# Patient Record
Sex: Female | Born: 1945 | Race: White | Hispanic: No | Smoking: Never smoker
Health system: Southern US, Community
[De-identification: ages and names within clinical notes are randomized; demographics above are authoritative.]

## PROBLEM LIST (undated history)

## (undated) DIAGNOSIS — E039 Hypothyroidism, unspecified: Secondary | ICD-10-CM

## (undated) DIAGNOSIS — F329 Major depressive disorder, single episode, unspecified: Secondary | ICD-10-CM

## (undated) DIAGNOSIS — D649 Anemia, unspecified: Secondary | ICD-10-CM

## (undated) DIAGNOSIS — M797 Fibromyalgia: Secondary | ICD-10-CM

## (undated) DIAGNOSIS — F419 Anxiety disorder, unspecified: Secondary | ICD-10-CM

## (undated) DIAGNOSIS — N189 Chronic kidney disease, unspecified: Secondary | ICD-10-CM

## (undated) DIAGNOSIS — F32A Depression, unspecified: Secondary | ICD-10-CM

## (undated) DIAGNOSIS — M1990 Unspecified osteoarthritis, unspecified site: Secondary | ICD-10-CM

## (undated) DIAGNOSIS — I10 Essential (primary) hypertension: Secondary | ICD-10-CM

## (undated) HISTORY — PX: APPENDECTOMY: SHX54

## (undated) HISTORY — PX: ABDOMINAL HYSTERECTOMY: SHX81

## (undated) HISTORY — PX: BACK SURGERY: SHX140

---

## 1989-03-25 HISTORY — PX: BREAST REDUCTION SURGERY: SHX8

## 2003-08-27 ENCOUNTER — Encounter
Admission: RE | Admit: 2003-08-27 | Discharge: 2003-11-25 | Payer: Self-pay | Admitting: Physical Medicine and Rehabilitation

## 2003-12-03 ENCOUNTER — Encounter
Admission: RE | Admit: 2003-12-03 | Discharge: 2004-03-02 | Payer: Self-pay | Admitting: Physical Medicine and Rehabilitation

## 2004-03-01 ENCOUNTER — Encounter
Admission: RE | Admit: 2004-03-01 | Discharge: 2004-04-13 | Payer: Self-pay | Admitting: Physical Medicine and Rehabilitation

## 2004-03-26 ENCOUNTER — Ambulatory Visit: Payer: Self-pay | Admitting: Physical Medicine & Rehabilitation

## 2004-04-13 ENCOUNTER — Encounter
Admission: RE | Admit: 2004-04-13 | Discharge: 2004-07-12 | Payer: Self-pay | Admitting: Physical Medicine and Rehabilitation

## 2004-04-14 ENCOUNTER — Inpatient Hospital Stay (HOSPITAL_COMMUNITY): Admission: EM | Admit: 2004-04-14 | Discharge: 2004-04-15 | Payer: Self-pay | Admitting: Emergency Medicine

## 2004-04-14 ENCOUNTER — Ambulatory Visit: Payer: Self-pay | Admitting: Physical Medicine and Rehabilitation

## 2004-04-15 ENCOUNTER — Encounter (INDEPENDENT_AMBULATORY_CARE_PROVIDER_SITE_OTHER): Payer: Self-pay | Admitting: Cardiology

## 2005-05-23 ENCOUNTER — Inpatient Hospital Stay (HOSPITAL_COMMUNITY): Admission: RE | Admit: 2005-05-23 | Discharge: 2005-06-01 | Payer: Self-pay | Admitting: Psychiatry

## 2005-05-23 ENCOUNTER — Encounter: Payer: Self-pay | Admitting: Emergency Medicine

## 2005-05-24 ENCOUNTER — Ambulatory Visit: Payer: Self-pay | Admitting: Psychiatry

## 2005-07-12 ENCOUNTER — Ambulatory Visit: Payer: Self-pay | Admitting: Family Medicine

## 2005-10-21 ENCOUNTER — Emergency Department (HOSPITAL_COMMUNITY): Admission: EM | Admit: 2005-10-21 | Discharge: 2005-10-21 | Payer: Self-pay | Admitting: Emergency Medicine

## 2005-10-24 ENCOUNTER — Inpatient Hospital Stay (HOSPITAL_COMMUNITY): Admission: RE | Admit: 2005-10-24 | Discharge: 2005-10-31 | Payer: Self-pay | Admitting: Psychiatry

## 2005-10-25 ENCOUNTER — Ambulatory Visit: Payer: Self-pay | Admitting: Psychiatry

## 2005-12-12 ENCOUNTER — Ambulatory Visit (HOSPITAL_COMMUNITY): Payer: Self-pay | Admitting: Psychiatry

## 2006-03-20 ENCOUNTER — Ambulatory Visit (HOSPITAL_COMMUNITY): Payer: Self-pay | Admitting: Psychiatry

## 2006-06-12 ENCOUNTER — Ambulatory Visit (HOSPITAL_COMMUNITY): Payer: Self-pay | Admitting: Psychiatry

## 2006-09-04 ENCOUNTER — Ambulatory Visit (HOSPITAL_COMMUNITY): Payer: Self-pay | Admitting: Psychiatry

## 2006-12-13 ENCOUNTER — Ambulatory Visit (HOSPITAL_COMMUNITY): Payer: Self-pay | Admitting: Psychiatry

## 2007-01-15 ENCOUNTER — Ambulatory Visit (HOSPITAL_COMMUNITY): Payer: Self-pay | Admitting: Psychiatry

## 2007-02-10 ENCOUNTER — Emergency Department (HOSPITAL_COMMUNITY): Admission: EM | Admit: 2007-02-10 | Discharge: 2007-02-10 | Payer: Self-pay | Admitting: Emergency Medicine

## 2007-03-05 ENCOUNTER — Ambulatory Visit (HOSPITAL_COMMUNITY): Payer: Self-pay | Admitting: Psychiatry

## 2007-04-18 ENCOUNTER — Encounter: Admission: RE | Admit: 2007-04-18 | Discharge: 2007-04-18 | Payer: Self-pay | Admitting: Neurosurgery

## 2007-06-06 ENCOUNTER — Inpatient Hospital Stay (HOSPITAL_COMMUNITY): Admission: RE | Admit: 2007-06-06 | Discharge: 2007-06-08 | Payer: Self-pay | Admitting: Neurosurgery

## 2007-07-23 ENCOUNTER — Encounter: Admission: RE | Admit: 2007-07-23 | Discharge: 2007-07-25 | Payer: Self-pay | Admitting: Neurosurgery

## 2007-07-26 ENCOUNTER — Encounter: Admission: RE | Admit: 2007-07-26 | Discharge: 2007-07-26 | Payer: Self-pay | Admitting: Neurosurgery

## 2008-05-05 ENCOUNTER — Encounter: Admission: RE | Admit: 2008-05-05 | Discharge: 2008-05-05 | Payer: Self-pay | Admitting: Neurosurgery

## 2008-07-29 ENCOUNTER — Inpatient Hospital Stay (HOSPITAL_COMMUNITY): Admission: RE | Admit: 2008-07-29 | Discharge: 2008-08-02 | Payer: Self-pay | Admitting: Neurosurgery

## 2008-12-02 ENCOUNTER — Encounter: Admission: RE | Admit: 2008-12-02 | Discharge: 2008-12-02 | Payer: Self-pay | Admitting: Neurosurgery

## 2009-01-02 ENCOUNTER — Inpatient Hospital Stay (HOSPITAL_COMMUNITY): Admission: RE | Admit: 2009-01-02 | Discharge: 2009-01-03 | Payer: Self-pay | Admitting: Neurosurgery

## 2010-08-15 ENCOUNTER — Encounter: Payer: Self-pay | Admitting: Neurosurgery

## 2010-11-01 LAB — CBC
HCT: 33.7 % — ABNORMAL LOW (ref 36.0–46.0)
MCHC: 33.3 g/dL (ref 30.0–36.0)
RBC: 3.76 MIL/uL — ABNORMAL LOW (ref 3.87–5.11)
RDW: 14.4 % (ref 11.5–15.5)
WBC: 6.6 10*3/uL (ref 4.0–10.5)

## 2010-11-01 LAB — BASIC METABOLIC PANEL
BUN: 20 mg/dL (ref 6–23)
CO2: 27 mEq/L (ref 19–32)
Calcium: 9.1 mg/dL (ref 8.4–10.5)
Chloride: 105 mEq/L (ref 96–112)
Glucose, Bld: 78 mg/dL (ref 70–99)

## 2010-11-08 LAB — TYPE AND SCREEN: Antibody Screen: NEGATIVE

## 2010-11-08 LAB — ABO/RH: ABO/RH(D): A POS

## 2010-11-11 IMAGING — CT CT CERVICAL SPINE W/O CM
4 of 5 series · 15 of 33 positions shown, 18 images · non-contrast
Comparison: Radiographs 12/02/2008

CLINICAL DATA: Neck pain

CT CERVICAL SPINE WITHOUT CONTRAST
TECHNIQUE: Multidetector CT imaging of the cervical spine was
performed. Multiplanar CT image reconstructions were also
generated.

[Series 4: bone windows · axial · 0.23mm/px · z∈[+118,+174]mm · 2 of 135 slices shown]
[im 45/135  bone]
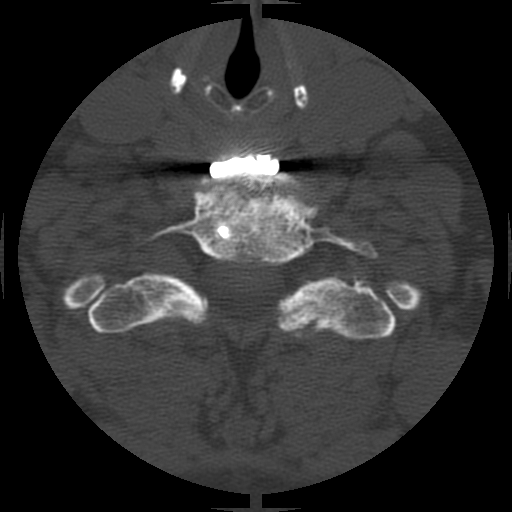
[im 90/135  bone]
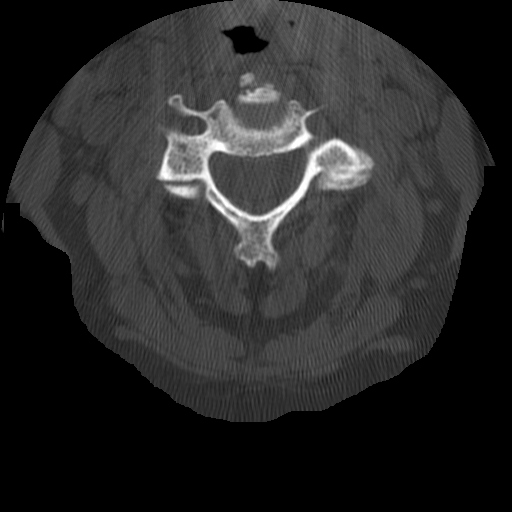

[Series 200: coronal · coronal · 0.34mm/px · 3 of 40 slices shown]
[im 8/40  bone]
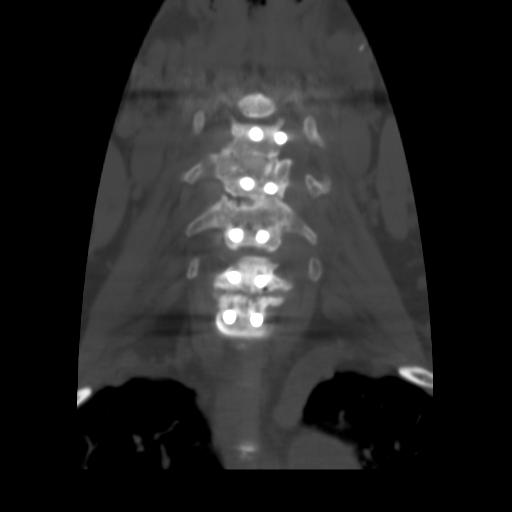
[im 16/40  bone]
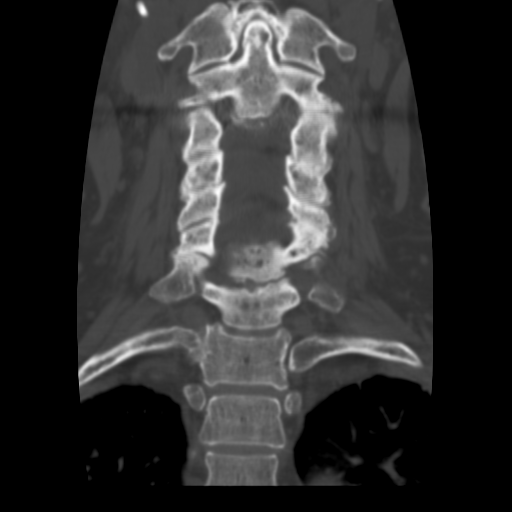
[im 24/40  bone]
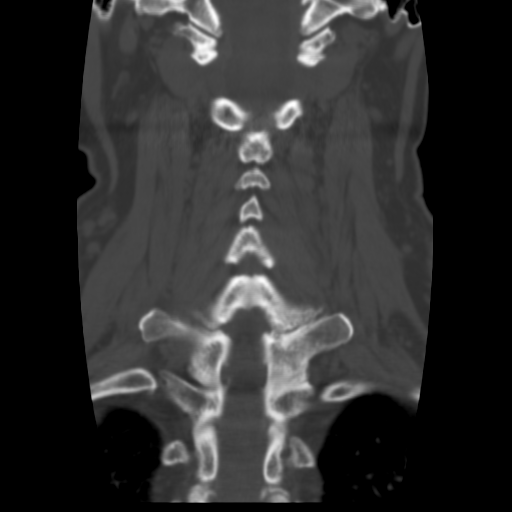

[Series 201: sagittal · sagittal · 0.34mm/px · 5 of 40 slices shown, 6 images]
[im 14/40  bone]
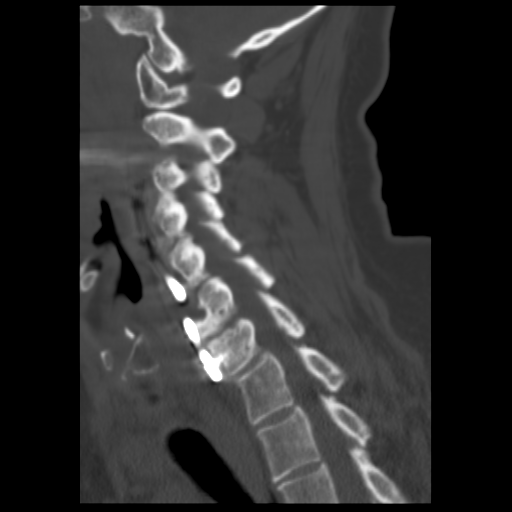
[im 17/40  bone]
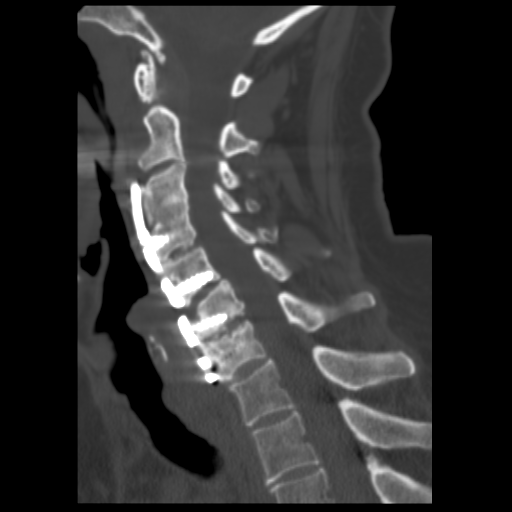
[im 20/40  soft-tissue]
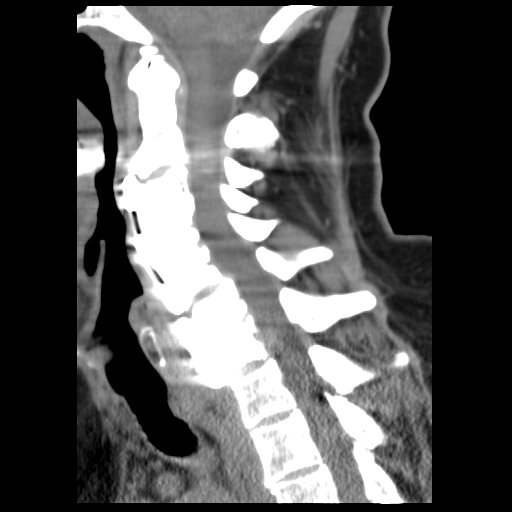
[im 20/40  bone]
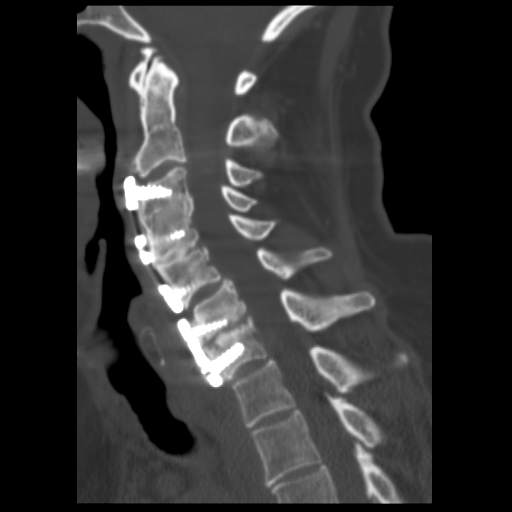
[im 23/40  bone]
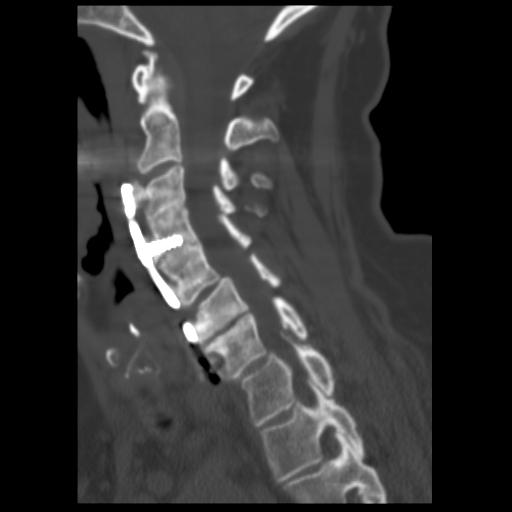
[im 27/40  bone]
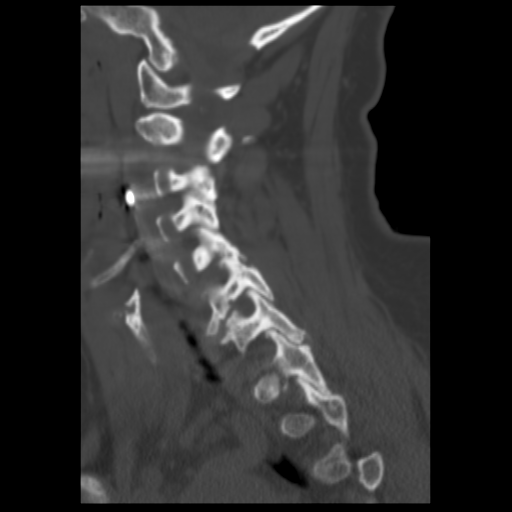

[Series 202: angled axial · axial · 0.20mm/px · z∈[+70,+179]mm · 5 of 248 slices shown, 7 images]
[im 42/248  soft-tissue]
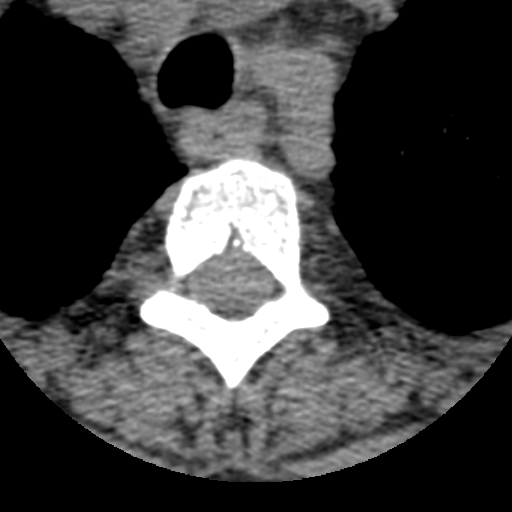
[im 42/248  bone]
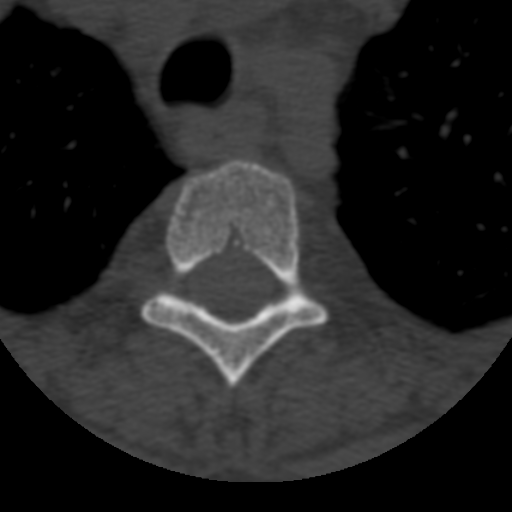
[im 83/248  bone]
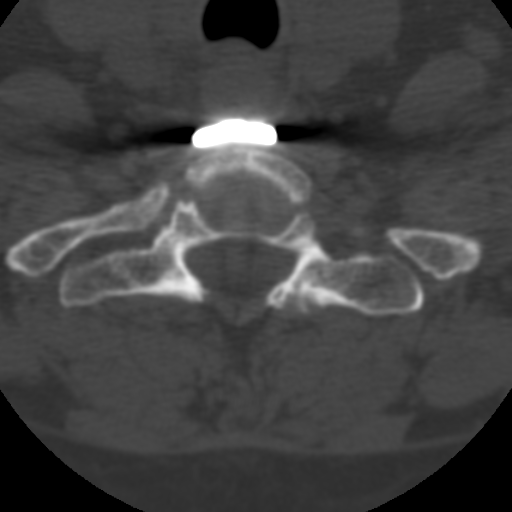
[im 124/248  bone]
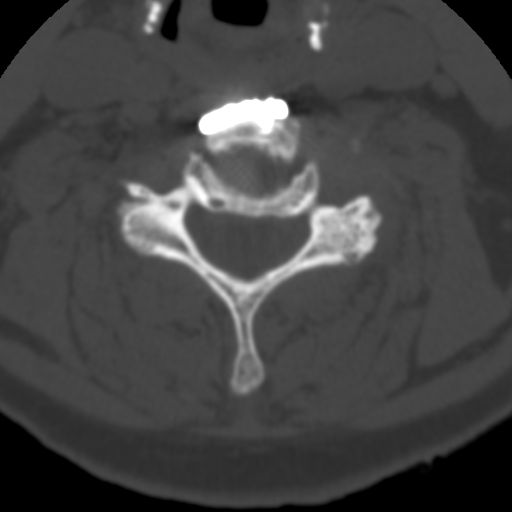
[im 165/248  bone]
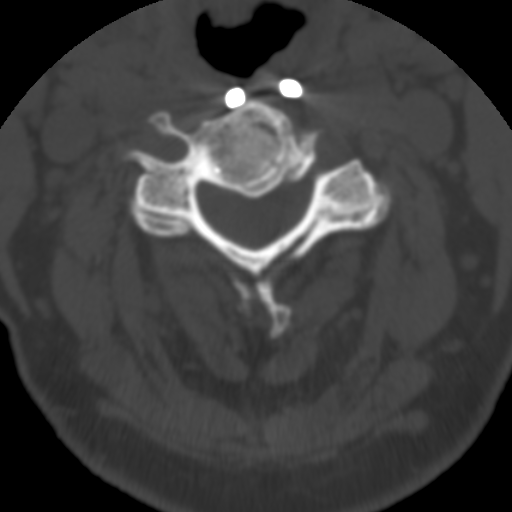
[im 206/248  soft-tissue]
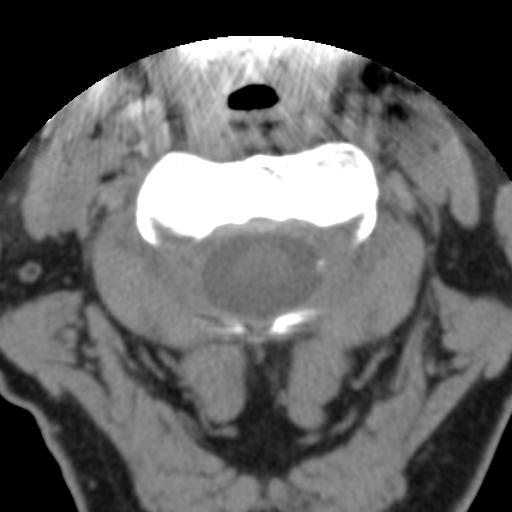
[im 206/248  bone]
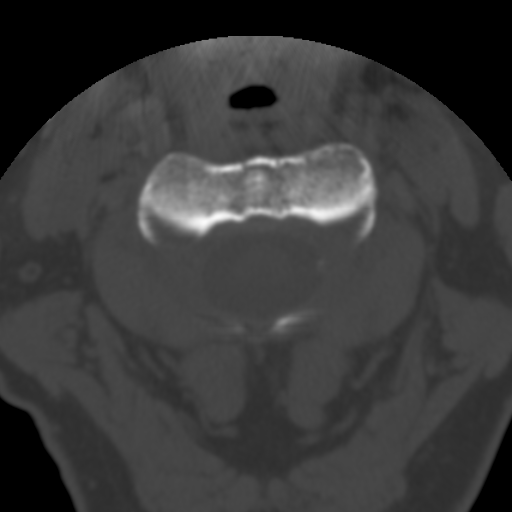

[15 of 33 positions shown; findings below may reference images not displayed]

FINDINGS: The patient has undergone cervical fusion, C3-C4, C4-C5
with anterior screw plate fixation.  The hardware is intact without
evidence for loosening.  Interbody bone plug at C3-C4 is completely
incorporated.  Incomplete bony incorporation of the bone plug at C4-
C5.

Cervical fusion is also identified at C6-C7.  The screws are well
positioned without loosening.  No bone plug is identified.

Diffuse facet degenerative changes.  No appreciable osseous
foraminal encroachment.

Axial imaging: The individual disc spaces were examined as follows:

C2-3: Normal

C3-4: Hardware well positioned.  No central or foraminal stenosis

C4-5: Fusion hardware satisfactory position.  Facet hypertrophic
changes.  No appreciable foraminal stenosis

C5-6: Uncinate spurring with facet degenerative changes.  Mild left
foraminal narrowing secondary to facet hypertrophy.

C6-7: Uncinate spurring without stenosis.

C7-T1: Mild facet degenerative change.
IMPRESSION: Fusion hardware in satisfactory position.  Incomplete bony
incorporation interbody bone plug at C4-C5.

No bone plug is identified at C6-C7.

## 2010-12-07 NOTE — H&P (Signed)
NAME:  Christine Burns, Christine Burns                  ACCOUNT NO.:  [id number]   MEDICAL RECORD NO.:  [id number]          PATIENT TYPE:  INP   LOCATION:  3018                         FACILITY:  [facility]   PHYSICIAN:  Hilda Lias, M.D.   DATE OF BIRTH:  13-Sep-1945   DATE OF ADMISSION:  06/06/2007  DATE OF DISCHARGE:                              HISTORY & PHYSICAL   HISTORY:  Ms. Christine Burns is a lady who was seen by me almost 2 years ago  complaining of neck pain and also lower back pain.  The pain goes to the  right leg.  It radiates up to her neck, and the pain goes to both arms,  left worse than the right one.  She has increase of pain with flexion of  the cervical spine.  Because of that, we went ahead and did an MRI which  was not helpful, and we proceed with a cervical myelogram and because of  the findings, she is being admitted for surgery.   PAST MEDICAL HISTORY:  She had femoral bypass.   ALLERGIES:  She is allergic to DURAGESIC, VICODIN, AMBIEN, and  NEURONTIN.   FAMILY HISTORY:  Unremarkable.   REVIEW OF SYSTEMS:  Positive high blood pressure, neck pain, back pain,  anxiety, depression, thyroid disease, and fibromyalgia.   PHYSICAL EXAMINATION:  HEAD, EARS, NOSE AND THROAT:  Normal.  NECK:  She has a decreased flexibility of the cervical spine secondary  to pain.  LUNGS:  Clear.  HEART:  Sounds normal.  EXTREMITIES:  Normal pulses.  NEURO:  Mental status normal, cranial nerves normal.  Strength:  She has  weakness  of the deltoid and the triceps.  Reflexes 1+.  No Babinski.  Pinprick  sensation both arms.  The cervical MRI myelogram showed that  she has a spondylosis at the level of C3-4, C4-5 and C6-7.  C5-6 is  completely normal.   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   RECOMMENDATIONS:  The patient wants to proceed with surgery.  The  procedure will be a decompression at the level of C3-4, C4-5, and C6-7,  skipping C5-6.  She knows about the risks  such as infection, CSF leak,  damage to the vocal cord, stroke, paralysis, and need for surgery.           ______________________________  Hilda Lias, M.D.     EB/MEDQ  D:  06/06/2007  T:  06/07/2007  Job:  [id number]

## 2010-12-07 NOTE — Op Note (Signed)
NAME:  Christine Burns, Christine Burns                  ACCOUNT NO.:  [id number]   MEDICAL RECORD NO.:  [id number]          PATIENT TYPE:  INP   LOCATION:  3010                         FACILITY:  [facility]   PHYSICIAN:  Hilda Lias, M.D.   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   PROCEDURE:  Bilateral L2, L3, L4, L5 laminectomies, foraminotomy,  decompression of the thecal sac, posterolateral arthrodesis with  autograft.   SURGEON:  Hilda Lias, MD   ASSISTANT:  Danae Orleans. Venetia Maxon, MD   CLINICAL HISTORY:  Mrs. Bambach is a 65 year old female complaining of  back pain with radiation to both the legs.  X-rays show severe stenosis  from L2-L3 down to L5-S1.  Surgery was advised.  She has failed with  conservative treatment.   PROCEDURE:  The patient was taken to the OR and after intubation, she  was positioned in a prone manner.  The skin was cleaned with DuraPrep.  We were able to count the spinous process and midline incision was done  from L2 down to L5-S1.  Muscle was retracted all the way laterally to  the junction between the facet and the transverse process.  Then with  the [place], we removed the spinous process of L5, L4, L3, and L2.  With  the drill, we did bilateral laminectomies preserving the facet.  Immediately, we found that the patient has only stenosis, but taking up  the yellow ligament compromising the canal.  Removal of the yellow  ligament was accomplished and at the end using the L2, L3, L4 related to  Kerrison punch, we did a foraminotomy to decompress the L2 down to L5-S1  nerve root.  Good hemostasis was achieved.  Then with the drill, we  drilled the lateral aspect of the facet from L2-3, L3-4, L4-5, and L5-  S1.   Autograft was used for arthrodesis.  Valsalva maneuver was  negative.  The area was irrigated.  Fentanyl was left in the epidural  space and the wound was closed with Vicryl and Steri-Strips.           ______________________________  Hilda Lias, M.D.     EB/MEDQ  D:  07/29/2008  T:  07/30/2008  Job:  [id number]

## 2010-12-07 NOTE — H&P (Signed)
NAME:  Christine Burns                  ACCOUNT NO.:  [id number]   MEDICAL RECORD NO.:  [id number]          PATIENT TYPE:  INP   LOCATION:  2899                         FACILITY:  [facility]   PHYSICIAN:  Hilda Lias, M.D.   DATE OF BIRTH:  1945/10/04   DATE OF ADMISSION:  01/02/2009  DATE OF DISCHARGE:                              HISTORY & PHYSICAL   Christine Burns is a lady who underwent cervical fusion at the level of 3-4, 4-  5, and 6-7.  Later on she had lumbar decompression.  For the past few  months, she had been complaining of neck pain radiation to the shoulder  and the pain gets worse every time she moved.  She had failed  conservative treatment.  X-rays showed that the area where she had  fusion at the level C3-C4 and C4-C5, and at the C6-C7 she developed  pseudoarthrosis.  Because of continuous pain, she is being admitted for  surgery.   PAST MEDICAL HISTORY:  Lumbar laminectomy, anterior cervical fusion,  femoral bypass.   ALLERGIES:  She is allergic to AMBIEN, VICODIN, DURAGESIC PATCH,  MORPHINE, VALIUM, NEURONTIN.   FAMILY HISTORY:  Unremarkable.   REVIEW OF SYSTEMS:  Positive for high blood pressure, anxiety,  depression, thyroid disease, fibromyalgia.   PHYSICAL EXAMINATION:  HEAD, EYES, EARS, NOSE, AND THROAT:  Normal.  NECK:  She has anterior scar __________ going to the shoulder.  LUNGS:  Clear.  HEART:  Sounds normal.  __________ pulse.  She has a scar in the lumbar area.  NEURO:  She is oriented x3.  There is no weakness.  Reflex is  symmetrical.   X-rays showed that she has pseudoarthrosis at the level C6-7.   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  Status post fusion 3-4, 4-5.  Lumbar laminectomy for neurogenic claudication.  For this, the patient  is being admitted for surgery.  Procedure will be removal of the  __________ fusion.  We tried to use Apligraf in her hip and she  declined.  The surgery was fully explained to her with the same risks as  before  including the possibility of no improvement.           ______________________________  Hilda Lias, M.D.     EB/MEDQ  D:  01/02/2009  T:  01/02/2009  Job:  [id number]

## 2010-12-07 NOTE — Op Note (Signed)
NAME:  Christine Burns, Christine Burns                  ACCOUNT NO.:  [id number]   MEDICAL RECORD NO.:  [id number]          PATIENT TYPE:  INP   LOCATION:  3017                         FACILITY:  [facility]   PHYSICIAN:  Hilda Lias, M.D.   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   PROCEDURES:  Removal of the previous C6-7 plate, takedown of the  pseudoarthrosis at the level of C6-7, fusion with 9-mm allograft with  autograft inside plus BMX plate, Z6-1, microscope.   SURGEON:  Hilda Lias, MD   CLINICAL HISTORY:  Ms. Grosvenor is a lady who underwent 3-4, 4-5, and 6-7  fusion about 3 years ago.  The patient did well, but lately she had been  complaining of neck pain mostly with activity.  X-rays shows solid  fusion at the level of 3-4 and 4-5.  She developed pseudoarthrosis at  the level of C6-7.  Surgery was advised in view of no improvement.   PROCEDURE:  The patient was taken to the OR and after intubation, the  left side of the neck was cleaned with DuraPrep.  Transverse incision  following the previous wound was made through the skin, subcutaneous  tissue, and platysma.  Dissection was carried out all the way down until  we found the plate at the level of C6-7.  Lysis of adhesions was done.  The plate and the 4 screws were removed.  Then, with the help of the  microscope and doing an x-ray, we localized the area of pseudoarthrosis.  Indeed, there was quite a bit of fibrosis and no bone activity  whatsoever.  With the drill as well as the microcurette,  we were able  to remove the whole fibrosis all the way down to the spinal cord.  Foraminotomy was accomplished.  Then, the endplates were drilled until  we were able to see fresh bleeding coming  through.  Then, an allograft  of 9 mm, lordotic with autograft inside as well as BMX was inserted, and  this was followed by using a new plate and 4 screws.  Lateral cervical  spine showed good position of the plate and screws.  Once we  accomplished hemostasis, the wound was closed with Vicryl and Steri-  Strips.  The patient is going to go to PACU and she is going to be  wearing Aspen brace for at least 8 weeks.           ______________________________  Hilda Lias, M.D.     EB/MEDQ  D:  01/02/2009  T:  01/02/2009  Job:  [id number]

## 2010-12-07 NOTE — H&P (Signed)
NAME:  Yackel, Rosealee                  ACCOUNT NO.:  [id number]   MEDICAL RECORD NO.:  [id number]          PATIENT TYPE:  INP   LOCATION:  3010                         FACILITY:  [facility]   PHYSICIAN:  Hilda Lias, M.D.   DATE OF BIRTH:  01/31/1946   DATE OF ADMISSION:  07/29/2008  DATE OF DISCHARGE:                              HISTORY & PHYSICAL   Ms. Ronning is a lady who had been coming to my office complaining of back  pain with pain going to both her legs, especially with walking.  At the  point that when she walks about a block she had to sit, immediately  develop quite a bit of pain in both legs.  This lady in the past  underwent anterior decompression of the cervical area.  By x-ray, we  know that she has stenosis, which was severe from T4 down to L5-S1.   PAST MEDICAL HISTORY:  Anterior cervical decompression.  Femoral bypass.   ALLERGIES:  The patient is allergic to VICODIN, DURAGESIC, AMBIEN, and  NEURONTIN.   FAMILY HISTORY:  Unremarkable.   REVIEW OF SYSTEMS:  Positive for high blood pressure, back pain,  anxiety, thyroid disease, fibromyalgia.   PHYSICAL EXAMINATION:  HEAD, EARS, NOSE and THROAT:  Normal.  NECK:  She has anterior scar in the neck.  LUNGS:  Clear.  HEART:  Heart sounds normal.  EXTREMITIES: There were normal strength and normal pulse.  NEUROLOGICAL:  She has a weakness which is in both legs with femoral  stretch maneuver, positive bilateral, lumbar spine x-rays showed severe  stenosis at T3-4, T4-5, and T5-1.   IMPRESSION:  Lumbar stenosis with neurogenic claudication.   RECOMMENDATIONS:  The patient will be admitted for surgery.  The  procedure would be bilateral T4-5 laminectomy, foraminotomy, and  possible posterolateral arthrodesis with autograft.  The patient knows  about the risks of infection, CSF leak, worsening pain, paralysis, need  for surgery.           ______________________________  Hilda Lias, M.D.     EB/MEDQ  D:   07/29/2008  T:  07/29/2008  Job:  [id number]

## 2010-12-07 NOTE — Discharge Summary (Signed)
NAME:  Christine Burns, Christine Burns                  ACCOUNT NO.:  [id number]   MEDICAL RECORD NO.:  [id number]          PATIENT TYPE:  INP   LOCATION:  3017                         FACILITY:  [facility]   PHYSICIAN:  Coletta Memos, M.D.     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   PROCEDURE:  Redo fusion at C6-7 with anterior instrumentation and  structural autograft.   COMPLICATIONS:  None.   DISCHARGE STATUS:  Alive and well.   Wound clean and dry, no signs of infection.  Strength 5/5 in both upper  and lower extremities.  She is voiding, ambulating, and tolerating a  regular diet without difficulty.  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   DISCHARGE DESTINATION:  Home.   [name] is given instructions as to no heavy lifting, bending, or  strenuous activity involving her neck.  She will call the office to make  an appointment with Dr. Jeral Fruit for followup.           ______________________________  Coletta Memos, M.D.     KC/MEDQ  D:  01/03/2009  T:  01/03/2009  Job:  [id number]

## 2010-12-07 NOTE — Op Note (Signed)
NAME:  Christine Burns, Christine Burns                  ACCOUNT NO.:  [id number]   MEDICAL RECORD NO.:  [id number]          PATIENT TYPE:  INP   LOCATION:  3018                         FACILITY:  [facility]   PHYSICIAN:  Hilda Lias, M.D.   DATE OF BIRTH:  [birth year]   DATE OF PROCEDURE:  06/06/2007  DATE OF DISCHARGE:                               OPERATIVE REPORT   PREOPERATIVE DIAGNOSES:  C3-4, C4-5 and C6-7 spondylosis, foraminal  narrowing, chronic radiculopathy.  Fibromyalgia.   POSTOPERATIVE DIAGNOSES:  C3-4, C4-5 and C6-7 spondylosis, foraminal  narrowing, chronic radiculopathy.  Fibromyalgia.   PROCEDURE:  Anterior 3-4, 4-5, and 6-7 diskectomy and decompression of  spinal cord, bilateral foraminotomy, interbody fusion with allograft  plate and C3 to C5 and C6 to C7.  Microscope.   SURGEON:  Hilda Lias, M.D.   ASSISTANT:  Stefani Dama, M.D.   CLINICAL HISTORY:  Ms. Paulsen is a lady with complaint of neck pain,  radiation to both upper extremities.  The patient has fibromyalgia.  X-  rays showed severe spondylosis at the level of 3-4, 4-5, and 6-7 with a  normal level of 5-6.  Surgery was advised and risks were explained in  the history and physical.   PROCEDURE:  The patient was taken to the OR and she was positioned in a  supine manner.  The left side of neck was cleaned with DuraPrep.  Transverse incision was made through the skin and subcutaneous tissue  down to the cervical spine.  X-ray was done and one needle was at the  level of 4-5 and the other was at the level of 6-7.  From then on we  brought the microscope into the area and the anterior ligament at the  level 3-4, 4-5, and 6-7 was opened.  The patient had large osteophyte  which was removed.  We started at the level of C3-C4 where we did a  total diskectomy.  The patient had quite a bit of narrowing of the canal  and the foramen.  Using the 1 and 2 mm Kerrison punch as well as the  drill, we decompressed the spinal cord as  well as the nerve root.  The  same procedure was done at the level of 4-5 with the same finding.  The  worst was level of C6-7 where she had quite a bit of narrowing of the  canal with almost complete adhesion between the posterior ligament and  the dura mater.  Lysis was accomplished.  We did dissection and in the  end we had good decompression of both C7 nerve root as well as the  spinal cord.  Then three allografts of __________  were inserted.  Then  from C3 to C5 we used six screws.  At the level of C6-C7 used 4 screws  and a plate.  X-rays showed good alignment with a normal position of the  bone graft and the plate.  From then on the area was irrigated.  Hemostasis was accomplished.  Nevertheless because the large dissection  a drain was left.  The wound was closed with  Vicryl and Steri-Strips.           ______________________________  Hilda Lias, M.D.     EB/MEDQ  D:  06/06/2007  T:  06/07/2007  Job:  [id number]

## 2010-12-10 NOTE — H&P (Signed)
NAME:  Christine Burns, Christine Burns                  ACCOUNT NO.:  [id number]   MEDICAL RECORD NO.:  [id number]          PATIENT TYPE:  INP   LOCATION:  1826                         FACILITY:  [facility]   PHYSICIAN:  Melissa L. Ladona Ridgel, MD  DATE OF BIRTH:  [birth year]   DATE OF ADMISSION:  04/14/2004  DATE OF DISCHARGE:                                HISTORY & PHYSICAL   CHIEF COMPLAINT:  Chest pain and migraine.   PRIMARY CARE PHYSICIAN:  Dr.  Selinda Flavin  at [facility] in [place].   HISTORY OF PRESENT ILLNESS:  The patient is a 65 year old white female with  a past medical history for fibromyalgia, chronic pain, migraines,  hypothyroidism, who states that she has not been feeling well all week.  What she means by this is that she has been nauseated, unable to keep her  food down.  She describes a headache that is consistent with a migraine.  She states that approximately two weeks ago she was started on a Duragesic  patch and since then has been unable to eat well and has been complaining of  these migraines.  She, therefore, feels that the current symptoms that she  is having namely chest pain, nausea, vomiting, and headache are related to  not being able to take her Celexa and that her hypertension noted yesterday  and today are related to not being able to take her atenolol.  She states  that she discontinued her Duragesic patch yesterday.   Today, she presented to her primary pain management clinic appointment, and  told them about the chest tightness that she was feeling.  She states that  it comes and it goes.  It is not associated with diaphoresis, nausea or  vomiting.  Since she has already been nauseous and vomiting, it is  difficulty to say that there is a temporal relationship.  But she did notice  yesterday that her blood pressure was 200/150 and that this was a new  sensation for her.  She does relate that occasionally with her fibromyalgia  she will have some chest discomfort and this might  be similar.  In the  doctor's office, the patient's blood pressure was found to be elevated and  her EKG appeared to have abnormal ST-T wave changes.  She was also noted to  be bradycardic.  She was, therefore, transferred to the emergency room for  further evaluation.   PAST MEDICAL HISTORY:  1.  The patient suffers from acute fibromyalgia.  2.  Hyperthyroidism.  3.  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  4.  Hypertension.  5.  A benign tremor.  6.  Migraines.  7.  Seasonal allergies.  8.  She recently had an abscessed tooth for which she was given antibiotics.   PAST SURGICAL HISTORY:  Hysterectomy in 1982.   SOCIAL HISTORY:  She denies tobacco or ethanol.   FAMILY HISTORY:  Mom is deceased secondary to coronary artery disease.  Dad  is deceased of unknown reasons.  She has two children a son and a daughter.  She previously worked as a  nurse on the general medical ward.   REVIEW OF SYSTEMS:  No fevers, no chills, positive nausea and vomiting  related to her migraine, no dysuria, no melena, no hematochezia.  All  other review of systems are negative.   MEDICATIONS:  1.  She currently is not taking the Duragesic patch.  2.  She is on Synthroid 0.112 mcg a day.  3.  Restoril, she takes 60 mg two 30 mg tablets at bedtime for the last      three years.  4.  Hydrocodone two tablets p.o. t.i.d. had been her previous pain regimen,      but she was recently switched to Duragesic.  5.  She takes a multivitamin a day.  6.  Over the counter antihistamine.  7.  She takes Atenolol 50 mg p.o. b.i.d.  8.  Celexa 40 mg every day.   PRIMARY PAIN MANAGEMENT SPECIALIST:  Dr. Merrie Roof, [phone].   ALLERGIES:  1.  AMBIEN which caused confusion.  2.  PHENERGAN which also caused confusion.   VITAL SIGNS:  Temperature is 97, blood pressure 142/75, pulse is 56,  respiratory rate is 18.  She is 98% on room air.   PHYSICAL EXAMINATION:  GENERAL:  She is in no acute distress but does have  an  intention tremor.  HEAD/NECK:  Normocephalic, atraumatic.  Pupils are equal, round, reactive to  light.  Extraocular muscles are intact.  Mucous membranes are moist.  Neck  is supple.  There is no JVD, no lymph nodes, and no carotid bruits.  CHEST:  There are crackles at the right base which clear with deep  inspiration.  There are otherwise no rhonchi or wheezes.  CARDIOVASCULAR:  Regular rate and rhythm.  Positive S1 S2.  No S3 S4.  No  murmurs, rubs or gallops.  ABDOMEN:  Soft, nontender, nondistended with positive bowel sounds.  EXTREMITIES:  No clubbing, cyanosis, or edema.  She has 2+ pulses.  NEUROLOGIC:  Power is 5/5.  DTRs are 2+ plus there is an intentional tremor.  Cranial nerves II-XII are intact.  There is no nystagmus.   LABORATORY:  Reveals a white count of 5.1, hemoglobin of 13.5, hematocrit of  38.9, platelets are 335.  Her sodium is 131. Her is potassium 3.1. Her  chloride is 95.  Her CO2 is 25.  Her BUN is 10.  Creatinine is 0.9.  Glucose  is 137.  Her LFTs are within normal limits.  Point-of-care enzymes are  negative x 2.   EKG reveals a heart rate of 54 with flattened T wave changes that appear  inverted.  Her QTC is 474.   ASSESSMENT:  This is a 65 year old white female with a past medical history  for fibromyalgia, chronic pain, and depression, who presented to her primary  pain clinic appointment today with hypertension and the complaint of chest  pain.  She will be admitted to telemetry bed to rule out myocardial  infarction.  I suspect that this pain is not cardiac related but may be  related to her fibromyalgia.  She has been unable to take her Celexa and  blood pressure medication secondary to nausea related to a migraine which  she equates to starting Duragesic and therefore, she has uncontrolled  hypertension at this time.   PLAN:  1.  Cardiovascular.  She is low suspicion for cardiac chest pain, but we     will start aspirin at this time and rule her  out for myocardial  infarction.  When the patient is able to take oral medications, we will      resume her atenolol.  I will not add Lopressor at this time or Lovenox      unless her cardiac enzymes become positive.  We will resume her atenolol      when she is able to take oral medications and use hydralazine p.r.n. for      increased blood pressure.  We will check a 2D echo to look at end organ      disease and consider a cardiac consult if her enzymes show positive.  2.  Pulmonary.  Her chest x-ray is clear.  There are no active issues there.  3.  GI.  We will start IV Protonix and start her off on a clear liquid diet      and advance it as tolerated.  We will use Compazine IV for management of      her nausea.  4.  GU.  We will check UA C&S to rule out occult infection as a source for      her nausea and vomiting.  5.  Endocrine.  We will check a hemoglobin A1c since she has an elevated      blood glucose and a TSH to monitor her Synthroid effectiveness.  6.  DVT prophylaxis will be with ambulation.  7.  Neurologic.  We will check a head CT to rule out possible intracranial      bleed.        MLT/MEDQ  D:  04/14/2004  T:  04/14/2004  Job:  [id number]   cc:   Selinda Flavin  [address]  [place]  [place] [address]  Fax: [phone]

## 2010-12-10 NOTE — Assessment & Plan Note (Signed)
MEDICAL RECORD NUMBER:  [id number]   Christine Burns is a 65 year old women with a history of cervical and lumbar  spondylosis.  She also has a history of chronic low back pain, bilateral  lower extremity paresthesias.  She also has a history of some left upper  extremity paresthesias.   Over the last 3 to 4 weeks Christine Burns has noted a decline in her function as  well as a worsening of her neck and arm pain.  She initially had just some  paresthesias into the upper extremity, over the last several weeks now she  has been having fairly constant neck and arm pain.  She rates her neck as  about a 7 on a scale of 10, and the arm pain is about a 6 on a scale of 10.  She also gets some headaches as well.  Arm pain is fairly constant, it goes  into the shoulder and to about the elbow and is a constant throbbing,  burning type pain.  Her back pain she rates as a 7 or an 8 on a scale of 10  and that is worsened by activity.  She has seen a surgeon in the past, Dr.  Lyn Hollingshead who saw her last on May 10, 2003 and felt she was not a  surgical candidate at that time.  Her last MRI was done on October 07, 2002  which showed disk bulges at C5-6 as well as C6-7 with mild to moderate  neural foraminal narrowing from C3 through C6.   Christine Burns notes that she has had a decline in her functional status.  She  used to enjoy doing various activities with her husband as well as doing  some gardening outside.  She has been unable to kind of golfing or gardening  for approximately a year.  She reports she is able to walk about 30 minutes  a day; she has some dogs that she takes out.  She is able to sweep, do  laundry, does some light cooking, spends a good deal of her day in sedentary  activity such as reading.  She is still able to drive but drives only within  [place].   Overall she is somewhat depressed; denies harm to self or others.   INTOLERANCE'S TO MEDICATIONS:  NEURONTIN AND AMITRIPTYLINE have been  tried  in the past both of them worsen her essential tremor.   PAST MEDICAL HISTORY:  1. Depression.  2. Thyroid disease.  3. Fibromyalgia.  4. Osteoarthritis.  5. Hypertension.  6. Essential tremor.   CURRENT MEDICATIONS:  1. A vitamin.  2. Celexa 40 daily.  3. Atenolol 50 b.i.d.  4. Synthroid daily.  5. Restoril 30 mg 2 p.o. q.h.s.  6. Soma 350 1 p.o. b.i.d. p.r.n.  7. MS Contin 15 mg 1 p.o. t.i.d.   PHYSICAL EXAM TODAY:  VITAL SIGNS:  She has a blood pressure of 156/90,  pulse 62, respiration 18, 95% saturated on room air.  GENERAL:  She is a well-developed, elderly woman who does not appear in  distress, however, does appear anxious and depressed.  NECK:  Range of motion is limited in all planes with increased pain with  rotation to the right, and right lateral flexion.  Pain is noted on the left  side of her neck with these particular movements.  Again, decreased range of  motion in all planes.  HEART:  Regular rhythm.  LUNGS:  Clear.  ABDOMEN:  Benign. Bowel sounds positive.  EXTREMITIES:  Reveal tremor worse on the right than on the left. No edema is  noted.  MUSCULOSKELETAL EXAMINATION:  Gait is within normal limits.  She has limited  lumbar flexion to about 50 to 60 degrees, left lateral flexion mildly  limited at about 5 degrees bilaterally.  Reflexes are 2+ at the biceps,  triceps, brachioradialis, 2+ at the knees and patellar tendons.  Toes are  down going, I do not appreciate any clonus.  Romberg's test is negative.   Motor strength bilaterally 5/5, shoulder abductors 5/5, biceps 5/5, triceps  5/5.  Wrist extensors, finger flexors and intrinsics, external rotation on  the left was 4 to 4+/5 versus  5/5 on the right.   Decreased sensation to pin prick in the left C5 and C6 dermatomes.   IMPRESSION:  1. Cervical spondylosis.  2. Cervicalgia.  3. Cervical radiculopathy with involvement of possible left C5 or C6 nerve     root.  4. Lumbar spondylosis.    PLAN:  1. Refill Soma 1 p.o. b.i.d. afternoon and night.  2. MS Contin 15 mg 1 p.o. t.i.d.  3. Restoril 30 mg 2 q.h.s.   With a decline in her function status and increased pain scores, will go  ahead and obtain an MRI of her cervical spine to evaluate for possible left  C5 or C6 root involvement.  Will see her back after the scan.  At that point  she will decide which surgeon she might like to follow up with.  She is very  anxious to have a Careers adviser evaluate her.  We did talk about possibility of  doing selective nerve root block with her as well.  May also add  antiinflammatory agent.  Will see her back in 1 month.      Brantley Stage, M.D.   DMK/MedQ  D:  01/14/2004 16:50:22  T:  01/15/2004 07:33:24  Job #:  [id number]

## 2010-12-10 NOTE — Assessment & Plan Note (Signed)
Ms. Hedgecock returns today after I last saw her for right L4 transforaminal  epidural sternoid injection.  This did produce improvement of her right  lower extremity pain, at least the severe component of it and lasted for  approximately three weeks.  She is here for reinjection.  No anticoagulant  treatment, however, blood pressure preinjection measured and confirmed at  195/145 in bilateral upper extremities.  Because of this repeated elevated  blood pressure which I think exceeds what is customary even in someone who  is in pain, we have directed her to follow up with her primary care  physician.  We have called Dr. Jeannette How office and informed him of the  elevated blood pressure.  She is on atenolol and Lasix, which she states she  took this morning.  We will not do the injection today.  We will reschedule  her for two weeks.      Erick Colace, M.D.   AEK/MedQ  D:  11/10/2003 12:04:17  T:  11/10/2003 16:15:37  Job #:  [id number]   cc:   Selinda Flavin  [address]  [place]  [place] [address]  Fax: [phone]

## 2010-12-10 NOTE — Assessment & Plan Note (Signed)
Christine Burns is a 65 year old woman with a history of cervical spondylosis,  lumbar spondylosis, and she has a history of chronic low back pain with  bilateral lower extremity paresthesias. She also had a history of some left  upper extremity paresthesias.   Her back pain began approximately in the year 2000. It has been progressive,  and she reports that in the last six months it has been even worse. She was  seen by me initially in February of 2005. At that time, she was set up for a  lumbar epidural steroid injection. She had reported that she had had success  with these in the past with Dr. Lew Dawes. She underwent a right L4  transforaminal epidural injection on March 14. At that time, her pain had  been a 9 to a 10 on a scale of 10 and had gone down to a 6 on a scale of 10.  She reported she felt it helped quite a bit, and she did well for several  weeks after the injection, and approximately two weeks ago in mid April, she  insidiously developed some increasing low back pain. She comes today and  reports that her back pain is a 10 on a scale of 10, and she has had many  sleepless nights. She had attempted to come in to get a repeat epidural  steroid injection earlier this month; however, her blood pressure was  199/149, and she was referred to primary care doctor for evaluation and  treatment of her hypertension. She was seen by her primary care physician,  and during that period, she had continued increased low back pain and was  then referred to her neurosurgeon, Dr. Lyn Hollingshead, who has apparently ordered  a MRI for tomorrow morning November 20, 2003 at 11 a.m.   She reports that her increased low back pain was not associated with any  fevers or chills. She has not had any numbness or tingling or weakness. She  had not had any problems controlling her bowel or bladder. No abdominal  symptoms are associated with it. She up until that point had been rather  active. She had been washing  clothes, walking the dog, had not really done  any yard work. She cannot think of anything that brought on her back pain.  She denies that she has had any trauma or falls or any other injuries. Has  not increased her activities of daily living in any way. Has done any  significant gardening or anything like that that might have provoked it.   CURRENT MEDICATIONS:  1. Multivitamin.  2. Lasix.  3. Phenergan.  4. K-Lor.  5. Celexa 40 one q.d.  6. Atenolol 50 b.i.d.  7. Synthroid.  8. Restoril 30 two q.h.s.  9. Soma 350 one q.i.d.  10.      Hydrocodone 10/500 one q.4-6h.   She tells me she has been taking her medications more than she should have,  and at one point, she told me that she had taken 10 ibuprofens at one time.  I cautioned her regarding taking her medicines inappropriately that she  could harm herself doing that and that she needs to call the clinic if she  is not getting adequate pain management.   PHYSICAL EXAMINATION:  GENERAL:  She has a blood pressure of 124/72, pulse  66, respirations 14, 98% saturated on room air. She is well-developed,  mildly obese woman, appears moderately to significantly uncomfortable. She  is somewhat tearful; however, very cooperative  and anxious.  HEART:  Regular rhythm.  LUNGS:  Clear.  ABDOMEN:  Benign. No obvious pulsatile masses were noted in the abdominal  area.  EXTREMITIES:  Reveal normal tone. She does have a rather prominent tremor in  her upper extremities. Her extremity coloration is good. There is no  cyanosis or erythema. No edema is noted. No tenderness in the extremities  was noted as well. She has good pulses distally. She is able to walk. Her  gait is antalgic and slow. She has significantly limited lumbar range of  motion just a few degrees in every direction. Reports significant tenderness  with palpation over the lumbosacral junction.   Her reflexes are 2+ in the upper extremities at biceps, triceps, brachial   radius, 3+ at the knees at the ankles. Toes are downgoing. No clonus is  noted today. She is able to perform a Romberg's test without difficulty.  Motor strength reveals 5/5 strength in the upper and lower extremities at  shoulder abductors, biceps, triceps, wrist extensors, finger flexors, and  intrinsics. She is 5/5 at the hip flexors, knee extensors, dorsi flexors,  plantar flexors, and EHL.   IMPRESSION:  Chronic low back pain, complicated by acute flare up. No  obvious focal deficits noted on exam today. She does have quite a bit of  lumbosacral tenderness, and she appears quite upset regarding her pain. The  patient also has a history of lumbar degenerative disk disease, lumbar  spondylosis. Has a history of cervical spondylosis, especially at C5-6 and  C6-7, and history of left upper extremity paresthesias.   PLAN:  1. MS Contin 15 mg one p.o. q.12h. #30.  2. Lidoderm one to three patches as directed 12 hours on and 12 hours off.  3. Soma one p.o. t.i.d. to q.i.d. p.r.n. #50.  4. Restoril 30 mg one p.o. q.h.s. p.r.n. #15.  5. The patient to follow up as directed by Dr. Lyn Hollingshead with lumbar MRI on     April 28 at 11 a.m.  6. I instructed the patient to call the clinic if she does not feel like her     pain is adequately treated. Will see her back in two weeks.      Brantley Stage, M.D.   DMK/MedQ  D:  11/19/2003 12:50:53  T:  11/19/2003 14:31:11  Job #:  [id number]   cc:   Lyn Hollingshead, M.D.   Selinda Flavin  [address]  [place]  [place] [address]  Fax: [phone]

## 2010-12-10 NOTE — Discharge Summary (Signed)
NAME:  Christine Burns, Christine Burns                  ACCOUNT NO.:  [id number]   MEDICAL RECORD NO.:  [id number]          PATIENT TYPE:  IPS   LOCATION:  0504                          FACILITY:  [facility]   PHYSICIAN:  Geoffery Lyons, M.D.      DATE OF BIRTH:  1946/01/24   DATE OF ADMISSION:  10/24/2005  DATE OF DISCHARGE:  10/31/2005                                 DISCHARGE SUMMARY   CHIEF COMPLAINT AND PRESENTING ILLNESS:  This was the second admission to  [facility]  for this 65 year old married female, former  Engineer, civil (consulting), brought to the hospital by the husband after threatening suicide.  Thought she would step [address] in front of a truck.  History of  multiple attempts by drug overdose and CO poisoning.  Said that she is  unable to get opiates from the PCP for fibromyalgia, claims the pain is  unbearable.  Says she only can sleep using __________ Restoril.   PAST PSYCHIATRIC HISTORY:  Second time [name], last time  was October 30 to November 8.  Detoxed from benzodiazepines and opiates.   ALCOHOL AND DRUG HISTORY:  Prior history of abusing opiates and  benzodiazepines.   PAST MEDICAL HISTORY:  Seizure disorder, rule out benzodiazepine withdrawal,  history of spinal stenosis, cervical spine problem.   MEDICATIONS:  Restoril favored at  night, methadone given to her by her  daughter.   PHYSICAL EXAMINATION:  Performed, failed to show any acute findings.   LABORATORY WORKUP:  CBC white blood cells 6.0, hemoglobin 11.9.  Blood  chemistries sodium 142, potassium 3.2 replaced to 4.0, glucose 99,  creatinine 1.1.  Liver enzymes SGOT 16, SGPT 17, alkaline phosphatase 73,  total bilirubin 0.6.  Drug screen negative for substances of abuse.  TSH  0.895.   MENTAL STATUS EXAM:  Revealed an alert, cooperative female, endorsed  depression, affect depression, tearful.  Speech normal in rate, tempo and  production, thought processes logical, coherent and relevant, suicidal  ideas  with plans to overdose or jump in front of a car.  Cognition well preserved.   ADMISSION DIAGNOSES:  AXIS I:  Depressive disorder not otherwise specified,  benzodiazepines and opiate abuse.  AXIS II:  No diagnosis.  AXIS III:  Hypokalemia, rule out iron insufficiency.  AXIS IV:  Moderate.  AXIS V:  Upon admission 38, highest global assessment of functioning in last  year 58.   COURSE IN HOSPITAL:  She was admitted and started in individual and group  psychotherapy.  We did a Librium detox, kept her on her Atenolol 50 mg twice  a day, Synthroid .112 daily, Flexeril 40 mg per day, Benadryl 50 every 12  hours as needed, Motrin 600 every 6 hours as needed for pain, Seroquel 25  every 4 hours as needed.  Trazodone was placed at 50.  It was later  discontinued as she claimed that she was allergic to it.  She was given some  Seroquel and some Ativan as needed for active agitation.  She endorsed that  after she left our unit months ago  she has not had any followup.  Apparently  she was going to a pain clinic.  As her urine tested positive for methadone  and Valium the pain clinic refused to see her.  Claimed that she did not use  to take them.  Last time she took a methadone that her daughter gave her  months ago.  Living with her husband, on disability.  Claims pain, yet past  history of not being compliant, abusing opiates.  Has been turned down by  some pain clinic, has been evidence of doctor shopping.  She continued to be  somatically focused, claimed that she wanted help with the pain, wanted to  be sent somewhere.  Minimizes or denies the abuse of substances, a lot of  rationalization, a sense of hopelessness, helplessness.  She was continuing  to be somatically focused, medication focused.  If she was not to get the  right medication in the office, she was going to do herself in.  When  confronted by the physician about this statement, she claims that she will  never kill  herself.  She was observed by staff interacting well with peers  but tending to be what they called gamey when she wanted medication.  She  could be intrusive but easily redirected.  On April 8 she was objectively  better.  Focused on her need to be going to a pain clinic, to be assessed  for implant in her back.  Did sleep better the night before, and on April 9  she was in full contact with reality.  There were no suicidal or homicidal  ideas, no hallucinations, no delusions.  She continued to endorse that she  would never hurt herself, support from her husband, also special counseling.   DISCHARGE DIAGNOSES:  AXIS I:  Major depression, anxiety disorder not  otherwise specified, benzodiazepines and opiate abuse.  AXIS II:  No diagnosis.  AXIS III:  Chronic pain.  AXIS IV:  Moderate.  AXIS V:  Upon discharge 50-55.   DISCHARGE MEDICATIONS:  1.  Atenolol 50 twice a day.  2.  Synthroid 112 mcg daily.  3.  Celexa 40 mg daily.  4.  Seroquel 25 twice a day and 75 at night.  5.  Trazodone 50 at night.  6.  Nasonex inhaler 2 sprays daily.  7.  Librium one every 8 hours as needed for 7 days, Librium 25 every 12      hours as needed for 7 days, then Librium 25 one in the morning for 7      days.   DISPOSITION:  Follow up [facility] in [place], Dr. Lolly Mustache  and followup appointment Dr. Reynolds Bowl for evaluation of pain and  fibromyalgia.      Geoffery Lyons, M.D.  Electronically Signed     IL/MEDQ  D:  11/09/2005  T:  11/09/2005  Job:  [id number]

## 2010-12-10 NOTE — Assessment & Plan Note (Signed)
MEDICAL RECORD NUMBER:  [id number].   Christine Burns is a 65 year old white female who is being followed in our clinic  for cervical spondylosis and cervicalgia, as well as lumbar spondylosis and  lumbago.   She was scheduled for a recheck at our pain and rehabilitative clinic today.   While she was waiting for her clinic appointment in the waiting room, she  reported to staff that she had chest pain.   She reported her pain to be a 10 on a scale of 10 substernally located.  It  started last evening on April 13, 2004, at 6 p.m. accompanied by nausea  and diaphoresis.   She was feeling lightheaded as well.   She was taken to an exam room and EMS was called prior to her clinic  appointment.  Two liters of oxygen were started per nasal cannula.  Blood  pressure was noted to be 184/106, pulse 66 and 99% saturated on room air.  EMS arrived and she was transferred to [facility] for further evaluation.       DMK/MedQ  D:  04/14/2004 13:11:17  T:  04/15/2004 10:57:24  Job #:  [id number]

## 2010-12-10 NOTE — Assessment & Plan Note (Signed)
MEDICAL RECORD NUMBER:  [id number]   Christine Burns is back in today for a recheck.  Christine Burns is somewhat upset and  tearful, apparently Christine Burns had seen neurosurgeon, Dr. Lyn Hollingshead within the last  week.  He did not feel Christine Burns was an operative candidate at this time.  He will  follow up with her in approximately 6 more weeks.  Christine Burns has been taking Soma  and reports Christine Burns does not like the way it feels, it makes her feel groggy and  disoriented.  Christine Burns is requesting pain medication.  Christine Burns apparently has been  taking her temazepam inappropriately, self-medicating for her neck and back  pain.   Christine Burns has been sleeping quite a bit in the last few days but Christine Burns also tells me  Christine Burns has been able to do walking about three times a day.   Christine Burns reports Christine Burns is not at all suicidal, however the pain is quite  frustrating and upsetting for her.   Her blood pressure is 150/94, pulse 73, respirations 20, 97% saturated on  room air.   ON EXAM TODAY:  Christine Burns appears depressed and tearful.  Christine Burns is cooperative  however.  Christine Burns is able to get out of the chair, Christine Burns has a normal gait in the  room.  Neck range of motion is limited in all planes.  Heart is in regular  rhythm.  Lungs are clear.  Abdomen is benign.  Christine Burns has a mild tremor  bilaterally.   Limitations with forward flexion and extension, lateral flexion.  Reflexes  are symmetric in upper and lower extremities.  Toes are down going, no  clonus is noted.  Motor strength is generally 5/5 in upper and lower  extremities without any obvious focal weakness.  Decreased sensation to pin  prick in C5-6 dermatomes.   IMPRESSION:  1. Cervical spondylosis.  2. Cervicalgia.  3. Cervical radiculopathy.  4. Lumbar spondylosis.   PLAN:  1. Will start patient on Duragesic 25 mcg 1 q.72h, #10 given.  2. Will refill her Restoril for a 1-week supply at a time, Christine Burns is given 30     mg 2 p.o. q.h.s. p.r.n. #14 tablets only.  3. Will have her set up for facet injections at L4-5 and L5-S1 as   recommended by Dr. Lyn Hollingshead at [facility].   I spent approximately 20 minutes with her discussing the importance of  taking medications as directed.  I told her I will be unable to treat her if  Christine Burns is non compliant with her medications again.  Will see her back in one  month and Christine Burns will be set up for facet injections.      Brantley Stage, M.D.   DMK/MedQ  D:  02/25/2004 18:02:14  T:  02/26/2004 10:02:38  Job #:  [id number]   cc:   Selinda Flavin  [address]  [place]  [place] [address]  Fax: [phone]

## 2010-12-10 NOTE — Procedures (Signed)
NAME:  Christine Burns, Christine Burns                            ACCOUNT NO.:  [id number]   MEDICAL RECORD NO.:  [id number]                   PATIENT TYPE:  REC   LOCATION:  [facility]                                  FACILITY:  [facility]   PHYSICIAN:  Erick Colace, M.D.           DATE OF BIRTH:  1946-07-24   DATE OF PROCEDURE:  DATE OF DISCHARGE:                                 OPERATIVE REPORT   DATE OF BIRTH:  February 23, 1946   MEDICAL RECORD NUMBER:  #[id number]   PROCEDURES:  Right L4 transforaminal epidural steroid injection.   INDICATIONS:  Previous improvement from right L4 transforaminal done on  October 06, 2003, lasted approximately 6 weeks.  Not back to her baseline  level of pain per her report.  Preinjection level 7/10.   MEDICATIONS:  1. Negative for new medications.  2. Continues on MS Contin 15 p.o. b.i.d.   DESCRIPTION OF PROCEDURE:  Informed consent obtained after describing risks  and benefits of the procedure with the patient.  These risks included  bleeding, bruising, infection, loss of bowel and bladder function and  paralysis.  The patient gave written consent.   The patient was placed prone on fluoroscopy table.  Betadine prepped and  sterile draped.  Skin and subcutaneous tissues anesthetized with 3 cc of 1%  lidocaine using a 25 gauge needle, 1.25 inch.  Then, a 22 gauge 3.5 inch  spinal needle was manipulated under multiple fluoroscopic views in the  subpedicular area of right L4.  Omnipaque 180 demonstrated outline of the  nerve root and some subpedicular spread.  Injection was slow to resistance.  Then a solution containing 2 cc of 1% methylparaben-free lidocaine plus 1 cc  of 40 mm/cc of Kenalog was injected.  The patient tolerated the procedure  well.  Post injection instructions were given.  She was to follow up with  Dr. Pamelia Hoit in two weeks rather than the appointment tomorrow given the  proximity of this current injection.  Hopefully, she can go longer between  injections, but may need that third injection.                                                Erick Colace, M.D.    AEK/MEDQ  D:  12/04/2003 13:56:25  T:  12/04/2003 14:21:14  Job:  [id number]

## 2010-12-10 NOTE — Group Therapy Note (Signed)
CHIEF COMPLAINT:  Back pain and neck pain.   HISTORY OF PRESENT ILLNESS:  Christine Burns is a 65 year old woman who comes to  the [facility] with a long history of low back  pain dating back to when she was 65 years old.  She reports that over the  years it has been gradually getting worse.  She describes the pain in her  low back as sharp.  It is worse with sitting and especially riding in a car.  It is made somewhat better by rest and pain medications.  She has been  followed Dr. Rosalene Billings in the past and at one point had been on 40 mg of  methadone three times a day. She is currently tells me her pain management  is fair on hydrocodone 10/500 four per day.  She is wondering if there is  anything more that can be done to alleviate her back and leg pain.   She reports that the pain and tingling in her right leg goes from the right  posterior buttock area around to the front of her thigh and then down to the  calf.  On the left side, she has tingling into the bottom of the left foot  area.   She has had a MRI of her lumbar spine on October 07, 2003 which showed disc  protrusion at L4/5 and L5-S1 and central canal narrowing especially at L4/5  due to L4/5 bulging.   Her other complaint is neck pain.  This particular pain has been going on  for approximately three years.  It is located in the middle of the lower  part of her neck and radiates to both shoulder areas.  She occasionally gets  some left arm numbness.  She describes the pain in her neck as achy and it  is worse with increased activity and improves with rest and medication.  She  denies any bowel incontinence. She does have some bladder problems which she  saw a urologist for.  Apparently, they were doing some dilating of her  urethral neck.  I do not have any notes regarding this and I am relying on  the patient's verbal history for this particular part of the history.   She has intermittent  numbness and tingling into both legs and into the left  arm.  Denies any real weakness but does report that she limited some of her  activities that she enjoys because of the pain.  For example, she stopped  canoeing, golfing, and gardening because of the neck and back pain.  She has  been treated in the past with message.  She has been through physical  therapy program and she apparently has been educated on proper body  mechanics and postural training.  She describes what appears to be spine  stabilization home program that she does three times per week.  She has had  acupuncture in the past.  She tells me that Dr. Rosalene Billings has done three  injections into her low back.  The first two did not help; however, she  reports the third injection that she received gave her pain relief for  approximately three months and she was quite pleased with that and is  requiring whether it is possible to do epidural injections through this  clinic as well.   She has seen Dr. Jaye Beagle, who is a neurosurgeon, regarding her neck  pain.  She saw him April 25, 2003.  His consultation  suggested she should  continue with conservative management for her neck and did not feel that a  surgical management would be a therapeutic option at this particular time.   The patient is essentially independent with all over her activities of daily  living including dressing, grooming, toileting, mobility.  She is able to do  the grocery shopping and does meal prep.  Basically, the only things that  she stopped doing are some of her leisure activities that she has enjoyed in  the past including golfing, gardening, and canoeing.   REVIEW OF SYMPTOMS:  Pertinent for history of depression, thyroid disease,  history of fibromyalgia, osteoarthritis, as well as hypertension.  The  patient remarked incidentally during her history that she fell two weeks ago  and broke a rib on her left side.   MEDICATIONS:  1.  Multivitamin.  2. Lasix 40 mg q.d.  3. Phenergan 25 mg q.6h. p.r.n.  4. K-Lor 1 q.d.  5. Celexa 40 mg q.d.  6. Atenolol 50 mg b.i.d.  7. Synthroid 1 q.d.  8. Restoril 30 mg 2 q.h.s.  9. Soma 350 mg q.i.d.  10.      Hydrocodone 10/500 q.4-6h.  She has two or three refills left on     her hydrocodone.  11.      She also brings in a bottle of methadone 40 mg t.i.d.  She has     stopped taking this, however, at the request of her family doctor.   PAST MEDICAL HISTORY:  1. Hypothyroidism, currently on Synthroid.  2. Hypertension.  3. Depression.  4. Familiar tremors.   PAST SURGICAL HISTORY:  Remarkable for hysterectomy in 1982 and a breast  reduction in 2001.   FAMILY HISTORY:  Remarkable for high blood pressure.   ALLERGIES:  No known drug allergies.   PHYSICAL EXAMINATION:  VITAL SIGNS:  Blood pressure 120/69, pulse 66,  respirations 16.  Saturation 96% on room air.  GENERAL:  She is a mildly obese who appeared in no distress during her  interview.  She was appropriate and cooperative and her affect was alert.  SKIN:  Essentially unremarkable.  HEENT:  Face was symmetric.  Extraocular movements were grossly intact.  NECK:  Supple without any adenopathy or tenderness anteriorly.  She had  normal tenderness posteriorly in the posterior neck area and into the  intrascapular area.  HEENT:  Regular rhythm.  LUNGS:  Clear.  ABDOMEN:  Bowel sounds positive.  NEUROLOGICAL:  Cranial nerves are grossly intact.  Sensory exam to pinprick  and light touch was intact.  Vibration and position sense were also intact.  Romberg test was normal.  Her gait was normal in the room.  She had mild  limitation with forward flexion and lateral flexion.  She was markedly  limited with extension.  Her cervical range of motion also showed moderate  limitations especially with lateral flexion and rotation both right and left.  Her reflexes were 2 at the biceps, triceps, and brachial radialis   bilaterally.  They are 3+ at the patella tendons and Achilles tendon  bilaterally.  Toes were downgoing.  There was no clonus noted.  Motor  strength in the upper extremities including shoulder abductors, biceps,  triceps, wrist extensors, finger flexors, and intrinsics were all 5/5.  The  lower extremity hip flexors, knee extensors, dorsiflexors, plantar flexors,  and EHL were all in the 5/5 range.  Straight leg raises was negative.  She  had some tenderness with palpation on  the shoulder on the right lumbar  paraspinal musculature.   IMPRESSION:  1. Cervical spondylosis.  2. Chronic neck pain.  3. Lumbar degenerative disc disease.  4. Lumbar spondylosis.  5. Chronic low back pain with bilateral lower extremity tingling.  6. Intermittent left upper extremity numbness and tingling without any motor     deficits.   PLAN:  The patient has several refills on her hydrocodone.  She takes two  10/350 mg hydrocodones in the morning and two in the evening and this is  giving her sufficient pain relief at this point.  She reports that she had  been on methadone and seemed to do better.  However, she was taking quite a  large dose and I am not comfortable prescribing her 40 mg three times a day  at this point.  Would like to get her set up to see Dr. Erick Colace  to evaluate her for lumbar epidural injection.  She did have lumbar epidural  injection when she was seeing Dr. Rosalene Billings and actually got  approximately three months of pain relief in her low back and legs after the  third epidural.  At this point, we will not need to refill any of her  medications.  She has plenty with refills.  We will see her back in one  month and reevaluate her at the next visit.  We will possibly consider  switching her to a long acting opioid.  She does report that she has an  intolerance to nonsteroidal anti-inflammatory medication.     Christine Burns, M.D.   DMK/MedQ  D:  09/24/2003  17:09:38  T:  09/24/2003 18:53:42  Job #:  [id number]   cc:   Selinda Flavin  [address]  [place]  [place] [address]  Fax: [phone]

## 2010-12-10 NOTE — Discharge Summary (Signed)
NAME:  Christine Burns, Christine Burns                  ACCOUNT NO.:  [id number]   MEDICAL RECORD NO.:  [id number]          PATIENT TYPE:  IPS   LOCATION:  0402                          FACILITY:  [facility]   PHYSICIAN:  Geoffery Lyons, M.D.      DATE OF BIRTH:  01-Jul-1946   DATE OF ADMISSION:  05/23/2005  DATE OF DISCHARGE:  06/01/2005                                 DISCHARGE SUMMARY   CHIEF COMPLAINT AND PRESENT ILLNESS:  This was the first admission to [facility] for this 65 year old white female, married,  voluntarily admitted.  Presented to the emergency room with pain from 12  bulging disks, has been to pain clinics and claimed they did not help.  The  patient's husband reported she took a hose to the car exhaust system last  October 20th and tried to kill herself.  Admitted to suicidal ideation.  No  participating in the interview.  She had a seizure upon admission and sent  to the emergency department.  Transferred back to [facility] on  October 30th.   PAST PSYCHIATRIC HISTORY:  First time at [facility].  Not clear in  terms of prior admissions.   ALCOHOL/DRUG HISTORY:  Denies active use of any substances.   MEDICAL HISTORY:  Hypokalemia, migraine headaches, fibromyalgia,  hypothyroidism, cervical disk disease.   MEDICATIONS:  Synthroid 112 mcg daily, Restoril 60 mg at night, Celexa 40 mg  per day, Promethazine 25 mg every six hours as needed, atenolol 50 mg twice  a day.   MENTAL STATUS EXAM:  Female lying in bed with jacket over her head.  Responds verbally yes or no to question.  At other times, gives no response.  Passively resistant.  Mood depression.  Affect depression.  Thought process  not spontaneous, not sharing much, initially did endorse suicidal ideation.  No evidence of delusions.  No hallucinations.  Cognition was well-preserved.   ADMISSION DIAGNOSES:  AXIS I:  Depressive disorder not otherwise specified.  Rule out benzodiazepine withdrawal.  AXIS  II:  No diagnosis.  AXIS III:  Hypokalemia, arterial hypertension, fibromyalgia, seizure  disorder.  AXIS IV:  Moderate.  AXIS V:  GAF upon admission 30; highest GAF in the last year 65.   HOSPITAL COURSE:  She was admitted.  She was started in individual and group  psychotherapy.  We detoxified with Librium.  She apparently also coming off  of pain pills.  She was maintained on the Celexa 40 mg per day, maintained  on the Phenergan 25 mg every six hours as needed, atenolol 50 mg twice a  day, Synthroid 0.112 daily.  She was given trazodone for sleep.  Eventually  trazodone was discontinued.  She was placed on Robaxin 750 mg three times a  day and at night, Seroquel 25 mg as needed for anxiety, Neurontin 300 mg  twice a day.  Eventually, Neurontin was increased to 400 mg three times a  day.  She endorsed that she has been having a very hard time with pain,  endorsed fibromyalgia, disk pain, multiple concerns, worse in the  last two  weeks, increased sense of despair, hopeless, helpless, somatically focused.  Had overdosed twice.  Had a seizure, possible status post benzodiazepine  withdrawal.  Continued to endorse suicidal ideation.  Continued to evidence  the depression and endorsing that she was very upset.  Depressed with the  pain, suicidal ruminations.  If she is not going to get any better, she  would rather be dead.  We contacted her primary physician.  Apparently, she  has been to several providers and she has been noncompliant.  Has had  numerous neurologic exams and has been to several other places.  She had  some tremors and she claimed that most of the medication made the tremors  worse.  The physician was suggesting detox and to be placed on Seroquel.  There was a family session with the husband.  They were wanting a referral  to the pain clinic.  He was supportive.  She was explained that her  physician did not want her on opiates so we worked with the Neurontin, the  muscle  relaxant.  As we switched to the Seroquel, she seemed to be sleeping  better.  The weekend of November 4th, she became agitated.  She was given a  shot injection of Geodon and she was maintained on Geodon afterwards.  Endorsed persistent pain, poor tolerance to pain, claimed she was not  suicidal.  Still wanting a referral for the pain clinic.  By November 7th,  she was better.  Pain was the main issue.  She felt clearheaded.  Did not  feel like hurting herself.  Still in pain but better able to cope with it.  On November 8th, she was in full contact with reality.  There were no  suicidal ideation, no homicidal ideation, no hallucinations, no delusions.  She was willing to pursue further work on pain management and was willing to  continue psychotherapy as well as psychotropic medications.   DISCHARGE DIAGNOSES:  AXIS I:  Major depression, recurrent.  Opiate and  benzodiazepine abuse.  Status post benzodiazepine withdrawal.  AXIS II:  No diagnosis.  AXIS III:  Hypokalemia, arterial hypertension, fibromyalgia, seizures  secondary to benzodiazepine withdrawal, chronic pain.  AXIS IV:  Moderate.  AXIS V:  GAF upon discharge 50-55.   DISCHARGE MEDICATIONS:  1.  Celexa 40 mg per day.  2.  Celebrex 400 mg twice a day.  3.  Atenolol 50 mg twice a day.  4.  Synthroid 112 mcg daily.  5.  Aspirin 325 mg daily.  6.  Neurontin 300 mg, 2 tabs three times a day.  7.  Robaxin 750 mg, 1 four times a day.  8.  Potassium 30 mEq daily.  9.  Geodon 60 mg, 2 at night with food.   FOLLOW UP:  [facility].      Geoffery Lyons, M.D.  Electronically Signed     IL/MEDQ  D:  06/10/2005  T:  06/10/2005  Job:  [id number]

## 2010-12-10 NOTE — Procedures (Signed)
Christine Burns, Christine Burns                            ACCOUNT NO.:  [id number]   MEDICAL RECORD NO.:  [id number]                   PATIENT TYPE:  REC   LOCATION:  [facility]PC                                  FACILITY:  [facility]   PHYSICIAN:  Erick Colace, M.D.           DATE OF BIRTH:  [birth year]   DATE OF PROCEDURE:  03/26/2004  DATE OF DISCHARGE:                                 OPERATIVE REPORT   MEDICAL RECORD NUMBER:  [id number].   DATE OF BIRTH:  Mar 30, 1946.   PROCEDURE:  A 65 year old female with chronic back pain partially responsive  to Duragesic patch at 12.5 mcg per hour dosage.   Rates the pain as a 8/10 in the lower back area. Request for facet  injections.   PROCEDURE:  Bilateral L5 dorsi ramus injection, bilateral L4 and L3 medial  branch blocks.   Informed consent was obtained after describing risks and benefits of the  procedure with the patient. These included bleeding bruising, infection,  loss of bowel and bladder function which could be either temporary or  permanent, and she elected to proceed.   The patient placed prone on fluoroscopy table, Betadine prep, sterile drape.  A 25-gauge 1-1/4-inch needle was used to anesthetize the skin and  subcutaneous tissue with lidocaine 1%; 1.5 cc was used to anesthetize skin  and subcu each of 6 sites. A 22-gauge 3.5-inch spinal needle was introduced  under fluoroscopic guidance to the junction between the superior articular  process and the transverse process or sacral ala, depending on level. Then,  the needle was manipulated down to bone, and Omnipaque 180 x0.3 cc was  injected under live fluoroscopy to demonstrate no evidence of intravascular  uptake or epidural spread. Then, a 2% solution of methylparaben-free  lidocaine was injected, 0.5 cc each site. The first level done was L5 on the  left, then L5 on the right, then L4 on the right, L3 on the right, L4 on the  left, and L3 on the left.   The patient tolerated the  procedure well. Post injection instructions given.   Post injection pain score initial was 5/10 compared with 8/10 pre. She is  keeping a pain diary to look at the next 24 hours.                                                Erick Colace, M.D.    AEK/MEDQ  D:  03/26/2004 12:45:24  T:  03/27/2004 10:28:02  Job:  [id number]   cc:   Jaye Beagle  [facility] [place]  [place] [address]  Fax: [phone]

## 2010-12-10 NOTE — Assessment & Plan Note (Signed)
MEDICAL RECORD NUMBER:  [id number].   Christine Burns is a 65 year old white female who is being followed in our clinic  for cervical spondylosis and cervicalgia, as well as lumbar spondylosis and  lumbago.   She was recently started on Duragesic 25 mcg/hr patch and initially did  quite well.  She reports several days ago she developed an elevated blood  pressure to 240/180 associated with headache which lasted a couple of days.  She is concerned whether or not the Duragesic could cause her headache.  This was not associated with any visual changes.  She did report some  initial generalized weakness with it.   Up until that point she had been quite a bit more functional and had been  doing some walking and exercising again.  She denies any bowel or bladder  problems which are new.  She denies any focal weakness.  Denies any trouble  walking.   Her functional status is essentially unchanged.  Her ADLs and mobility are  unchanged.  She is still driving.  She reports an overall improvement in her  headache.   She reports her sleep is good.  Denies any harm to herself or others at this  time.  Reports no changes in her past medical history.   PHYSICAL EXAMINATION:  On exam, her blood pressure is 117/59, pulse 76,  respirations 22 and 99% saturated on room air.  She is alert, oriented,  cooperative, as well as pleasant and in no apparent distress.  She is able  to get out of the chair easily.  Gait in the room is normal.  Romberg's test  is negative.  Seated reflexes are 2+ at biceps, triceps and brachial  radialis bilaterally and 2+ at the knees and ankles bilaterally.  The toes  are downgoing.  No clonus is noted.  She has good motor strength throughout  at shoulder abductors, biceps, triceps, wrist extensors, finger flexors and  intrinsics, 5/5 strength bilaterally at hip flexors, knee extensors and  flexors, dorsiflexors, plantar flexors and EHL.  Coordination:  She has  normal  finger-nose-finger.  Romberg's test is negative.  Denies any sensory  abnormalities in upper or lower extremities with light touch.  Cranial nerve  exam was also intact.   IMPRESSION:  1. Cervical spondylosis.  2. Cervicalgia.  3. Lumbar spondylosis.  4. Lumbago.   PLAN:  1. Will have the patient follow up with her family doctor, Dr. Dimas Aguas, in     [place], [place], for her elevated blood pressure and headache if it     continues.  2. Will reduce her Duragesic to 12.5 mcg/hr.  Will give her five patches.  3. Will see her back in two weeks.  4. Will give her 14 Restoril as well.  5. Will have her set up to see Dr. Wynn Banker for medial branch blocks next     week.  6. Given her recent elevated blood pressure and headache with blood pressure     240/180 per patient, would like her to follow up with Dr. Dimas Aguas in [place],     [place], regarding this.  This was discussed with her as well.  7. Will see her back then in two weeks.    Brantley Stage, M.D.   DMK/MedQ  D:  03/17/2004 16:56:06  T:  03/17/2004 21:16:04  Job #:  [id number]

## 2010-12-10 NOTE — Discharge Summary (Signed)
NAME:  Christine Burns, Christine Burns                  ACCOUNT NO.:  [id number]   MEDICAL RECORD NO.:  [id number]          PATIENT TYPE:  INP   LOCATION:  3734                         FACILITY:  [facility]   PHYSICIAN:  Theone Stanley, MD   DATE OF BIRTH:  01/20/1946   DATE OF ADMISSION:  04/14/2004  DATE OF DISCHARGE:  04/15/2004                                 DISCHARGE SUMMARY   ADMISSION DIAGNOSES:  1.  Chest pain.  2.  Migraine.  3.  Acute fibromyalgia.  4.  Hypothyroidism.  5.  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  6.  Hypertension.  7.  Benign tremor.  8.  Seasonal allergies.   DISCHARGE DIAGNOSES:  1.  Chest pain, most likely secondary to fibromyalgia.  2.  Migraine.  3.  Acute fibromyalgia.  4.  Hypothyroidism.  5.  Disk disease in the neck and back.  6.  Hypertension.  7.  Benign Tremor.  8.  Seasonal allergies.   HISTORY OF PRESENT ILLNESS:  The patient is a very pleasant 65 year old  female with a past medical history of fibromyalgia, chronic pain, migraines,  hypothyroidism, who had not been feeling well for the past week prior to  admission.  Apparently she had gone down to her pain clinic and stated that  she had some chest pain, and she was admitted to the hospital because of  this.  Of note, she has had problems with a headache for the past two weeks  and was started on a Duragesic patch.  She had gone to the pain clinic.  It  was noted that her blood pressure was 200/150.  Again, she was brought to  the emergency room.   HOSPITAL COURSE:  She was admitted to telemetry.  An electrocardiogram was  performed.  This did not show any Q-waves.  There were inversions in V1, V2  and V3.  No ST elevation or depression.  Her cardiac enzymes were negative  x2.  During her stay telemetry did not show any abnormalities.  Also of note, she was hypokalemic.  She received potassium for this.  After  receiving some Toradol she did not have any recurrent chest pain.  An  echocardiogram was also  performed, which did not show any left ventricular  dysfunction.  Her TSH was also done in the hospital, which showed to be slightly elevated,  indicating that she might need some adjustment with her Synthroid.  I  discussed this with the patient, and she stated that she would prefer Dr.  Selinda Flavin to adjust this.  It was felt that she was stable enough that she could go home the next day,  since there were no events and the cardiac enzymes were negative.  Her  potassium remained on the low side; however, she felt that she could take  this on an outpatient basis, so we gave her a prescription for potassium 60  mEq, one p.o. b.i.d., and with the knowledge that she will follow up with  Dr.  Dimas Aguas in two days, to follow up with her BMP.  The only  question is  why is she hypokalemic?  She is not on any diuretics.  May need to consider  hyperaldosteronism, especially since she does have hypertension.   DISPOSITION:  Overall, the patient was very anxious to go home, and with  close followup I felt that it was safe enough for her to be discharged in  stable condition.   DISCHARGE MEDICATIONS:  1.  Synthroid 112 mcg p.o. daily.  2.  Risperdal 60 mg, one p.o. q.h.s.  3.  Vicodin 5/500 mg, one to two p.o. q.4-6h. p.r.n.  4.  Multivitamin one p.o. daily.  5.  Tylenol 50 mg, one p.o. b.i.d.  6.  Celexa 40 mg, one p.o. daily.  7.  Potassium 60 mEq, one p.o. b.i.d. for three days.   FOLLOWUP:  1.  The patient is to follow up with Dr. Dimas Aguas in two days with a BMP.  2.  The patient is to follow up with the pain clinic in one week.       AEJ/MEDQ  D:  04/18/2004  T:  04/19/2004  Job:  [id number]

## 2010-12-10 NOTE — Procedures (Signed)
NAME:  Burns Burns Kirtland Bouchard                            ACCOUNT NO.:  [id number]   MEDICAL RECORD NO.:  [id number]                   PATIENT TYPE:  REC   LOCATION:  TPC                                  FACILITY:  [facility]   PHYSICIAN:  Erick Colace, M.D.           DATE OF BIRTH:  April 13, 1946   DATE OF PROCEDURE:  DATE OF DISCHARGE:                                 OPERATIVE REPORT   PROCEDURES:  Right L4 transforaminal epidural sternoid injection.   INDICATIONS:  Pain and tingling in the right leg going from the posterior  buttock area to the front of her thigh and down to the calf.  Her symptoms  are worse right greater than left.   MRI approximately one year ago showing disk protrusion of L4-5 and L5-S1.   Prior injections done sounds like translaminal LESI per Dr. Lew Dawes  approximately six months ago after improvement after the third.   Informed consent was obtained after describing the risks and benefits of the  procedure to the patient.  These risks included bleeding, bruising,  infection, loss of bowel and bladder function, and paralysis.  The patient  gave written consent.   REVIEW OF MEDICATIONS:  No anticoagulants or aspirin utilized.   ALLERGIES:  None to contrast dye, Kenalog, or lidocaine.   The patient was placed prone on fluoroscopy table.  Betadine prep and  sterile drape.  Skin and subcutaneous tissues anesthetized with 2.5 mL of 1%  lidocaine using 25 gauge needle, 1-1/4 inch.  Then a 22 gauge spinal needle  was manipulated under multiple fluoroscopic views to the subpedicular area  of right L4.  Omnipaque demonstrated outline of nerve root and some  subpedicular spread.  Injection was slow due to resistance.  Then a solution  containing 1.5 mL of 1% methylparaben lidocaine plus 0.75 mL of Kenalog was  injected.  The patient tolerated the procedure well.  Post injection  instructions given.  She is to follow up in three weeks.  If she had  suboptimal results, would  then consider a translaminal approach at L4-5 or  L5-S1 level.                                                Erick Colace, M.D.    AEK/MEDQ  D:  10/06/2003 14:40:49  T:  10/06/2003 15:50:49  Job:  [id number]

## 2010-12-10 NOTE — Discharge Summary (Signed)
NAME:  Christine Burns, Christine Burns                  ACCOUNT NO.:  [id number]   MEDICAL RECORD NO.:  [id number]          PATIENT TYPE:  INP   LOCATION:  3010                         FACILITY:  [facility]   PHYSICIAN:  Hilda Lias, M.D.   DATE OF BIRTH:  February 24, 1946   DATE OF ADMISSION:  07/29/2008  DATE OF DISCHARGE:  08/02/2008                               DISCHARGE SUMMARY   ADMISSION DIAGNOSIS:  Lumbar stenosis with neurogenic claudication.   FINAL DIAGNOSIS:  Lumbar stenosis with neurogenic claudication.   The patient was admitted because of back pain with radiation to the both  legs.  X-rays showed stenosis from L3 to L5-S1.  The patient has failed  with conservative treatment.  In the past, she underwent anterior  cervical diskectomy.  Because of the finding, surgery was advised.  Laboratory normal.   COURSE IN THE HOSPITAL:  The patient was taken to surgery and bilateral  L2-L5 laminectomy, decompression with posterolateral arthrodesis using  autograft was done.  After surgery, the patient complained quite a bit  of pain.  Eventually, she was able to bear weight, walk around, and by  the night, she was ambulating with minimal complaint.   CONDITION ON DISCHARGE:  Improvement.   MEDICATIONS:  Percocet, diazepam.   DIET:  Regular.   ACTIVITIES:  Not to bend, not to lift.   FOLLOWUP:  She is going to be seen by me in my office in 4 weeks.           ______________________________  Hilda Lias, M.D.     EB/MEDQ  D:  09/09/2008  T:  09/09/2008  Job:  [id number]

## 2010-12-10 NOTE — Assessment & Plan Note (Signed)
MEDICAL RECORD NUMBER:  [id number].   Christine Burns is a 65 year old woman with a history of cervical spondylosis,  cervicalgia, cervical radiculopathy, and lumbar spondylosis. She was in  approximately two weeks ago and had been taking her Restoril inappropriately  for increased pain in her back. She was placed on a 25-mcg Duragesic patch  and moves back in today for a recheck.   She reports that she has improved her function dramatically with the  Duragesic. She has been walking 45 minutes a day. She was doing some  stretching overall. Her pain scores are approximately the same; however, her  function has improved dramatically.   Her sleep has improved. Her blood pressure is 124/66, pulse 62, respirations  18, 97% saturated on room air. On exam, she is appropriate, cooperative. She  is laughing. Her affect is bright. Her gait is normal. She has 5/5 strength  in her upper and lower extremities. Reflexes are brisk throughout. No clonus  was noted. Toes are neutral.   IMPRESSION:  1. Cervical spondylosis.  2. Cervicalgia.  3. Cervical radiculopathy.  4. Lumbar spondylosis.   PLAN:  Will continue Duragesic. No further prescriptions were written today  for this medication. She was given a prescription for Restoril 30 mg 2 p.o.  q.h.s. #28. We will see her back in two weeks. We have discussed using other  things for helping her rest at night; however, she reports that many of the  tricyclics and low doses exacerbate her tremors. 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 Will also avoid Soma with  her. Would like to consider some other sleeping medication; however, this  might be the only one that works well for her. Will see her back then in 2  weeks.      Brantley Stage, M.D.   DMK/MedQ  D:  03/10/2004 12:35:15  T:  03/11/2004 12:47:34  Job #:  [id number]

## 2010-12-10 NOTE — Assessment & Plan Note (Signed)
MEDICAL RECORD NUMBER:  [id number].   Christine Burns is a 65 year old white female who is being followed in our clinic  for cervical spondylosis and cervicalgia as well as lumbar spondylosis and  lumbago.   She is back into for brief recheck. She had initially started on Duragesic  patches, 25 mcg per hour, back on February 25, 2004. She did well for a short  time, and she developed an episode of high blood pressure which was  associated with some headache. She felt it was secondary to the Duragesic.  Her Duragesic was cut back to 12.5 mg per hour. She has done well on this  for the last week and is requesting that it be increased to the 25 mcg dose  again. She has not had any other problems with the patches. She felt  initially they had been working quite well.   She reports no new problems with numbness, tingling weakness. No bowel or  bladder problems. No focal weakness. No trouble walking. Overall, she has  been fairly stable with respect to her physical condition.   She reports her pain is about a 7 on a scale of 10 on the average. Overall  sleep is good. She is functional with respect to her daily activities and  self-care.   PHYSICAL EXAMINATION:  Her blood pressure is 131/73, pulse 64, respirations  20, 96% saturation on room air.   She is alert and oriented and cooperative, bright in her affect, laughing  somewhat during our interview. She is able to get out of the chair easily.  Gait in the room is normal and stable. Romberg's test is negative seated.  Reflexes are 2+ at the knees and ankles. Toes are downgoing. No clonus is  noted. Bilateral upper extremity reflexes at biceps, triceps, brachial  radialis are all 2+ and symmetric.   Some limitations in cervical range of motion are noted. Prominent tremor in  the right upper extremity more so than left noted today.   Otherwise, extremities are without edema. Tone is otherwise normal.   Motor strength, upper and lower  extremities, is in the 5/5 range including  hip flexors, knee extensors, dorsi flexors, plantar flexors, evertors, EHL,  and knee flexors.   Sensation:  No sensory abnormalities to light touch in the lower  extremities.   IMPRESSION:  1.  Cervical spondylosis.  2.  Cervicalgia.  3.  Lumbar spondylosis.  4.  Lumbago.  5.  Facet arthropathy.   PLAN:  Will have patient follow up as planned with Dr. Wynn Banker on Friday  for medial branch blocks of her lumbar spine.   Will give her a prescription for Ultracet 1 to 2 tablets p.o. q.6h., a total  of #10. On Friday, we will refill her Duragesic. She is on 12.5 mcg per  hour. Since she has been tolerating this quite well, we will increase it  back to the  25 mcg per hour. Will give her a total of 10 on Friday. Again, we will have  her follow up with her family doctor, Dr. Dimas Aguas in [place], [place],  regarding her other medical problems. Would like her to maintain contact.      Brantley Stage, M.D.   DMK/MedQ  D:  03/24/2004 12:27:25  T:  03/24/2004 14:24:46  Job #:  [id number]

## 2011-02-01 ENCOUNTER — Ambulatory Visit (INDEPENDENT_AMBULATORY_CARE_PROVIDER_SITE_OTHER): Payer: Self-pay | Admitting: Internal Medicine

## 2011-04-29 LAB — BASIC METABOLIC PANEL
CO2: 30 mEq/L (ref 19–32)
GFR calc Af Amer: >60 mL/min (ref >60)
Glucose, Bld: 90 mg/dL (ref 70–99)
Potassium: 4.5 mEq/L (ref 3.5–5.1)
Sodium: 137 mEq/L (ref 135–145)

## 2011-04-29 LAB — CBC
Hemoglobin: 11 g/dL — ABNORMAL LOW (ref 12.0–15.0)
RDW: 14.3 % (ref 11.5–15.5)

## 2011-05-03 LAB — CBC
HCT: 34 — ABNORMAL LOW
MCHC: 34.3
MCV: 89.1
Platelets: 290
RBC: 3.82 — ABNORMAL LOW
RDW: 14.2 — ABNORMAL HIGH
WBC: 5.8

## 2011-05-03 LAB — BASIC METABOLIC PANEL
CO2: 27
Calcium: 9.6
Potassium: 4.3

## 2015-06-11 ENCOUNTER — Other Ambulatory Visit: Payer: Self-pay | Admitting: Neurosurgery

## 2015-06-11 DIAGNOSIS — M5137 Other intervertebral disc degeneration, lumbosacral region: Secondary | ICD-10-CM

## 2015-06-29 ENCOUNTER — Ambulatory Visit
Admission: RE | Admit: 2015-06-29 | Discharge: 2015-06-29 | Disposition: A | Discharge status: Home / Self Care | Payer: Medicare HMO | Source: Ambulatory Visit | Attending: Neurosurgery | Admitting: Neurosurgery

## 2015-06-29 DIAGNOSIS — M5137 Other intervertebral disc degeneration, lumbosacral region: Secondary | ICD-10-CM

## 2015-06-29 MED ORDER — IOHEXOL 300 MG/ML  SOLN
10.0000 mL | Freq: Once | INTRAMUSCULAR | Status: AC | PRN
  Administered 2015-06-29: 10 mL via INTRATHECAL

## 2015-06-29 NOTE — Progress Notes (Signed)
Pt states she has been off Cymbalta, Adderall and Trazodone for the past 2 days.  Discharge instructions explained to pt.

## 2015-06-29 NOTE — Discharge Instructions (Signed)
Myelogram Discharge Instructions  1. Go home and rest quietly for the next 24 hours.  It is important to lie flat for the next 24 hours.  Get up only to go to the restroom.  You may lie in the bed or on a couch on your back, your stomach, your left side or your right side.  You may have one pillow under your head.  You may have pillows between your knees while you are on your side or under your knees while you are on your back.  2. DO NOT drive today.  Recline the seat as far back as it will go, while still wearing your seat belt, on the way home.  3. You may get up to go to the bathroom as needed.  You may sit up for 10 minutes to eat.  You may resume your normal diet and medications unless otherwise indicated.  Drink lots of extra fluids today and tomorrow.  4. The incidence of headache, nausea, or vomiting is about 5% (one in 20 patients).  If you develop a headache, lie flat and drink plenty of fluids until the headache goes away.  Caffeinated beverages may be helpful.  If you develop severe nausea and vomiting or a headache that does not go away with flat bed rest, call [phone].  5. You may resume normal activities after your 24 hours of bed rest is over; however, do not exert yourself strongly or do any heavy lifting tomorrow. If when you get up you have a headache when standing, go back to bed and force fluids for another 24 hours.  6. Call your physician for a follow-up appointment.  The results of your myelogram will be sent directly to your physician by the following day.  7. If you have any questions or if complications develop after you arrive home, please call [phone].  Discharge instructions have been explained to the patient.  The patient, or the person responsible for the patient, fully understands these instructions.       May resume Cymbalta, Adderall and Trazodone on Dec. 5, 2016, after 9:30 am.

## 2015-09-07 ENCOUNTER — Other Ambulatory Visit: Payer: Self-pay | Admitting: Neurosurgery

## 2015-09-11 ENCOUNTER — Encounter (HOSPITAL_COMMUNITY): Payer: Self-pay

## 2015-09-11 ENCOUNTER — Encounter (HOSPITAL_COMMUNITY)
Admission: RE | Admit: 2015-09-11 | Discharge: 2015-09-11 | Disposition: A | Discharge status: Home / Self Care | Payer: Medicare HMO | Source: Ambulatory Visit | Attending: Neurosurgery | Admitting: Neurosurgery

## 2015-09-11 DIAGNOSIS — M4726 Other spondylosis with radiculopathy, lumbar region: Secondary | ICD-10-CM | POA: Insufficient documentation

## 2015-09-11 DIAGNOSIS — Z01818 Encounter for other preprocedural examination: Secondary | ICD-10-CM | POA: Diagnosis present

## 2015-09-11 DIAGNOSIS — Z01812 Encounter for preprocedural laboratory examination: Secondary | ICD-10-CM | POA: Insufficient documentation

## 2015-09-11 HISTORY — DX: Fibromyalgia: M79.7

## 2015-09-11 HISTORY — DX: Essential (primary) hypertension: I10

## 2015-09-11 HISTORY — DX: Major depressive disorder, single episode, unspecified: F32.9

## 2015-09-11 HISTORY — DX: Chronic kidney disease, unspecified: N18.9

## 2015-09-11 HISTORY — DX: Hypothyroidism, unspecified: E03.9

## 2015-09-11 HISTORY — DX: Unspecified osteoarthritis, unspecified site: M19.90

## 2015-09-11 HISTORY — DX: Anxiety disorder, unspecified: F41.9

## 2015-09-11 HISTORY — DX: Depression, unspecified: F32.A

## 2015-09-11 LAB — BASIC METABOLIC PANEL
Anion gap: 7 (ref 5–15)
BUN: 16 mg/dL (ref 6–20)
CALCIUM: 8.9 mg/dL (ref 8.9–10.3)
CO2: 28 mmol/L (ref 22–32)
CREATININE: 1.39 mg/dL — AB (ref 0.44–1.00)
Chloride: 104 mmol/L (ref 101–111)
GFR calc Af Amer: 44 mL/min — ABNORMAL LOW (ref >60)
GFR, EST NON AFRICAN AMERICAN: 38 mL/min — AB (ref >60)
GLUCOSE: 109 mg/dL — AB (ref 65–99)
Potassium: 3.9 mmol/L (ref 3.5–5.1)
Sodium: 139 mmol/L (ref 135–145)

## 2015-09-11 LAB — SURGICAL PCR SCREEN
MRSA, PCR: NEGATIVE
STAPHYLOCOCCUS AUREUS: POSITIVE — AB

## 2015-09-11 LAB — CBC
HCT: 36.8 % (ref 36.0–46.0)
HEMOGLOBIN: 11.5 g/dL — AB (ref 12.0–15.0)
MCH: 27.7 pg (ref 26.0–34.0)
MCHC: 31.3 g/dL (ref 30.0–36.0)
MCV: 88.7 fL (ref 78.0–100.0)
Platelets: 242 10*3/uL (ref 150–400)
RBC: 4.15 MIL/uL (ref 3.87–5.11)
RDW: 13.6 % (ref 11.5–15.5)
WBC: 6.7 10*3/uL (ref 4.0–10.5)

## 2015-09-11 LAB — TYPE AND SCREEN
ABO/RH(D): A POS
ANTIBODY SCREEN: NEGATIVE

## 2015-09-11 NOTE — Pre-Procedure Instructions (Signed)
Christine Burns  09/11/2015      [name] - [place], [place] - [address] Jerolyn [place] [address] Phone: [phone] Fax: [phone]    Christine procedure is scheduled on 09/15/2015.  Report to [name] at 7:30 A.M.  Call this number if you have problems the morning of surgery:   [phone]   Remember:  Do not eat food or drink liquids after midnight.  On Monday Night   Take these medicines the morning of surgery with A SIP OF WATER :  DULOXETINE, Lyrica, Propranolol,  Thyroid medicine  And Methadone    Do not wear jewelry, make-up or nail polish.  Do not wear lotions, powders, or perfumes.  You may wear deodorant.  Do not shave 48 hours prior to surgery.    Do not bring valuables to the hospital.  [name] is not responsible for any belongings or valuables.  Contacts, dentures or bridgework may not be worn into surgery.  Leave Christine suitcase in the car.  After surgery it may be brought to Christine room.  For patients admitted to the hospital, discharge time will be determined by Christine treatment team.  Patients discharged the day of surgery will not be allowed to drive home.   Name and phone number of Christine driver:  /with daughter   Special instructions:  Special Instructions: [name] - Preparing for Surgery  Before surgery, you can play an important role.  Because skin is not sterile, Christine skin needs to be as free of germs as possible.  You can reduce the number of germs on you skin by washing with CHG (chlorahexidine gluconate) soap before surgery.  CHG is an antiseptic cleaner which kills germs and bonds with the skin to continue killing germs even after washing.  Please DO NOT use if you have an allergy to CHG or antibacterial soaps.  If Christine skin becomes reddened/irritated stop using the CHG and inform Christine nurse when you arrive at Short Stay.  Do not shave (including legs and underarms) for at least 48 hours prior to  the first CHG shower.  You may shave Christine face.  Please follow these instructions carefully:   1.  Shower with CHG Soap the night before surgery and the  morning of Surgery.  2.  If you choose to wash Christine hair, wash Christine hair first as usual with Christine  normal shampoo.  3.  After you shampoo, rinse Christine hair and body thoroughly to remove the  Shampoo.  4.  Use CHG as you would any other liquid soap.  You can apply chg directly to the skin and wash gently with scrungie or a clean washcloth.  5.  Apply the CHG Soap to Christine body ONLY FROM THE NECK DOWN.    Do not use on open wounds or open sores.  Avoid contact with Christine eyes, ears, mouth and genitals (private parts).  Wash genitals (private parts)   with Christine normal soap.  6.  Wash thoroughly, paying special attention to the area where Christine surgery will be performed.  7.  Thoroughly rinse Christine body with warm water from the neck down.  8.  DO NOT shower/wash with Christine normal soap after using and rinsing off   the CHG Soap.  9.  Pat yourself dry with a clean towel.            10.  Wear clean pajamas.  11.  Place clean sheets on Christine bed the night of Christine first shower and do not sleep with pets.  Day of Surgery  Do not apply any lotions/deodorants the morning of surgery.  Please wear clean clothes to the hospital/surgery center.  Please read over the following fact sheets that you were given. Pain Booklet, Coughing and Deep Breathing, Total Joint Packet and MRSA Information

## 2015-09-11 NOTE — Progress Notes (Signed)
Mupirocin Ointment Rx called into [facility] in [place] for positive PCR of Staph. Left message on pt's voicemail informing her of results and need to pick up Rx.

## 2015-09-11 NOTE — Progress Notes (Signed)
Pt. Followed by PCP Dr. Jearld Lesch in [place], [place], states she is also seen in pain clinic at [facility] with Dr. Dawna Part, states she had an EKG w/i 3 months at [facility] pain [facility]. She denies chest concerns, she had ECHO on file here in [facility], doesn't remember why it was done. Denies having cardiac cath. In the past . Faxing request for EKG fr. Pain clinic.

## 2015-09-14 MED ORDER — CEFAZOLIN SODIUM-DEXTROSE 2-3 GM-% IV SOLR
2.0000 g | INTRAVENOUS | Status: AC
  Administered 2015-09-15 (×2): 2 g via INTRAVENOUS
  Filled 2015-09-14: qty 50

## 2015-09-15 ENCOUNTER — Inpatient Hospital Stay (HOSPITAL_COMMUNITY)
Admission: RE | Admit: 2015-09-15 | Discharge: 2015-09-18 | DRG: 458 | Disposition: A | Discharge status: Home Health | Payer: Medicare HMO | Source: Ambulatory Visit | Attending: Neurosurgery | Admitting: Neurosurgery

## 2015-09-15 ENCOUNTER — Encounter (HOSPITAL_COMMUNITY)
Admission: RE | Disposition: A | Discharge status: Home Health | Payer: Self-pay | Source: Ambulatory Visit | Attending: Neurosurgery

## 2015-09-15 ENCOUNTER — Inpatient Hospital Stay (HOSPITAL_COMMUNITY): Payer: Medicare HMO

## 2015-09-15 ENCOUNTER — Encounter (HOSPITAL_COMMUNITY): Payer: Self-pay | Admitting: Anesthesiology

## 2015-09-15 ENCOUNTER — Inpatient Hospital Stay (HOSPITAL_COMMUNITY): Payer: Medicare HMO | Admitting: Vascular Surgery

## 2015-09-15 ENCOUNTER — Inpatient Hospital Stay (HOSPITAL_COMMUNITY): Payer: Medicare HMO | Admitting: Anesthesiology

## 2015-09-15 ENCOUNTER — Other Ambulatory Visit (HOSPITAL_COMMUNITY): Payer: Medicare HMO

## 2015-09-15 DIAGNOSIS — Z90710 Acquired absence of both cervix and uterus: Secondary | ICD-10-CM

## 2015-09-15 DIAGNOSIS — Z79899 Other long term (current) drug therapy: Secondary | ICD-10-CM

## 2015-09-15 DIAGNOSIS — I129 Hypertensive chronic kidney disease with stage 1 through stage 4 chronic kidney disease, or unspecified chronic kidney disease: Secondary | ICD-10-CM | POA: Diagnosis present

## 2015-09-15 DIAGNOSIS — N189 Chronic kidney disease, unspecified: Secondary | ICD-10-CM | POA: Diagnosis present

## 2015-09-15 DIAGNOSIS — M797 Fibromyalgia: Secondary | ICD-10-CM | POA: Diagnosis present

## 2015-09-15 DIAGNOSIS — M419 Scoliosis, unspecified: Secondary | ICD-10-CM | POA: Diagnosis present

## 2015-09-15 DIAGNOSIS — Z888 Allergy status to other drugs, medicaments and biological substances status: Secondary | ICD-10-CM | POA: Diagnosis not present

## 2015-09-15 DIAGNOSIS — Z79891 Long term (current) use of opiate analgesic: Secondary | ICD-10-CM

## 2015-09-15 DIAGNOSIS — Z419 Encounter for procedure for purposes other than remedying health state, unspecified: Secondary | ICD-10-CM

## 2015-09-15 DIAGNOSIS — Z885 Allergy status to narcotic agent status: Secondary | ICD-10-CM

## 2015-09-15 DIAGNOSIS — Z886 Allergy status to analgesic agent status: Secondary | ICD-10-CM

## 2015-09-15 DIAGNOSIS — M5136 Other intervertebral disc degeneration, lumbar region: Secondary | ICD-10-CM | POA: Diagnosis present

## 2015-09-15 DIAGNOSIS — F329 Major depressive disorder, single episode, unspecified: Secondary | ICD-10-CM | POA: Diagnosis present

## 2015-09-15 DIAGNOSIS — M5116 Intervertebral disc disorders with radiculopathy, lumbar region: Secondary | ICD-10-CM | POA: Diagnosis present

## 2015-09-15 DIAGNOSIS — Z981 Arthrodesis status: Secondary | ICD-10-CM

## 2015-09-15 DIAGNOSIS — F419 Anxiety disorder, unspecified: Secondary | ICD-10-CM | POA: Diagnosis present

## 2015-09-15 DIAGNOSIS — E039 Hypothyroidism, unspecified: Secondary | ICD-10-CM | POA: Diagnosis present

## 2015-09-15 DIAGNOSIS — M51369 Other intervertebral disc degeneration, lumbar region without mention of lumbar back pain or lower extremity pain: Secondary | ICD-10-CM | POA: Diagnosis present

## 2015-09-15 DIAGNOSIS — M4316 Spondylolisthesis, lumbar region: Secondary | ICD-10-CM | POA: Diagnosis present

## 2015-09-15 DIAGNOSIS — M545 Low back pain: Secondary | ICD-10-CM | POA: Diagnosis present

## 2015-09-15 SURGERY — POSTERIOR LUMBAR FUSION 3 LEVEL
Anesthesia: General

## 2015-09-15 MED ORDER — PROPOFOL 10 MG/ML IV BOLUS
INTRAVENOUS | Status: AC
  Filled 2015-09-15: qty 20

## 2015-09-15 MED ORDER — ACETAMINOPHEN 650 MG RE SUPP: 650.0000 mg | RECTAL | Status: DC | PRN

## 2015-09-15 MED ORDER — ALBUMIN HUMAN 5 % IV SOLN
INTRAVENOUS | Status: DC | PRN
  Administered 2015-09-15 (×2): via INTRAVENOUS

## 2015-09-15 MED ORDER — DEXAMETHASONE SODIUM PHOSPHATE 10 MG/ML IJ SOLN
INTRAMUSCULAR | Status: DC | PRN
  Administered 2015-09-15: 10 mg via INTRAVENOUS

## 2015-09-15 MED ORDER — KETAMINE HCL 100 MG/ML IJ SOLN
INTRAMUSCULAR | Status: AC
  Filled 2015-09-15: qty 1

## 2015-09-15 MED ORDER — VANCOMYCIN HCL 1000 MG IV SOLR
INTRAVENOUS | Status: DC | PRN
  Administered 2015-09-15 (×2): 1000 mg via TOPICAL

## 2015-09-15 MED ORDER — VANCOMYCIN HCL 1000 MG IV SOLR
INTRAVENOUS | Status: AC
  Filled 2015-09-15: qty 2000

## 2015-09-15 MED ORDER — CEFAZOLIN SODIUM 1-5 GM-% IV SOLN
1.0000 g | Freq: Three times a day (TID) | INTRAVENOUS | Status: AC
  Administered 2015-09-16 (×2): 1 g via INTRAVENOUS
  Filled 2015-09-15 (×2): qty 50

## 2015-09-15 MED ORDER — PROPRANOLOL HCL ER 120 MG PO CP24
120.0000 mg | ORAL_CAPSULE | Freq: Every day | ORAL | Status: DC
  Administered 2015-09-16 – 2015-09-17 (×2): 120 mg via ORAL
  Filled 2015-09-15 (×3): qty 1

## 2015-09-15 MED ORDER — PREGABALIN 100 MG PO CAPS: 100.0000 mg | ORAL_CAPSULE | Freq: Two times a day (BID) | ORAL | Status: DC

## 2015-09-15 MED ORDER — PREGABALIN 75 MG PO CAPS
100.0000 mg | ORAL_CAPSULE | Freq: Two times a day (BID) | ORAL | Status: DC
  Administered 2015-09-15 – 2015-09-18 (×6): 100 mg via ORAL
  Filled 2015-09-15 (×6): qty 1

## 2015-09-15 MED ORDER — METHADONE HCL 10 MG PO TABS: 20.0000 mg | ORAL_TABLET | Freq: Two times a day (BID) | ORAL | Status: DC

## 2015-09-15 MED ORDER — ONDANSETRON HCL 4 MG/2ML IJ SOLN
INTRAMUSCULAR | Status: AC
  Filled 2015-09-15: qty 2

## 2015-09-15 MED ORDER — METHADONE HCL 10 MG PO TABS
20.0000 mg | ORAL_TABLET | Freq: Every day | ORAL | Status: DC
  Administered 2015-09-16 – 2015-09-18 (×3): 20 mg via ORAL
  Filled 2015-09-15 (×3): qty 2

## 2015-09-15 MED ORDER — CEFAZOLIN SODIUM-DEXTROSE 2-3 GM-% IV SOLR
INTRAVENOUS | Status: AC
  Filled 2015-09-15: qty 50

## 2015-09-15 MED ORDER — FENTANYL CITRATE (PF) 250 MCG/5ML IJ SOLN
INTRAMUSCULAR | Status: AC
  Filled 2015-09-15: qty 5

## 2015-09-15 MED ORDER — PROPOFOL 10 MG/ML IV BOLUS
INTRAVENOUS | Status: DC | PRN
  Administered 2015-09-15: 150 mg via INTRAVENOUS

## 2015-09-15 MED ORDER — MENTHOL 3 MG MT LOZG: 1.0000 | LOZENGE | OROMUCOSAL | Status: DC | PRN

## 2015-09-15 MED ORDER — ROCURONIUM BROMIDE 100 MG/10ML IV SOLN
INTRAVENOUS | Status: DC | PRN
  Administered 2015-09-15: 50 mg via INTRAVENOUS
  Administered 2015-09-15: 10 mg via INTRAVENOUS
  Administered 2015-09-15: 20 mg via INTRAVENOUS
  Administered 2015-09-15: 10 mg via INTRAVENOUS

## 2015-09-15 MED ORDER — LACTATED RINGERS IV SOLN
INTRAVENOUS | Status: DC
  Administered 2015-09-15 (×4): via INTRAVENOUS

## 2015-09-15 MED ORDER — METHADONE HCL 10 MG PO TABS
30.0000 mg | ORAL_TABLET | Freq: Every day | ORAL | Status: DC
  Administered 2015-09-15 – 2015-09-17 (×3): 30 mg via ORAL
  Filled 2015-09-15 (×3): qty 3

## 2015-09-15 MED ORDER — HEMOSTATIC AGENTS (NO CHARGE) OPTIME
TOPICAL | Status: DC | PRN
  Administered 2015-09-15: 1 via TOPICAL

## 2015-09-15 MED ORDER — SODIUM CHLORIDE 0.9 % IV SOLN
500.0000 mg | INTRAVENOUS | Status: DC | PRN
  Administered 2015-09-15: 10 ug/kg/min via INTRAVENOUS

## 2015-09-15 MED ORDER — CYCLOBENZAPRINE HCL 10 MG PO TABS
10.0000 mg | ORAL_TABLET | Freq: Three times a day (TID) | ORAL | Status: DC
Start: 2015-09-15 — End: 2015-09-18
  Administered 2015-09-15 – 2015-09-18 (×5): 10 mg via ORAL
  Filled 2015-09-15 (×8): qty 1

## 2015-09-15 MED ORDER — PHENOL 1.4 % MT LIQD: 1.0000 | OROMUCOSAL | Status: DC | PRN

## 2015-09-15 MED ORDER — LEVOTHYROXINE SODIUM 100 MCG PO TABS
100.0000 ug | ORAL_TABLET | Freq: Every day | ORAL | Status: DC
  Administered 2015-09-16 – 2015-09-18 (×3): 100 ug via ORAL
  Filled 2015-09-15 (×3): qty 1

## 2015-09-15 MED ORDER — SODIUM CHLORIDE 0.9 % IV SOLN: INTRAVENOUS | Status: DC

## 2015-09-15 MED ORDER — ZOLPIDEM TARTRATE 5 MG PO TABS: 5.0000 mg | ORAL_TABLET | Freq: Every evening | ORAL | Status: DC | PRN

## 2015-09-15 MED ORDER — PROPOFOL 500 MG/50ML IV EMUL
INTRAVENOUS | Status: DC | PRN
  Administered 2015-09-15: 25 ug/kg/min via INTRAVENOUS

## 2015-09-15 MED ORDER — FENTANYL CITRATE (PF) 100 MCG/2ML IJ SOLN
INTRAMUSCULAR | Status: AC
  Filled 2015-09-15: qty 2

## 2015-09-15 MED ORDER — ONDANSETRON HCL 4 MG/2ML IJ SOLN
4.0000 mg | Freq: Once | INTRAMUSCULAR | Status: AC | PRN
  Administered 2015-09-15: 4 mg via INTRAVENOUS

## 2015-09-15 MED ORDER — FENTANYL CITRATE (PF) 100 MCG/2ML IJ SOLN
INTRAMUSCULAR | Status: AC
  Administered 2015-09-15: 50 ug via INTRAVENOUS
  Filled 2015-09-15: qty 2

## 2015-09-15 MED ORDER — DIAZEPAM 5 MG PO TABS
ORAL_TABLET | ORAL | Status: AC
  Administered 2015-09-15: 5 mg via ORAL
  Filled 2015-09-15: qty 1

## 2015-09-15 MED ORDER — FENTANYL CITRATE (PF) 100 MCG/2ML IJ SOLN
INTRAMUSCULAR | Status: DC | PRN
  Administered 2015-09-15: 50 ug via INTRAVENOUS
  Administered 2015-09-15: 100 ug via INTRAVENOUS

## 2015-09-15 MED ORDER — LIDOCAINE HCL (CARDIAC) 20 MG/ML IV SOLN
INTRAVENOUS | Status: DC | PRN
  Administered 2015-09-15: 100 mg via INTRAVENOUS

## 2015-09-15 MED ORDER — SODIUM CHLORIDE 0.9% FLUSH
3.0000 mL | Freq: Two times a day (BID) | INTRAVENOUS | Status: DC
  Administered 2015-09-15 – 2015-09-18 (×4): 3 mL via INTRAVENOUS

## 2015-09-15 MED ORDER — ONDANSETRON HCL 4 MG/2ML IJ SOLN
INTRAMUSCULAR | Status: DC | PRN
  Administered 2015-09-15: 4 mg via INTRAVENOUS

## 2015-09-15 MED ORDER — SENNA 8.6 MG PO TABS
1.0000 | ORAL_TABLET | Freq: Two times a day (BID) | ORAL | Status: DC
  Administered 2015-09-15 – 2015-09-18 (×6): 8.6 mg via ORAL
  Filled 2015-09-15 (×6): qty 1

## 2015-09-15 MED ORDER — OXYCODONE-ACETAMINOPHEN 5-325 MG PO TABS
1.0000 | ORAL_TABLET | ORAL | Status: DC | PRN
  Administered 2015-09-15: 1 via ORAL
  Administered 2015-09-16 – 2015-09-17 (×5): 2 via ORAL
  Administered 2015-09-17: 1 via ORAL
  Administered 2015-09-18 (×2): 2 via ORAL
  Filled 2015-09-15 (×2): qty 1
  Filled 2015-09-15 (×8): qty 2

## 2015-09-15 MED ORDER — THROMBIN 5000 UNITS EX SOLR
CUTANEOUS | Status: DC | PRN
  Administered 2015-09-15 (×2): 5000 [IU] via TOPICAL

## 2015-09-15 MED ORDER — FENTANYL CITRATE (PF) 100 MCG/2ML IJ SOLN
25.0000 ug | INTRAMUSCULAR | Status: DC | PRN
  Administered 2015-09-15 (×3): 50 ug via INTRAVENOUS

## 2015-09-15 MED ORDER — BUPIVACAINE LIPOSOME 1.3 % IJ SUSP
20.0000 mL | Freq: Once | INTRAMUSCULAR | Status: DC
  Filled 2015-09-15: qty 20

## 2015-09-15 MED ORDER — 0.9 % SODIUM CHLORIDE (POUR BTL) OPTIME
TOPICAL | Status: DC | PRN
  Administered 2015-09-15: 1000 mL

## 2015-09-15 MED ORDER — ACETAMINOPHEN 10 MG/ML IV SOLN
INTRAVENOUS | Status: AC
  Administered 2015-09-15: 1000 mg via INTRAVENOUS
  Filled 2015-09-15: qty 100

## 2015-09-15 MED ORDER — ACETAMINOPHEN 325 MG PO TABS: 650.0000 mg | ORAL_TABLET | ORAL | Status: DC | PRN

## 2015-09-15 MED ORDER — MIDAZOLAM HCL 5 MG/5ML IJ SOLN
INTRAMUSCULAR | Status: DC | PRN
  Administered 2015-09-15: 2 mg via INTRAVENOUS

## 2015-09-15 MED ORDER — MIDAZOLAM HCL 2 MG/2ML IJ SOLN
INTRAMUSCULAR | Status: AC
  Filled 2015-09-15: qty 2

## 2015-09-15 MED ORDER — THROMBIN 5000 UNITS EX SOLR
OROMUCOSAL | Status: DC | PRN
  Administered 2015-09-15: 13:00:00 via TOPICAL

## 2015-09-15 MED ORDER — BUPIVACAINE LIPOSOME 1.3 % IJ SUSP
INTRAMUSCULAR | Status: DC | PRN
  Administered 2015-09-15: 20 mL

## 2015-09-15 MED ORDER — THROMBIN 20000 UNITS EX SOLR
CUTANEOUS | Status: DC | PRN
  Administered 2015-09-15: 13:00:00 via TOPICAL

## 2015-09-15 MED ORDER — DIAZEPAM 5 MG PO TABS
5.0000 mg | ORAL_TABLET | Freq: Four times a day (QID) | ORAL | Status: DC | PRN
  Administered 2015-09-15: 5 mg via ORAL

## 2015-09-15 MED ORDER — ARTIFICIAL TEARS OP OINT
TOPICAL_OINTMENT | OPHTHALMIC | Status: DC | PRN
  Administered 2015-09-15: 1 via OPHTHALMIC

## 2015-09-15 MED ORDER — AMPHETAMINE-DEXTROAMPHET ER 30 MG PO CP24: 30.0000 mg | ORAL_CAPSULE | Freq: Every day | ORAL | Status: DC

## 2015-09-15 MED ORDER — SUGAMMADEX SODIUM 200 MG/2ML IV SOLN
INTRAVENOUS | Status: AC
  Filled 2015-09-15: qty 2

## 2015-09-15 MED ORDER — PHENYLEPHRINE HCL 10 MG/ML IJ SOLN
INTRAMUSCULAR | Status: DC | PRN
  Administered 2015-09-15: 40 ug via INTRAVENOUS

## 2015-09-15 MED ORDER — ONDANSETRON HCL 4 MG/2ML IJ SOLN: 4.0000 mg | INTRAMUSCULAR | Status: DC | PRN

## 2015-09-15 MED ORDER — TRAZODONE HCL 50 MG PO TABS
50.0000 mg | ORAL_TABLET | Freq: Every day | ORAL | Status: DC
  Administered 2015-09-15 – 2015-09-17 (×3): 50 mg via ORAL
  Filled 2015-09-15 (×3): qty 1

## 2015-09-15 MED ORDER — SUGAMMADEX SODIUM 200 MG/2ML IV SOLN
INTRAVENOUS | Status: DC | PRN
  Administered 2015-09-15: 200 mg via INTRAVENOUS

## 2015-09-15 MED ORDER — DULOXETINE HCL 60 MG PO CPEP
60.0000 mg | ORAL_CAPSULE | Freq: Two times a day (BID) | ORAL | Status: DC
Start: 2015-09-15 — End: 2015-09-18
  Administered 2015-09-15 – 2015-09-18 (×6): 60 mg via ORAL
  Filled 2015-09-15 (×6): qty 1

## 2015-09-15 MED ORDER — ROCURONIUM BROMIDE 50 MG/5ML IV SOLN
INTRAVENOUS | Status: AC
  Filled 2015-09-15: qty 1

## 2015-09-15 MED ORDER — SODIUM CHLORIDE 0.9% FLUSH: 3.0000 mL | INTRAVENOUS | Status: DC | PRN

## 2015-09-15 MED ORDER — SODIUM CHLORIDE 0.9 % IV SOLN: 250.0000 mL | INTRAVENOUS | Status: DC

## 2015-09-15 MED ORDER — AMPHETAMINE-DEXTROAMPHET ER 10 MG PO CP24
30.0000 mg | ORAL_CAPSULE | Freq: Every day | ORAL | Status: DC
  Administered 2015-09-17 – 2015-09-18 (×2): 30 mg via ORAL
  Filled 2015-09-15 (×2): qty 3

## 2015-09-15 SURGICAL SUPPLY — 70 items
APL SKNCLS STERI-STRIP NONHPOA (GAUZE/BANDAGES/DRESSINGS) ×1
BENZOIN TINCTURE PRP APPL 2/3 (GAUZE/BANDAGES/DRESSINGS) ×3 IMPLANT
BLADE CLIPPER SURG (BLADE) IMPLANT
BUR ACORN 6.0 (BURR) ×2 IMPLANT
BUR ACORN 6.0MM (BURR) ×1
BUR MATCHSTICK NEURO 3.0 LAGG (BURR) ×3 IMPLANT
CANISTER SUCT 3000ML PPV (MISCELLANEOUS) ×3 IMPLANT
CAP LOCKING THREADED (Cap) ×24 IMPLANT
CLOSURE WOUND 1/2 X4 (GAUZE/BANDAGES/DRESSINGS) ×1
CONT SPEC 4OZ CLIKSEAL STRL BL (MISCELLANEOUS) ×3 IMPLANT
COVER BACK TABLE 60X90IN (DRAPES) ×3 IMPLANT
CROSSLINK SPINAL FUSION (Cage) ×3 IMPLANT
DRAPE C-ARM 42X72 X-RAY (DRAPES) ×6 IMPLANT
DRAPE LAPAROTOMY 100X72X124 (DRAPES) ×3 IMPLANT
DRAPE POUCH INSTRU U-SHP 10X18 (DRAPES) ×3 IMPLANT
DRSG OPSITE POSTOP 4X8 (GAUZE/BANDAGES/DRESSINGS) ×3 IMPLANT
DRSG PAD ABDOMINAL 8X10 ST (GAUZE/BANDAGES/DRESSINGS) IMPLANT
DURAPREP 26ML APPLICATOR (WOUND CARE) ×3 IMPLANT
ELECT BLADE 4.0 EZ CLEAN MEGAD (MISCELLANEOUS) ×3
ELECT REM PT RETURN 9FT ADLT (ELECTROSURGICAL) ×3
ELECTRODE BLDE 4.0 EZ CLN MEGD (MISCELLANEOUS) ×1 IMPLANT
ELECTRODE REM PT RTRN 9FT ADLT (ELECTROSURGICAL) ×1 IMPLANT
EVACUATOR 1/8 PVC DRAIN (DRAIN) IMPLANT
GAUZE SPONGE 4X4 12PLY STRL (GAUZE/BANDAGES/DRESSINGS) ×3 IMPLANT
GAUZE SPONGE 4X4 16PLY XRAY LF (GAUZE/BANDAGES/DRESSINGS) ×3 IMPLANT
GLOVE BIOGEL M 8.0 STRL (GLOVE) ×3 IMPLANT
GLOVE EXAM NITRILE LRG STRL (GLOVE) IMPLANT
GLOVE EXAM NITRILE MD LF STRL (GLOVE) IMPLANT
GLOVE EXAM NITRILE XL STR (GLOVE) IMPLANT
GLOVE EXAM NITRILE XS STR PU (GLOVE) IMPLANT
GOWN STRL REUS W/ TWL LRG LVL3 (GOWN DISPOSABLE) ×1 IMPLANT
GOWN STRL REUS W/ TWL XL LVL3 (GOWN DISPOSABLE) ×1 IMPLANT
GOWN STRL REUS W/TWL 2XL LVL3 (GOWN DISPOSABLE) IMPLANT
GOWN STRL REUS W/TWL LRG LVL3 (GOWN DISPOSABLE) ×3
GOWN STRL REUS W/TWL XL LVL3 (GOWN DISPOSABLE) ×3
HEMOSTAT POWDER SURGIFOAM 1G (HEMOSTASIS) ×3 IMPLANT
KIT BASIN OR (CUSTOM PROCEDURE TRAY) ×3 IMPLANT
KIT INFUSE LRG II (Orthopedic Implant) ×3 IMPLANT
KIT ROOM TURNOVER OR (KITS) ×3 IMPLANT
MILL MEDIUM DISP (BLADE) ×3 IMPLANT
NEEDLE HYPO 18GX1.5 BLUNT FILL (NEEDLE) IMPLANT
NEEDLE HYPO 21X1.5 SAFETY (NEEDLE) IMPLANT
NEEDLE HYPO 25X1 1.5 SAFETY (NEEDLE) IMPLANT
NS IRRIG 1000ML POUR BTL (IV SOLUTION) ×3 IMPLANT
PACK LAMINECTOMY NEURO (CUSTOM PROCEDURE TRAY) ×3 IMPLANT
PAD ARMBOARD 7.5X6 YLW CONV (MISCELLANEOUS) ×9 IMPLANT
PATTIES SURGICAL .5 X1 (DISPOSABLE) IMPLANT
PATTIES SURGICAL .5 X3 (DISPOSABLE) IMPLANT
PATTIES SURGICAL 1X1 (DISPOSABLE) ×3 IMPLANT
ROD 100MM (Rod) ×6 IMPLANT
ROD SPNL STRG 100X5.5XNS (Rod) ×2 IMPLANT
SCREW SPINE 40X5.5XPA CREO (Screw) ×8 IMPLANT
SCREW SPINE CREO 5.5X40 (Screw) ×24 IMPLANT
SEALER BIPOLAR AQUA 5.0 SHEATH (INSTRUMENTS) ×3 IMPLANT
SPACER RISE 8X22 11-17MM-15 (Spacer) ×3 IMPLANT
SPONGE LAP 4X18 X RAY DECT (DISPOSABLE) IMPLANT
SPONGE NEURO XRAY DETECT 1X3 (DISPOSABLE) IMPLANT
SPONGE SURGIFOAM ABS GEL 100 (HEMOSTASIS) ×3 IMPLANT
SPONGE SURGIFOAM ABS GEL SZ50 (HEMOSTASIS) ×3 IMPLANT
STRIP CLOSURE SKIN 1/2X4 (GAUZE/BANDAGES/DRESSINGS) ×2 IMPLANT
SUT PROLENE 6 0 BV (SUTURE) ×3 IMPLANT
SUT VIC AB 1 CT1 18XBRD ANBCTR (SUTURE) ×2 IMPLANT
SUT VIC AB 1 CT1 8-18 (SUTURE) ×6
SUT VIC AB 2-0 CP2 18 (SUTURE) ×6 IMPLANT
SUT VIC AB 3-0 SH 8-18 (SUTURE) ×6 IMPLANT
SYR 5ML LL (SYRINGE) IMPLANT
TOWEL OR 17X24 6PK STRL BLUE (TOWEL DISPOSABLE) ×3 IMPLANT
TOWEL OR 17X26 10 PK STRL BLUE (TOWEL DISPOSABLE) ×3 IMPLANT
TRAY FOLEY W/METER SILVER 14FR (SET/KITS/TRAYS/PACK) ×3 IMPLANT
WATER STERILE IRR 1000ML POUR (IV SOLUTION) ×3 IMPLANT

## 2015-09-15 NOTE — Progress Notes (Signed)
Patient arrived to 62C17. Patient is drowsy but becomes alert to light stimulus, oriented x4, honey comb dressing on back has moderate old drainage which is marked, foley in place, hemovac in place. Patient oriented to unit, staff, fall risk prevention policy, patient voiced understanding, will continue to monitor closely.

## 2015-09-15 NOTE — Progress Notes (Signed)
Patient ID: Christine Burns, female   DOB: 26-Jun-1946, 70 y.o.   MRN: [id number] Stable, moves all 4 extremities. No family around

## 2015-09-15 NOTE — Progress Notes (Signed)
Utilization review completed.  

## 2015-09-15 NOTE — Op Note (Signed)
NAMENela Bascom, Sharol                  ACCOUNT NO.:  [id number]  MEDICAL RECORD NO.:  [id number]  LOCATION:  5C17C                        FACILITY:  [facility]  PHYSICIAN:  Hilda Lias, M.D.   DATE OF BIRTH:  [birth year]  DATE OF PROCEDURE:  09/15/2015 DATE OF DISCHARGE:                              OPERATIVE REPORT   PREOPERATIVE DIAGNOSES:  Scoliosis lumbar.  Degenerative disk disease. L4-L5 spondylolisthesis, chronic radiculopathy left acute.  Status post laminectomy.  POSTOPERATIVE DIAGNOSES:  Scoliosis lumbar.  Degenerative disk disease. L4-L5 spondylolisthesis, chronic radiculopathy left acute.  Status post laminectomy.  PROCEDURE:  Bilateral 3, 4, 5 redo of the hemilaminectomy.  Facetectomy of the 2, 3, 4 facet.  Left 3-4 diskectomy and insertion of a cage. Pedicle screws from L2-L4 bilaterally.  Posterolateral arthrodesis with autograft and BMP.  Cell Saver.  C-arm.  SURGEON:  Hilda Lias, M.D.  ASSISTANT:  Dr. Bevely Palmer.  CLINICAL HISTORY:  Ms. Taliaferro is a lady who I have know for many years. She had two anterior cervical diskectomies as well as lumbar laminectomy.  She has been complaining of pain down to the left leg associated with walking.  She had conservative treatment including was seen at the [facility].  X-ray showed that she has scoliosis, thoracolumbar.  The sclerosis in the lumbar area was at the level of 2-3, 3-4, the L1-L2 normal.  She has had grade 1 spondylolisthesis between 4-5.  We talked in several occasions about surgery.  She has a second opinion by my partner, Dr. Shirlean Kelly. At the end, we agreed to proceed with decompression and fusion.  I spoke with her at length as well as with her daughter at the last appointment.  DESCRIPTION OF PROCEDURE:  The patient was taken to the OR, and after intubation, she was positioned in a prone manner.  The back was cleaned with DuraPrep.  Drapes were applied.  A midline incision following  the previous one was made.  We started at the level of the spinous process of L1 which was easily felt all the way down into the L4-L5 area.  The patient had quite a bit of scar tissue, and it was difficult to dissect, but at the end we were able to go laterally from the facet of L2, and then we proceeded down all the way down to 4-5 bilaterally.  We started in the midline where she has a re-configured lamina with decompression of the L3, L4, L5 space.  We proceeded with removal of the facets which were quite hypertrophic.  The patient had a lot of scar tissue mostly on the right side, and we had to go to dissect the nerve root of 2, 3, 4, 5 bilaterally.  At the end, we had good decompression, and we could see that after the facetectomy, the scoliosis was less.  We decided to enter the disk at the level of 3-4 on the left side.  The other one was quite narrow and quite fibrotic.  We entered the disk at the level of 3-4 left.  Gross diskectomy was done from the left side and we introduced a cage lordotic all the way down up  to 17.  The cage had BMP and autograft.  Then, using the C-arm first in AP view and then in lateral view, we made holes in the pedicles of 2-3, 4-5 bilaterally.  This procedure took [place] an hour taking consideration of the scoliosis of the patient.  At the end, prior to introducing the screw, we feel all the 4 quadrants which were surrounded by bone.  We introduced 8 screws of 5.5 x 40 followed by rods, caps, and a cross- link.  Then, we went laterally and we removed the periosteum of the lateral aspect of the 2-3, 3-4 and 4-5 and a mix of autograft and BMP were used for arthrodesis.  The patient had this more open in the midline with no evidence of CSF but a single stitch of Prolene was used to keep the area closed.  The area was irrigated, Valsalva maneuver was negative.  Vancomycin powder was left in the operative site, a drain was left, and the wound was closed with  Vicryl and staples.          ______________________________ Hilda Lias, M.D.     EB/MEDQ  D:  09/15/2015  T:  09/15/2015  Job:  [id number]

## 2015-09-15 NOTE — Transfer of Care (Signed)
Immediate Anesthesia Transfer of Care Note  Patient: Christine Burns  Procedure(s) Performed: Procedure(s) with comments: Lumbar two-three Lumbar three-four Lumbar four-five Posterior lumbar interbody fusion (N/A) - L2-3 L3-4 L4-5 Posterior lumbar interbody fusion  Patient Location: PACU  Anesthesia Type:General  Level of Consciousness: alert , sedated and patient cooperative  Airway & Oxygen Therapy: Patient Spontanous Breathing and Patient connected to face mask oxygen  Post-op Assessment: Report given to RN, Post -op Vital signs reviewed and stable and Patient moving all extremities X 4  Post vital signs: Reviewed and stable  Last Vitals:  Filed Vitals:   09/15/15 0859  BP: 190/100  Pulse: 62  Temp: 37.6 C    Complications: No apparent anesthesia complications

## 2015-09-15 NOTE — Anesthesia Postprocedure Evaluation (Signed)
Anesthesia Post Note  Patient: JOSEPHENE MARRONE  Procedure(s) Performed: Procedure(s) (LRB): Lumbar two-three Lumbar three-four Lumbar four-five Posterior lumbar interbody fusion (N/A)  Patient location during evaluation: PACU Anesthesia Type: General Level of consciousness: sedated, patient cooperative and oriented Pain management: pain level controlled Vital Signs Assessment: post-procedure vital signs reviewed and stable Respiratory status: spontaneous breathing, nonlabored ventilation, respiratory function stable and patient connected to nasal cannula oxygen Cardiovascular status: blood pressure returned to baseline and stable Postop Assessment: no signs of nausea or vomiting Anesthetic complications: no    Last Vitals:  Filed Vitals:   09/15/15 1800 09/15/15 1826  BP: 191/96 199/92  Pulse: 57 59  Temp: 36.4 C 36.6 C  Resp: 10 16    Last Pain:  Filed Vitals:   09/15/15 1827  PainSc: Asleep                 Charle Mclaurin,E. Ausar Georgiou

## 2015-09-15 NOTE — Anesthesia Procedure Notes (Signed)
Procedure Name: Intubation Date/Time: 09/15/2015 12:03 PM Performed by: Wray Kearns A Pre-anesthesia Checklist: Patient identified, Timeout performed, Emergency Drugs available, Suction available and Patient being monitored Patient Re-evaluated:Patient Re-evaluated prior to inductionOxygen Delivery Method: Circle system utilized Preoxygenation: Pre-oxygenation with 100% oxygen Intubation Type: IV induction and Cricoid Pressure applied Ventilation: Mask ventilation without difficulty Laryngoscope Size: Mac and 3 Grade View: Grade I Tube type: Oral Tube size: 7.0 mm Number of attempts: 1 Airway Equipment and Method: Stylet Placement Confirmation: ETT inserted through vocal cords under direct vision,  breath sounds checked- equal and bilateral and positive ETCO2 Secured at: 22 cm Tube secured with: Tape Dental Injury: Teeth and Oropharynx as per pre-operative assessment

## 2015-09-15 NOTE — H&P (Signed)
Christine Burns is an 70 y.o. female.   Chief Complaint: lumbar pain HPI: patient seen in my office because back pain with radiation to the right leg no better with conservative treatment. She had an epidural injection . Radiological work up shows scoliosis with ddd and stenosis  Past Medical History  Diagnosis Date  . Hypertension   . Arthritis     back & knees & fingers   . Chronic kidney disease     due to increased use of NSAIDS in the past, appt. with [facility] x1, but now is managed by PCP- N[facility]    . Hypothyroidism   . Anxiety   . Depression   . Fibromyalgia     tremors    Past Surgical History  Procedure Laterality Date  . Back surgery      cerv. fusion, low back surgery for stenosis   . Abdominal hysterectomy    . Appendectomy    . Breast reduction surgery Bilateral 1990's    No family history on file. Social History:  reports that she has never smoked. She does not have any smokeless tobacco history on file. She reports that she does not drink alcohol or use illicit drugs.  Allergies:  Allergies  Allergen Reactions  . Fentanyl Hypertension  . Ibuprofen Other (See Comments)    Kidney damage  . Diazepam Other (See Comments)      SLEEP WALKING, hallucinations  . Zolpidem Tartrate Nausea And Vomiting and Other (See Comments)    Other: Unspecified  . Meperidine Nausea Only  . Morphine Rash    Medications Prior to Admission  Medication Sig Dispense Refill  . amphetamine-dextroamphetamine (ADDERALL XR) 30 MG 24 hr capsule Take 30 mg by mouth daily before breakfast.     . clotrimazole-betamethasone (LOTRISONE) lotion APPLY TO AFFECTED AREA TWICE DAILY.    . DULoxetine (CYMBALTA) 60 MG capsule Take 120 mg by mouth daily.     Marland Kitchen levothyroxine (SYNTHROID, LEVOTHROID) 100 MCG tablet Take 100 mcg by mouth daily.    Marland Kitchen loratadine (CLARITIN) 10 MG tablet Take 10 mg by mouth daily as needed.     Marland Kitchen LYRICA 100 MG capsule Take 100 mg by mouth 2 (two) times daily.    . methadone  (DOLOPHINE) 10 MG tablet Take 20-30 mg by mouth 2 (two) times daily.  in the morning,  in the evening    . mometasone (NASONEX) 50 MCG/ACT nasal spray into each nostril once daily by Thomasene Lot MD   as needed    . propranolol ER (INDERAL LA) 120 MG 24 hr capsule Take 120 mg by mouth daily before breakfast.     . traZODone (DESYREL) 100 MG tablet Take 50 mg by mouth at bedtime.       No results found for this or any previous visit (from the past 48 hour(s)). No results found.  Review of Systems  Constitutional: Negative.   Respiratory: Negative.   Cardiovascular: Negative.   Gastrointestinal: Negative.   Genitourinary: Negative.   Musculoskeletal: Positive for back pain and neck pain.  Skin: Negative.   Neurological: Positive for sensory change and focal weakness.  Endo/Heme/Allergies: Negative.   Psychiatric/Behavioral: Negative.     Blood pressure 190/100, pulse 62, temperature 99.7 F (37.6 C), temperature source Oral, height  (1.6 m), weight 66.679 kg (147 lb), SpO2 94 %. Physical Exam hent,nl. Neck, anterioi scar. Cv, nl. Lungs, clear, abdomen, soft. Extremities, nl. Neuro slr positive at 60. Pain with lataralization of lumbar area, weakness  of df. Myelo shows spondylolisthesis at l23m ddd at 23,45. scolisos worse from l2 to l5.Marland Kitchen l5s1 wnl  Assessment/Plan We talked about no surgery, continuation of the injections, got a second opinion. Spoke with daughter in her presence.the way i see to help the most is to fuse her from l2 to l5. They agree ans are aware of risks and benefits  Karn Cassis, MD 09/15/2015, 9:03 AM

## 2015-09-15 NOTE — Anesthesia Preprocedure Evaluation (Signed)
Anesthesia Evaluation  Patient identified by MRN, date of birth, ID band Patient awake    Reviewed: Allergy & Precautions, NPO status , Patient's Chart, lab work & pertinent test results, reviewed documented beta blocker date and time   History of Anesthesia Complications Negative for: history of anesthetic complications  Airway Mallampati: II  TM Distance: >3 FB Neck ROM: Full    Dental  (+) Teeth Intact, Dental Advisory Given, Missing, Caps,    Pulmonary neg pulmonary ROS,    Pulmonary exam normal breath sounds clear to auscultation       Cardiovascular Exercise Tolerance: Good hypertension, Pt. on home beta blockers and Pt. on medications (-) angina(-) CAD and (-) Past MI Normal cardiovascular exam Rhythm:Regular Rate:Normal     Neuro/Psych PSYCHIATRIC DISORDERS Anxiety Depression Lumbar radiculopathy R>L    GI/Hepatic negative GI ROS, Neg liver ROS,   Endo/Other  Hypothyroidism   Renal/GU Renal InsufficiencyRenal disease     Musculoskeletal  (+) Arthritis , Osteoarthritis,  Fibromyalgia -, narcotic dependent  Abdominal   Peds  Hematology  (+) Blood dyscrasia, anemia ,   Anesthesia Other Findings Day of surgery medications reviewed with the patient.  Reproductive/Obstetrics                             Anesthesia Physical Anesthesia Plan  ASA: III  Anesthesia Plan: General   Post-op Pain Management:    Induction: Intravenous  Airway Management Planned: Oral ETT  Additional Equipment:   Intra-op Plan:   Post-operative Plan: Extubation in OR  Informed Consent: I have reviewed the patients History and Physical, chart, labs and discussed the procedure including the risks, benefits and alternatives for the proposed anesthesia with the patient or authorized representative who has indicated his/her understanding and acceptance.   Dental advisory given  Plan Discussed with:  CRNA  Anesthesia Plan Comments: (Risks/benefits of general anesthesia discussed with patient including risk of damage to teeth, lips, gum, and tongue, nausea/vomiting, allergic reactions to medications, and the possibility of heart attack, stroke and death.  All patient questions answered.  Patient wishes to proceed.)        Anesthesia Quick Evaluation

## 2015-09-16 LAB — CBC WITH DIFFERENTIAL/PLATELET
Basophils Absolute: 0 10*3/uL (ref 0.0–0.1)
Basophils Relative: 0 %
Eosinophils Absolute: 0 10*3/uL (ref 0.0–0.7)
Eosinophils Relative: 0 %
HCT: 23.1 % — ABNORMAL LOW (ref 36.0–46.0)
HEMOGLOBIN: 7.2 g/dL — AB (ref 12.0–15.0)
LYMPHS ABS: 1.7 10*3/uL (ref 0.7–4.0)
LYMPHS PCT: 22 %
MCH: 28 pg (ref 26.0–34.0)
MCHC: 31.2 g/dL (ref 30.0–36.0)
MCV: 89.9 fL (ref 78.0–100.0)
MONOS PCT: 13 %
Monocytes Absolute: 1 10*3/uL (ref 0.1–1.0)
NEUTROS PCT: 65 %
Neutro Abs: 5 10*3/uL (ref 1.7–7.7)
Platelets: 168 10*3/uL (ref 150–400)
RBC: 2.57 MIL/uL — AB (ref 3.87–5.11)
RDW: 14.1 % (ref 11.5–15.5)
WBC: 7.7 10*3/uL (ref 4.0–10.5)

## 2015-09-16 MED FILL — Sodium Chloride IV Soln 0.9%: INTRAVENOUS | Qty: 3000 | Status: AC

## 2015-09-16 MED FILL — Heparin Sodium (Porcine) Inj 1000 Unit/ML: INTRAMUSCULAR | Qty: 30 | Status: AC

## 2015-09-16 NOTE — Evaluation (Signed)
Occupational Therapy Evaluation Patient Details Name: Christine Burns MRN: [id number] DOB: Jul 14, 1946 Today's Date: 09/16/2015    History of Present Illness Pt is a 70 y.o. female s/p Lumbar two-three Lumbar three-four Lumbar four-five Posterior lumbar interbody fusion. PMHx: HTN, Arithrits, Chronic kidney disease, Hypothyroidism, Anxiety, Depression, Fibromyalgia.    Clinical Impression   Pt reports she was independent with ADLs and functional mobility PTA. Currently pt is overall min assist for functional mobility and min guard for ADLs. Began back, safety, and ADL education with pt. Pt planning to d/c home with 24/7 supervision from her daughter. At this time recommending HHOT for follow up, however, pt may progress to home without OT follow up. Pt would benefit from continued skilled OT to address established goals.    Follow Up Recommendations  Home health OT;Supervision - Intermittent    Equipment Recommendations  3 in 1 bedside comode    Recommendations for Other Services PT consult     Precautions / Restrictions Precautions Precautions: Back;Fall Precaution Booklet Issued: Yes (comment) Precaution Comments: Educated pt on all back precautions. Required Braces or Orthoses: Spinal Brace Spinal Brace: Lumbar corset Restrictions Weight Bearing Restrictions: No      Mobility Bed Mobility Overal bed mobility: Needs Assistance Bed Mobility: Rolling;Sidelying to Sit Rolling: Supervision Sidelying to sit: Min assist (assist at trunk to achieve upright position)       General bed mobility comments: Use of hand rails with HOB flat.  Transfers Overall transfer level: Needs assistance Equipment used: 1 person hand held assist Transfers: Sit to/from Stand Sit to Stand: Min guard         General transfer comment: Min guard for safety with sit to stand. Once in standing min hand held assist provided for stability with functional mobility.    Balance Overall balance  assessment: Needs assistance Sitting-balance support: No upper extremity supported;Feet supported Sitting balance-Leahy Scale: Good     Standing balance support: Single extremity supported Standing balance-Leahy Scale: Poor                              ADL Overall ADL's : Needs assistance/impaired Eating/Feeding: Set up;Sitting   Grooming: Min guard;Standing Grooming Details (indicate cue type and reason): Educated on cup for oral care. Upper Body Bathing: Set up;Sitting   Lower Body Bathing: Min guard;Sit to/from stand   Upper Body Dressing : Set up;Sitting Upper Body Dressing Details (indicate cue type and reason): Pt able to don back brace sitting EOB. Lower Body Dressing: Min guard;Sit to/from stand Lower Body Dressing Details (indicate cue type and reason): Pt able to cross one foot over opposite knee to pull up socks-no AE needed. Educated on compensatory strategies for LB ADLs. Toilet Transfer: Minimal assistance;Ambulation;BSC (BSC over toilet, hand held assist) Toilet Transfer Details (indicate cue type and reason): Simulated by transfer from EOB to chair Toileting- Clothing Manipulation and Hygiene: Maximal assistance;Sit to/from stand       Functional mobility during ADLs: Minimal assistance (hand held assist) General ADL Comments: No family present for OT eval. Educated pt on back precautions during functional activities, log roll technique for bed mobility.     Vision     Perception     Praxis      Pertinent Vitals/Pain Pain Assessment: 0-10 Pain Score: 3  Pain Location: back Pain Descriptors / Indicators: Sore Pain Intervention(s): Limited activity within patient's tolerance;Monitored during session;Repositioned;Premedicated before session     Hand Dominance  Extremity/Trunk Assessment Upper Extremity Assessment Upper Extremity Assessment: Generalized weakness   Lower Extremity Assessment Lower Extremity Assessment: Defer to PT  evaluation   Cervical / Trunk Assessment Cervical / Trunk Assessment: Other exceptions Cervical / Trunk Exceptions: pt is s/p lumbar sx   Communication Communication Communication: No difficulties   Cognition Arousal/Alertness: Awake/alert Behavior During Therapy: Flat affect Overall Cognitive Status: Within Functional Limits for tasks assessed                     General Comments       Exercises       Shoulder Instructions      Home Living Family/patient expects to be discharged to:: Private residence Living Arrangements: Children Available Help at Discharge: Family;Available 24 hours/day Type of Home: House Home Access: Stairs to enter [employer] of Steps: 2 Entrance Stairs-Rails: None Home Layout: One level     Bathroom Shower/Tub: Tub/shower unit;Curtain Shower/tub characteristics: Engineer, building services: Standard     Home Equipment: None          Prior Functioning/Environment Level of Independence: Independent             OT Diagnosis: Generalized weakness;Acute pain   OT Problem List: Decreased strength;Decreased activity tolerance;Impaired balance (sitting and/or standing);Decreased knowledge of use of DME or AE;Decreased knowledge of precautions;Pain   OT Treatment/Interventions: Self-care/ADL training;Energy conservation;DME and/or AE instruction;Patient/family education;Balance training    OT Goals(Current goals can be found in the care plan section) Acute Rehab OT Goals Patient Stated Goal: return to being independent OT Goal Formulation: With patient Time For Goal Achievement: 09/30/15 Potential to Achieve Goals: Good ADL Goals Pt Will Perform Grooming: with modified independence;standing Pt Will Transfer to Toilet: with modified independence;ambulating;bedside commode (over toilet) Pt Will Perform Toileting - Clothing Manipulation and hygiene: with modified independence;sit to/from stand (with or without AE) Pt Will  Perform Tub/Shower Transfer: Tub transfer;with modified independence;ambulating;3 in 1 Additional ADL Goal #1: Pt will independently maintain back precautions throughout ADL activity.  OT Frequency: Min 2X/week   Barriers to D/C:            Co-evaluation              End of Session Equipment Utilized During Treatment: Gait belt;Back brace Nurse Communication: Mobility status;Other (comment) (IV beeping, pt requesting breakfast)  Activity Tolerance: Patient tolerated treatment well Patient left: in chair;with call bell/phone within reach;with chair alarm set   Time: [phone] OT Time Calculation (min): 19 min Charges:  OT General Charges $OT Visit: 1 Procedure OT Evaluation $OT Eval Moderate Complexity: 1 Procedure G-Codes:     Gaye Alken M.S., OTR/L Pager: [phone]  09/16/2015, 8:32 AM

## 2015-09-16 NOTE — Care Management Note (Signed)
Case Management Note  Patient Details  Name: Christine Burns MRN: [id number] Date of Birth: [birth year]  Subjective/Objective:                    Action/Plan: Patient was admitted for a Lumbar two-three Lumbar three-four Lumbar four-five Posterior lumbar interbody fusion.  Lives at home with children. Will follow for discharge needs pending PT/OT evals and physician orders.  Expected Discharge Date:                  Expected Discharge Plan:     In-House Referral:     Discharge planning Services     Post Acute Care Choice:    Choice offered to:     DME Arranged:    DME Agency:     HH Arranged:    HH Agency:     Status of Service:  In process, will continue to follow  Medicare Important Message Given:    Date Medicare IM Given:    Medicare IM give by:    Date Additional Medicare IM Given:    Additional Medicare Important Message give by:     If discussed at Long Length of Stay Meetings, dates discussed:    Additional Comments:  Anda Kraft, RN 09/16/2015, 11:34 AM [phone]

## 2015-09-16 NOTE — Progress Notes (Signed)
Patient ID: Christine Burns, female   DOB: [birth year], 70 y.o.   MRN: [id number] Doing well, no pain. Still draining. Hb around 11. Wants to go home. To stay till drain is out

## 2015-09-16 NOTE — Evaluation (Signed)
Physical Therapy Evaluation Patient Details Name: Christine Burns MRN: [id number] DOB: 07-04-1946 Today's Date: 09/16/2015   History of Present Illness  Pt is a 70 y.o. female s/p Lumbar two-three Lumbar three-four Lumbar four-five Posterior lumbar interbody fusion. PMHx: HTN, Arithrits, Chronic kidney disease, Hypothyroidism, Anxiety, Depression, Fibromyalgia.   Clinical Impression  Pt presents with dependencies in mobility secondary to recent back surgery. Pt has 24 hour family assistance at home. Pt was limited today due to a headache. Educated pt on back precautions and mobility. Pt ambulated in the hall 75 ft with min guard assistance. Anticipate pt will make excellent progress with mobility. Recommend d/c home when medically cleared and follow-up HHPT. Pt will benefit from continued skilled PT until d/c home with family to maximize mobility and Independence.    Follow Up Recommendations Home health PT    Equipment Recommendations  None recommended by PT    Recommendations for Other Services       Precautions / Restrictions Precautions Precautions: Back;Fall Precaution Booklet Issued: Yes (comment) Precaution Comments: Reviewed back precautions  Required Braces or Orthoses: Spinal Brace Spinal Brace: Lumbar corset Restrictions Weight Bearing Restrictions: No      Mobility  Bed Mobility Overal bed mobility: Needs Assistance Bed Mobility: Supine to Sit Rolling: Supervision Sidelying to sit: Min assist (assist at trunk to achieve upright position) Supine to sit: Supervision     General bed mobility comments: verbal cues to maintain log roll technique  Transfers Overall transfer level: Needs assistance Equipment used: None Transfers: Sit to/from Stand Sit to Stand: Supervision         General transfer comment: Min guard for safety with sit to stand. Once in standing min hand held assist provided for stability with functional mobility.  Ambulation/Gait Ambulation/Gait  assistance: Min guard Ambulation Distance (Feet): 75 Feet Assistive device: None Gait Pattern/deviations: Step-through pattern;Decreased stride length   Gait velocity interpretation: <1.8 ft/sec, indicative of risk for recurrent falls    Stairs            Wheelchair Mobility    Modified Rankin (Stroke Patients Only)       Balance Overall balance assessment: Needs assistance Sitting-balance support: No upper extremity supported Sitting balance-Leahy Scale: Good     Standing balance support: No upper extremity supported Standing balance-Leahy Scale: Fair                               Pertinent Vitals/Pain Pain Assessment: 0-10 Pain Score: 3  Pain Location: back 3/10, headache 8/10 Pain Descriptors / Indicators: Throbbing Pain Intervention(s): Limited activity within patient's tolerance;Monitored during session    Home Living Family/patient expects to be discharged to:: Private residence Living Arrangements: Children Available Help at Discharge: Family;Available 24 hours/day Type of Home: House Home Access: Stairs to enter Entrance Stairs-Rails: None Entrance Stairs-Number of Steps: 2 Home Layout: One level Home Equipment: None      Prior Function Level of Independence: Independent               Hand Dominance        Extremity/Trunk Assessment   Upper Extremity Assessment: Defer to OT evaluation           Lower Extremity Assessment: Overall WFL for tasks assessed      Cervical / Trunk Assessment: Other exceptions  Communication   Communication: No difficulties  Cognition Arousal/Alertness: Awake/alert Behavior During Therapy: WFL for tasks assessed/performed Overall Cognitive Status: Within Functional Limits  for tasks assessed                      General Comments General comments (skin integrity, edema, etc.): Pt was limited today secondary to headache. Reinforced back precautions with mobility.     Exercises         Assessment/Plan    PT Assessment Patient needs continued PT services  PT Diagnosis Difficulty walking;Acute pain   PT Problem List Decreased activity tolerance;Decreased balance;Decreased mobility;Decreased safety awareness;Decreased knowledge of precautions  PT Treatment Interventions Gait training;Stair training;Therapeutic activities;Therapeutic exercise;Balance training;Patient/family education   PT Goals (Current goals can be found in the Care Plan section) Acute Rehab PT Goals Patient Stated Goal: to walk PT Goal Formulation: With patient Time For Goal Achievement: 09/23/15 Potential to Achieve Goals: Good    Frequency Min 5X/week   Barriers to discharge        Co-evaluation               End of Session Equipment Utilized During Treatment: Back brace Activity Tolerance: Patient limited by pain (headache) Patient left: in bed;with call bell/phone within reach Nurse Communication: Mobility status         Time: 1610-9604 PT Time Calculation (min) (ACUTE ONLY): 25 min   Charges:   PT Evaluation $PT Eval Moderate Complexity: 1 Procedure PT Treatments $Gait Training: 8-22 mins   PT G Codes:        Greggory Stallion 09/16/2015, 9:27 AM

## 2015-09-16 NOTE — Progress Notes (Signed)
MD notified of patient's hgb 11.2 to 7.2 Patient is tired, easily arousable, states she gets tired from pain medication, oatient is pale, states this is her normal color. MD aware, no new orders. Will continue to monitor patient closely.

## 2015-09-17 NOTE — Progress Notes (Signed)
Physical Therapy Treatment Patient Details Name: Christine Burns MRN: [id number] DOB: 05/07/1946 Today's Date: 09/17/2015    History of Present Illness Pt is a 70 y.o. female s/p Lumbar two-three Lumbar three-four Lumbar four-five Posterior lumbar interbody fusion. PMHx: HTN, Arithrits, Chronic kidney disease, Hypothyroidism, Anxiety, Depression, Fibromyalgia.     PT Comments    Pt was able to walk with minor help but did need the assistance.  Asked her to try the walker or even SPC but declined.  Nursing in to ask her about meds and reiterated concern not to miss something before she went home.  Pt is appropriate for walker due to back surgery support and will not use it.  Will anticipate an issue at home since she is going to have some pain but with meds may relieve her calf pain.  Follow Up Recommendations  Home health PT     Equipment Recommendations  Rolling walker with 5" wheels    Recommendations for Other Services       Precautions / Restrictions Precautions Precautions: Back;Fall Precaution Booklet Issued: Yes (comment) Precaution Comments: Pt able to recall 2/3 back precautions (no arching). Reviewed all precautions with pt. Required Braces or Orthoses: Spinal Brace Spinal Brace: Lumbar corset Restrictions Weight Bearing Restrictions: No    Mobility  Bed Mobility Overal bed mobility: Needs Assistance Bed Mobility: Rolling;Sidelying to Sit Rolling: Supervision Sidelying to sit: Supervision       General bed mobility comments: up when PT entered  Transfers Overall transfer level: Needs assistance Equipment used: 1 person hand held assist Transfers: Sit to/from [employer] Sit to Stand: Min assist Stand pivot transfers: Min assist       General transfer comment: Supervision for safety. Good hand placement and technique.  Ambulation/Gait Ambulation/Gait assistance: Min assist;Mod assist Ambulation Distance (Feet): 200 Feet Assistive device: 1  person hand held assist Gait Pattern/deviations: Step-through pattern;Antalgic;Wide base of support (calf cramp) Gait velocity: reduced Gait velocity interpretation: Below normal speed for age/gender General Gait Details: PT assisted her with HHA and pt did need support to succeed but refuses walker (Nrusing reports mm relaxer was refused earlier, recommended)   Stairs            Wheelchair Mobility    Modified Rankin (Stroke Patients Only)       Balance Overall balance assessment: Needs assistance Sitting-balance support: Feet supported Sitting balance-Leahy Scale: Good     Standing balance support: Single extremity supported Standing balance-Leahy Scale: Fair                      Cognition Arousal/Alertness: Awake/alert Behavior During Therapy: WFL for tasks assessed/performed Overall Cognitive Status: Within Functional Limits for tasks assessed       Memory: Decreased recall of precautions              Exercises      General Comments General comments (skin integrity, edema, etc.): Pt is getting up to walk and did note calf pain.  Her LLE has no hot tender spots but does generally have tightness.  Encouraged use of RW but refuses.  Nurse in to speak with her and did ask if she wanted mm relaxer she declined earlier.      Pertinent Vitals/Pain Pain Assessment: 0-10 Pain Score: 5  Pain Location: calf Pain Descriptors / Indicators: Cramping Pain Intervention(s): Monitored during session;Repositioned;Premedicated before session (encouraged her to use walker but she declined)    Home Living  Prior Function            PT Goals (current goals can now be found in the care plan section) Acute Rehab PT Goals Patient Stated Goal: none stated Progress towards PT goals: Progressing toward goals    Frequency  Min 5X/week    PT Plan Current plan remains appropriate    Co-evaluation             End of Session  Equipment Utilized During Treatment: Back brace Activity Tolerance: Patient limited by pain;Patient limited by fatigue Patient left: in chair;with call bell/phone within reach;with chair alarm set;with nursing/sitter in room     Time: 1610-9604 PT Time Calculation (min) (ACUTE ONLY): 21 min  Charges:  $Gait Training: 8-22 mins                    G Codes:      Ivar Drape 29-Sep-2015, 4:05 PM  Samul Dada, PT MS Acute Rehab Dept. Number: [facility] [id number] and [facility] [phone]

## 2015-09-17 NOTE — Progress Notes (Signed)
Patient ID: Christine Burns, female   DOB: 20-Jun-1946, 70 y.o.   MRN: [id number] Stable, no painor weakness. To dc hemovac.

## 2015-09-17 NOTE — Progress Notes (Signed)
Occupational Therapy Treatment Patient Details Name: Christine Burns MRN: [id number] DOB: 08-11-1945 Today's Date: 09/17/2015    History of present illness Pt is a 70 y.o. female s/p Lumbar two-three Lumbar three-four Lumbar four-five Posterior lumbar interbody fusion. PMHx: HTN, Arithrits, Chronic kidney disease, Hypothyroidism, Anxiety, Depression, Fibromyalgia.    OT comments  Pt making gradual progress toward OT goals. Pt currently supervision for static standing ADL activities, min guard for functional mobility. Pt able to recall 2/3 back precautions at start of session; reviewed all precautions with pt. D/c plan remains appropriate at this time. Will continue to follow acutely.   Follow Up Recommendations  Home health OT;Supervision - Intermittent    Equipment Recommendations  3 in 1 bedside comode    Recommendations for Other Services      Precautions / Restrictions Precautions Precautions: Back;Fall Precaution Comments: Pt able to recall 2/3 back precautions (no arching). Reviewed all precautions with pt. Required Braces or Orthoses: Spinal Brace Spinal Brace: Lumbar corset Restrictions Weight Bearing Restrictions: No       Mobility Bed Mobility Overal bed mobility: Needs Assistance Bed Mobility: Rolling;Sidelying to Sit Rolling: Supervision Sidelying to sit: Supervision       General bed mobility comments: Use of bed rails with HOB flat. Good technique; no VCs needed.  Transfers Overall transfer level: Needs assistance Equipment used: None Transfers: Sit to/from Stand Sit to Stand: Supervision         General transfer comment: Supervision for safety. Good hand placement and technique.    Balance Overall balance assessment: Needs assistance Sitting-balance support: No upper extremity supported;Feet supported Sitting balance-Leahy Scale: Good     Standing balance support: No upper extremity supported;During functional activity Standing balance-Leahy  Scale: Fair                     ADL Overall ADL's : Needs assistance/impaired     Grooming: Supervision/safety;Standing;Oral care;Wash/dry hands Grooming Details (indicate cue type and reason): Close supervision for safety. Pt utilized 2 cup method for oral care without VCs.                 Toilet Transfer: Min guard;Ambulation;Regular Toilet;Grab bars   Toileting- Clothing Manipulation and Hygiene: Min guard;Sit to/from stand       Functional mobility during ADLs: Min guard General ADL Comments: No family present for OT session. Reviewed back precautions in relation to ADL/IADL activities. Educated on supervision for tub transfer; pt verbalized understanding.       Vision                     Perception     Praxis      Cognition   Behavior During Therapy: WFL for tasks assessed/performed Overall Cognitive Status: Within Functional Limits for tasks assessed       Memory: Decreased recall of precautions               Extremity/Trunk Assessment               Exercises     Shoulder Instructions       General Comments      Pertinent Vitals/ Pain       Pain Assessment: 0-10 Pain Score: 7  Pain Location: back Pain Descriptors / Indicators: Aching Pain Intervention(s): Limited activity within patient's tolerance;Monitored during session;Repositioned  Home Living  Prior Functioning/Environment              Frequency Min 2X/week     Progress Toward Goals  OT Goals(current goals can now be found in the care plan section)  Progress towards OT goals: Progressing toward goals  Acute Rehab OT Goals Patient Stated Goal: none stated OT Goal Formulation: With patient  Plan Discharge plan remains appropriate    Co-evaluation                 End of Session Equipment Utilized During Treatment: Gait belt;Back brace   Activity Tolerance Patient tolerated  treatment well   Patient Left in chair;with call bell/phone within reach;with chair alarm set   Nurse Communication          Time: 1610-9604 OT Time Calculation (min): 12 min  Charges: OT General Charges $OT Visit: 1 Procedure OT Treatments $Self Care/Home Management : 8-22 mins  Gaye Alken M.S., OTR/L Pager: [phone]  09/17/2015, 12:34 PM

## 2015-09-18 MED ORDER — DIAZEPAM 5 MG PO TABS: 5.0000 mg | ORAL_TABLET | Freq: Four times a day (QID) | ORAL | Status: DC | PRN

## 2015-09-18 MED ORDER — CYCLOBENZAPRINE HCL 10 MG PO TABS
10.0000 mg | ORAL_TABLET | Freq: Three times a day (TID) | ORAL | Status: DC
Start: 2015-09-18 — End: 2015-10-24

## 2015-09-18 MED ORDER — OXYCODONE-ACETAMINOPHEN 5-325 MG PO TABS
1.0000 | ORAL_TABLET | ORAL | Status: DC | PRN
Start: 2015-09-18 — End: 2021-03-31

## 2015-09-18 NOTE — Progress Notes (Signed)
No acute events AVSS Awake and alert Full strength Ready for d/c

## 2015-09-18 NOTE — Progress Notes (Signed)
Physical Therapy Treatment Patient Details Name: Christine Burns MRN: [id number] DOB: 1945/09/22 Today's Date: 09/18/2015    History of Present Illness Pt is a 70 y.o. female s/p Lumbar two-three Lumbar three-four Lumbar four-five Posterior lumbar interbody fusion. PMHx: HTN, Arithrits, Chronic kidney disease, Hypothyroidism, Anxiety, Depression, Fibromyalgia.     PT Comments    Pt performed increased gait distance and stair training.  Pt more recepetive to RW use post education on safety and pain control at home.  Informed case manager that Pt will need RW at d/c and RN informed in addition.    Follow Up Recommendations  Home health PT     Equipment Recommendations  Rolling walker with 5" wheels (Pt agreeable to use RW at home case manager and RN informed.)    Recommendations for Other Services       Precautions / Restrictions Precautions Precautions: Back;Fall Precaution Comments: Pt able to recall 2/3 back precautions (no arching). Reviewed all precautions with pt.Pt required cues for differentiating between rolling and twisting. Required Braces or Orthoses: Spinal Brace Spinal Brace: Lumbar corset Restrictions Weight Bearing Restrictions: No    Mobility  Bed Mobility Overal bed mobility: Needs Assistance Bed Mobility: Rolling;Sidelying to Sit Rolling: Supervision Sidelying to sit: Supervision Supine to sit: Supervision     General bed mobility comments: Pt demonstrated good technique with rolling and bed mobility.  Transfers Overall transfer level: Needs assistance Equipment used: Rolling walker (2 wheeled) Transfers: Sit to/from Stand Sit to Stand: Min assist Stand pivot transfers: Min assist       General transfer comment: Supervision for safety. Good hand placement and technique.  Ambulation/Gait Ambulation/Gait assistance: Min assist;Mod assist Ambulation Distance (Feet): 250 Feet Assistive device: Rolling walker (2 wheeled) Gait Pattern/deviations:  Step-through pattern;Antalgic;Wide base of support Gait velocity: reduced   General Gait Details: Pt more receptive to use of RW for safety, balance and support.  Pt remains to demonstrate short steps with intermittent instability in B knees.     Stairs Stairs: Yes   Stair Management: No rails Number of Stairs: 4 General stair comments: Pt performed 2 stairs backwards negotiation with RW and min assist.  Pt required cues for sequencing and  RW placement for safety.  Pt performed a trial with HHA and simulated HHA with L rail.  Pt performed sequencing forward to ascend and descend 4 stairs.    Wheelchair Mobility    Modified Rankin (Stroke Patients Only)       Balance Overall balance assessment: Needs assistance   Sitting balance-Leahy Scale: Good       Standing balance-Leahy Scale: Fair                      Cognition Arousal/Alertness: Awake/alert Behavior During Therapy: WFL for tasks assessed/performed Overall Cognitive Status: Within Functional Limits for tasks assessed       Memory: Decreased recall of precautions              Exercises      General Comments        Pertinent Vitals/Pain Pain Score: 4  Pain Location: back Pain Descriptors / Indicators: Grimacing;Guarding Pain Intervention(s): Monitored during session;Repositioned;Premedicated before session    Home Living                      Prior Function            PT Goals (current goals can now be found in the care plan section) Acute Rehab  PT Goals Patient Stated Goal: none stated Potential to Achieve Goals: Good Progress towards PT goals: Progressing toward goals    Frequency       PT Plan Current plan remains appropriate    Co-evaluation             End of Session Equipment Utilized During Treatment: Back brace;Gait belt Activity Tolerance: Patient tolerated treatment well Patient left: in chair;with chair alarm set;with call bell/phone within reach      Time: 1204-1223 PT Time Calculation (min) (ACUTE ONLY): 19 min  Charges:  $Gait Training: 8-22 mins                    G Codes:      Florestine Avers 10/02/2015, 12:58 PM  Joycelyn Rua, PTA pager [phone]

## 2015-09-18 NOTE — Care Management Note (Addendum)
Case Management Note  Patient Details  Name: Christine Burns MRN: [id number] Date of Birth: May 15, 1946  Subjective/Objective:                    Action/Plan: Patient discharging home today with home health services. CM met with the patient and provided her a list of [place] agencies in the [place] area. She selected [facility]. Tiffany with [facility] notified and accepted the referral. Patient also order rolling walker and 3 in 1. Patient states she does not need the 3 in 1 but Jermaine with [facility] notified about the walker and will deliver the equipment to the room.  Will updated the bedside RN.   Expected Discharge Date:                  Expected Discharge Plan:  [place]  In-House Referral:     Discharge planning Services  CM Consult  Post Acute Care Choice:  Home Health, Durable Medical Equipment Choice offered to:  Patient  DME Arranged:   (patient did not want the equipment ordered) DME Agency:     HH Arranged:  OT, PT [place] Agency:  [place]  Status of Service:  Completed, signed off  Medicare Important Message Given:    Date Medicare IM Given:    Medicare IM give by:    Date Additional Medicare IM Given:    Additional Medicare Important Message give by:     If discussed at [place] of Stay Meetings, dates discussed:    Additional Comments:  Pollie Friar, RN 09/18/2015, 10:39 AM

## 2015-09-18 NOTE — Discharge Summary (Signed)
Date of Admission: 09/15/15  Date of Discharge: 09/18/15  PREOPERATIVE DIAGNOSES: Scoliosis lumbar. Degenerative disk disease. L4-L5 spondylolisthesis, chronic radiculopathy left acute. Status post laminectomy.  POSTOPERATIVE DIAGNOSES: Scoliosis lumbar. Degenerative disk disease. L4-L5 spondylolisthesis, chronic radiculopathy left acute. Status post laminectomy.  PROCEDURE: Bilateral 3, 4, 5 redo of the hemilaminectomy. Facetectomy of the 2, 3, 4 facet. Left 3-4 diskectomy and insertion of a cage. Pedicle screws from L2-L4 bilaterally. Posterolateral arthrodesis with autograft and BMP. Cell Saver. C-arm.  Attending: [facility] Course:  The patient was admitted for the above listed operation.  She had an uncomplicated post operative course.  She is discharged home at this time in stable medical and neurological condition  Discharged Medications: Resume prior meds  Follow up: [place] in 2 weeks

## 2015-09-18 NOTE — Care Management Important Message (Signed)
Important Message  Patient Details  Name: Christine Burns MRN: [id number] Date of Birth: [birth year]   Medicare Important Message Given:  Yes    [name] P [name] 09/18/2015, 12:56 PM

## 2015-09-18 NOTE — Progress Notes (Signed)
D/C orders received, pt for D/C home today.  IV and telemetry D/C.  Rx and D/C instructions given with verbalized understanding.  Family at bedside to assist with D/C.  Staff brought pt downstairs via wheelchair.  

## 2015-09-18 NOTE — Clinical Documentation Improvement (Addendum)
Neuro Surgery  (Query responses must be documented in the current medical record, not on the CDI BPA form in [facility].)  Possible Clinical Conditions:  - Acute Blood Loss Anemia  - Other condition  - Unable to clinically determine  Clinical Information/Indicators: CBC trend, preop and postop: Preop - 11.5/36.8 Postop - 7.2/23.1 Anesthesia Record Information: LR 3000 ml Cell Saver 260 ml intraoperatively Albumin 500 ml EBL 900 ml Urine output 400 ml   Please exercise your independent, professional judgment when responding. A specific answer is not anticipated or expected.   Thank You, Jerral Ralph  RN BSN CCDS [phone] Health Information Management [place]

## 2015-10-17 ENCOUNTER — Encounter (HOSPITAL_COMMUNITY): Payer: Self-pay

## 2015-10-17 ENCOUNTER — Inpatient Hospital Stay (HOSPITAL_COMMUNITY)
Admission: EM | Admit: 2015-10-17 | Discharge: 2015-10-24 | DRG: 885 | Disposition: A | Discharge status: Skilled Nursing Facility | Payer: Medicare HMO | Attending: Neurosurgery | Admitting: Neurosurgery

## 2015-10-17 DIAGNOSIS — R278 Other lack of coordination: Secondary | ICD-10-CM | POA: Diagnosis present

## 2015-10-17 DIAGNOSIS — R279 Unspecified lack of coordination: Secondary | ICD-10-CM | POA: Diagnosis not present

## 2015-10-17 DIAGNOSIS — M545 Low back pain, unspecified: Secondary | ICD-10-CM

## 2015-10-17 DIAGNOSIS — M549 Dorsalgia, unspecified: Secondary | ICD-10-CM | POA: Diagnosis present

## 2015-10-17 DIAGNOSIS — Z79891 Long term (current) use of opiate analgesic: Secondary | ICD-10-CM | POA: Diagnosis not present

## 2015-10-17 DIAGNOSIS — F3289 Other specified depressive episodes: Secondary | ICD-10-CM | POA: Diagnosis present

## 2015-10-17 DIAGNOSIS — Z9119 Patient's noncompliance with other medical treatment and regimen: Secondary | ICD-10-CM

## 2015-10-17 DIAGNOSIS — M4726 Other spondylosis with radiculopathy, lumbar region: Secondary | ICD-10-CM | POA: Diagnosis present

## 2015-10-17 DIAGNOSIS — M19042 Primary osteoarthritis, left hand: Secondary | ICD-10-CM | POA: Diagnosis present

## 2015-10-17 DIAGNOSIS — F419 Anxiety disorder, unspecified: Secondary | ICD-10-CM | POA: Diagnosis present

## 2015-10-17 DIAGNOSIS — E785 Hyperlipidemia, unspecified: Secondary | ICD-10-CM | POA: Diagnosis present

## 2015-10-17 DIAGNOSIS — R531 Weakness: Secondary | ICD-10-CM

## 2015-10-17 DIAGNOSIS — M797 Fibromyalgia: Secondary | ICD-10-CM | POA: Diagnosis present

## 2015-10-17 DIAGNOSIS — M5416 Radiculopathy, lumbar region: Secondary | ICD-10-CM | POA: Diagnosis present

## 2015-10-17 DIAGNOSIS — I129 Hypertensive chronic kidney disease with stage 1 through stage 4 chronic kidney disease, or unspecified chronic kidney disease: Secondary | ICD-10-CM | POA: Diagnosis present

## 2015-10-17 DIAGNOSIS — Z888 Allergy status to other drugs, medicaments and biological substances status: Secondary | ICD-10-CM

## 2015-10-17 DIAGNOSIS — M19041 Primary osteoarthritis, right hand: Secondary | ICD-10-CM | POA: Diagnosis present

## 2015-10-17 DIAGNOSIS — R339 Retention of urine, unspecified: Secondary | ICD-10-CM | POA: Diagnosis present

## 2015-10-17 DIAGNOSIS — E039 Hypothyroidism, unspecified: Secondary | ICD-10-CM | POA: Diagnosis present

## 2015-10-17 DIAGNOSIS — M170 Bilateral primary osteoarthritis of knee: Secondary | ICD-10-CM | POA: Diagnosis present

## 2015-10-17 DIAGNOSIS — G92 Toxic encephalopathy: Secondary | ICD-10-CM | POA: Diagnosis present

## 2015-10-17 DIAGNOSIS — Z885 Allergy status to narcotic agent status: Secondary | ICD-10-CM | POA: Diagnosis not present

## 2015-10-17 DIAGNOSIS — T50901A Poisoning by unspecified drugs, medicaments and biological substances, accidental (unintentional), initial encounter: Secondary | ICD-10-CM | POA: Diagnosis present

## 2015-10-17 DIAGNOSIS — N189 Chronic kidney disease, unspecified: Secondary | ICD-10-CM | POA: Diagnosis present

## 2015-10-17 DIAGNOSIS — F329 Major depressive disorder, single episode, unspecified: Secondary | ICD-10-CM | POA: Diagnosis not present

## 2015-10-17 DIAGNOSIS — R471 Dysarthria and anarthria: Secondary | ICD-10-CM

## 2015-10-17 DIAGNOSIS — G250 Essential tremor: Secondary | ICD-10-CM | POA: Diagnosis present

## 2015-10-17 DIAGNOSIS — R5383 Other fatigue: Secondary | ICD-10-CM

## 2015-10-17 DIAGNOSIS — F0631 Mood disorder due to known physiological condition with depressive features: Secondary | ICD-10-CM | POA: Diagnosis present

## 2015-10-17 DIAGNOSIS — R52 Pain, unspecified: Secondary | ICD-10-CM

## 2015-10-17 DIAGNOSIS — Z981 Arthrodesis status: Secondary | ICD-10-CM

## 2015-10-17 DIAGNOSIS — G9340 Encephalopathy, unspecified: Secondary | ICD-10-CM | POA: Diagnosis not present

## 2015-10-17 DIAGNOSIS — Z886 Allergy status to analgesic agent status: Secondary | ICD-10-CM

## 2015-10-17 LAB — CBC WITH DIFFERENTIAL/PLATELET
BASOS PCT: 0 %
Basophils Absolute: 0 10*3/uL (ref 0.0–0.1)
EOS ABS: 0.1 10*3/uL (ref 0.0–0.7)
EOS PCT: 1 %
HCT: 23.8 % — ABNORMAL LOW (ref 36.0–46.0)
HEMOGLOBIN: 7.6 g/dL — AB (ref 12.0–15.0)
LYMPHS ABS: 1.2 10*3/uL (ref 0.7–4.0)
Lymphocytes Relative: 14 %
MCH: 27.2 pg (ref 26.0–34.0)
MCHC: 31.9 g/dL (ref 30.0–36.0)
MCV: 85.3 fL (ref 78.0–100.0)
MONOS PCT: 9 %
Monocytes Absolute: 0.7 10*3/uL (ref 0.1–1.0)
NEUTROS ABS: 6.4 10*3/uL (ref 1.7–7.7)
NEUTROS PCT: 76 %
Platelets: 264 10*3/uL (ref 150–400)
RBC: 2.79 MIL/uL — ABNORMAL LOW (ref 3.87–5.11)
RDW: 14.3 % (ref 11.5–15.5)
WBC: 8.5 10*3/uL (ref 4.0–10.5)

## 2015-10-17 LAB — BASIC METABOLIC PANEL
ANION GAP: 7 (ref 5–15)
BUN: 24 mg/dL — ABNORMAL HIGH (ref 6–20)
CHLORIDE: 103 mmol/L (ref 101–111)
CO2: 25 mmol/L (ref 22–32)
CREATININE: 1.39 mg/dL — AB (ref 0.44–1.00)
Calcium: 8.6 mg/dL — ABNORMAL LOW (ref 8.9–10.3)
GFR calc non Af Amer: 38 mL/min — ABNORMAL LOW (ref >60)
GFR, EST AFRICAN AMERICAN: 44 mL/min — AB (ref >60)
GLUCOSE: 104 mg/dL — AB (ref 65–99)
Potassium: 4.3 mmol/L (ref 3.5–5.1)
Sodium: 135 mmol/L (ref 135–145)

## 2015-10-17 MED ORDER — PROPRANOLOL HCL ER 120 MG PO CP24
120.0000 mg | ORAL_CAPSULE | Freq: Every day | ORAL | Status: DC
  Administered 2015-10-18 – 2015-10-24 (×5): 120 mg via ORAL
  Filled 2015-10-17 (×7): qty 1

## 2015-10-17 MED ORDER — ONDANSETRON HCL 4 MG/2ML IJ SOLN: 4.0000 mg | Freq: Four times a day (QID) | INTRAMUSCULAR | Status: DC | PRN

## 2015-10-17 MED ORDER — SODIUM CHLORIDE 0.9% FLUSH
3.0000 mL | Freq: Two times a day (BID) | INTRAVENOUS | Status: DC
  Administered 2015-10-18 – 2015-10-23 (×8): 3 mL via INTRAVENOUS

## 2015-10-17 MED ORDER — OXYCODONE HCL 5 MG PO TABS
15.0000 mg | ORAL_TABLET | ORAL | Status: DC | PRN
  Administered 2015-10-18 – 2015-10-22 (×3): 15 mg via ORAL
  Filled 2015-10-17 (×3): qty 3

## 2015-10-17 MED ORDER — DULOXETINE HCL 60 MG PO CPEP
60.0000 mg | ORAL_CAPSULE | Freq: Two times a day (BID) | ORAL | Status: DC
  Administered 2015-10-18 – 2015-10-23 (×9): 60 mg via ORAL
  Filled 2015-10-17 (×10): qty 1

## 2015-10-17 MED ORDER — DIPHENHYDRAMINE HCL 50 MG/ML IJ SOLN: 12.5000 mg | Freq: Four times a day (QID) | INTRAMUSCULAR | Status: DC | PRN

## 2015-10-17 MED ORDER — ONDANSETRON HCL 4 MG/2ML IJ SOLN: 4.0000 mg | INTRAMUSCULAR | Status: DC | PRN

## 2015-10-17 MED ORDER — SENNA 8.6 MG PO TABS
1.0000 | ORAL_TABLET | Freq: Two times a day (BID) | ORAL | Status: DC
  Administered 2015-10-18 – 2015-10-24 (×9): 8.6 mg via ORAL
  Filled 2015-10-17 (×11): qty 1

## 2015-10-17 MED ORDER — ZOLPIDEM TARTRATE 5 MG PO TABS: 5.0000 mg | ORAL_TABLET | Freq: Every evening | ORAL | Status: DC | PRN

## 2015-10-17 MED ORDER — DIPHENHYDRAMINE HCL 12.5 MG/5ML PO ELIX: 12.5000 mg | ORAL_SOLUTION | Freq: Four times a day (QID) | ORAL | Status: DC | PRN

## 2015-10-17 MED ORDER — ACETAMINOPHEN 650 MG RE SUPP: 650.0000 mg | RECTAL | Status: DC | PRN

## 2015-10-17 MED ORDER — ACETAMINOPHEN 325 MG PO TABS
650.0000 mg | ORAL_TABLET | ORAL | Status: DC | PRN
  Administered 2015-10-20 – 2015-10-23 (×2): 650 mg via ORAL
  Filled 2015-10-17 (×2): qty 2

## 2015-10-17 MED ORDER — MENTHOL 3 MG MT LOZG: 1.0000 | LOZENGE | OROMUCOSAL | Status: DC | PRN

## 2015-10-17 MED ORDER — CEFAZOLIN SODIUM 1-5 GM-% IV SOLN
1.0000 g | Freq: Three times a day (TID) | INTRAVENOUS | Status: AC
  Administered 2015-10-18 (×2): 1 g via INTRAVENOUS
  Filled 2015-10-17 (×2): qty 50

## 2015-10-17 MED ORDER — SODIUM CHLORIDE 0.9% FLUSH: 9.0000 mL | INTRAVENOUS | Status: DC | PRN

## 2015-10-17 MED ORDER — POTASSIUM CHLORIDE IN NACL 20-0.9 MEQ/L-% IV SOLN
INTRAVENOUS | Status: DC
  Administered 2015-10-18 – 2015-10-19 (×4): via INTRAVENOUS
  Filled 2015-10-17 (×4): qty 1000

## 2015-10-17 MED ORDER — AMPHETAMINE-DEXTROAMPHET ER 10 MG PO CP24
30.0000 mg | ORAL_CAPSULE | Freq: Every day | ORAL | Status: DC
  Administered 2015-10-21 – 2015-10-24 (×2): 30 mg via ORAL
  Filled 2015-10-17 (×3): qty 3

## 2015-10-17 MED ORDER — SODIUM CHLORIDE 0.9 % IV SOLN: 250.0000 mL | INTRAVENOUS | Status: DC

## 2015-10-17 MED ORDER — HYDROMORPHONE 1 MG/ML IV SOLN
INTRAVENOUS | Status: DC
  Administered 2015-10-18: 0.3 mg via INTRAVENOUS
  Administered 2015-10-18: 01:00:00 via INTRAVENOUS
  Filled 2015-10-17: qty 25

## 2015-10-17 MED ORDER — PREGABALIN 75 MG PO CAPS: 100.0000 mg | ORAL_CAPSULE | Freq: Two times a day (BID) | ORAL | Status: DC

## 2015-10-17 MED ORDER — TRAZODONE HCL 50 MG PO TABS
50.0000 mg | ORAL_TABLET | Freq: Every day | ORAL | Status: DC
  Administered 2015-10-18 – 2015-10-23 (×5): 50 mg via ORAL
  Filled 2015-10-17 (×5): qty 1

## 2015-10-17 MED ORDER — PHENOL 1.4 % MT LIQD: 1.0000 | OROMUCOSAL | Status: DC | PRN

## 2015-10-17 MED ORDER — SODIUM CHLORIDE 0.9% FLUSH: 3.0000 mL | INTRAVENOUS | Status: DC | PRN

## 2015-10-17 MED ORDER — NALOXONE HCL 0.4 MG/ML IJ SOLN: 0.4000 mg | INTRAMUSCULAR | Status: DC | PRN

## 2015-10-17 MED ORDER — LEVOTHYROXINE SODIUM 100 MCG PO TABS
100.0000 ug | ORAL_TABLET | Freq: Every day | ORAL | Status: DC
Start: 2015-10-18 — End: 2015-10-24
  Administered 2015-10-18 – 2015-10-24 (×5): 100 ug via ORAL
  Filled 2015-10-17 (×5): qty 1

## 2015-10-17 MED ORDER — BACLOFEN 20 MG PO TABS
20.0000 mg | ORAL_TABLET | Freq: Three times a day (TID) | ORAL | Status: DC
  Administered 2015-10-18 – 2015-10-23 (×10): 20 mg via ORAL
  Filled 2015-10-17 (×2): qty 1
  Filled 2015-10-17: qty 2
  Filled 2015-10-17 (×2): qty 1
  Filled 2015-10-17: qty 2
  Filled 2015-10-17 (×5): qty 1
  Filled 2015-10-17 (×3): qty 2
  Filled 2015-10-17: qty 1
  Filled 2015-10-17: qty 2
  Filled 2015-10-17: qty 1
  Filled 2015-10-17 (×3): qty 2
  Filled 2015-10-17 (×5): qty 1
  Filled 2015-10-17: qty 2
  Filled 2015-10-17 (×4): qty 1
  Filled 2015-10-17 (×2): qty 2
  Filled 2015-10-17 (×3): qty 1
  Filled 2015-10-17: qty 2

## 2015-10-17 MED ORDER — LORATADINE 10 MG PO TABS: 10.0000 mg | ORAL_TABLET | Freq: Every day | ORAL | Status: DC | PRN

## 2015-10-17 NOTE — ED Notes (Signed)
Attempted report 

## 2015-10-17 NOTE — Progress Notes (Signed)
Pt arrived to room 5M05 from ED.  Pt is alert and oriented and having back pain 10/10. Pt ambulated to the bathroom with walker and assistance and was unsteady.  Pt states she fell a week ago at home and fell on a pipe that cut her right cheek.  Safety measures in place.  Will continue to monitor.  Estanislado EmmsAshley Schwarz, RN

## 2015-10-17 NOTE — H&P (Signed)
Christine MedicusRita K Burns is an 70 y.o. female.   Chief Complaint:  Lumbar spasmsHPI: patient who underwent lumbar fusion. Did well. Seen in my office for a f/u. Had home pt. Fell 1 week ago and since then she has increase of lumbar spasms  As well as legs. No better with medications. No weakness  Past Medical History  Diagnosis Date  . Hypertension   . Arthritis     back & knees & fingers   . Chronic kidney disease     due to increased use of NSAIDS in the past, appt. with [facility] x1, but now is managed by PCP- Nyland    . Hypothyroidism   . Anxiety   . Depression   . Fibromyalgia     tremors    Past Surgical History  Procedure Laterality Date  . Back surgery      cerv. fusion, low back surgery for stenosis   . Abdominal hysterectomy    . Appendectomy    . Breast reduction surgery Bilateral 1990's    No family history on file. Social History:  reports that she has never smoked. She does not have any smokeless tobacco history on file. She reports that she does not drink alcohol or use illicit drugs.  Allergies:  Allergies  Allergen Reactions  . Fentanyl Hypertension  . Ibuprofen Other (See Comments)    Kidney damage  . Diazepam Other (See Comments)      SLEEP WALKING, hallucinations  . Zolpidem Tartrate Nausea And Vomiting  . Meperidine Nausea Only  . Morphine Rash     (Not in a hospital admission)  No results found for this or any previous visit (from the past 48 hour(s)). No results found.  Review of Systems  Constitutional: Negative.   HENT: Negative.   Eyes: Negative.   Respiratory: Negative.   Cardiovascular: Negative.   Gastrointestinal: Negative.   Genitourinary: Negative.   Musculoskeletal: Positive for back pain.  Skin: Negative.   Neurological: Negative.   Endo/Heme/Allergies: Negative.   Psychiatric/Behavioral: Negative.     Blood pressure 154/93, pulse 77, temperature 100.3 F (37.9 C), resp. rate 18, height 5\' 3"  (1.6 m), weight 68.04 kg (150 lb), SpO2 95  %. Physical Exam hent, nl. Neck, anterior scar. Cv, nl. Lungs, clear. Abdo,en, nl. Extremities, nl. Lumbar wound well healed. Neuro no weakness. Sensory, nl. Pain with avtivity  Assessment/Plan Pain control and radiological w/u tomorrow   Karn CassisBOTERO,Al Gagen M, MD 10/17/2015, 9:51 PM

## 2015-10-17 NOTE — Progress Notes (Signed)
Patient ID: Christine Burns, female   DOB: 12/27/1945, 70 y.o.   MRN: [id number] To be admitted for pain control. Cbc pending.

## 2015-10-17 NOTE — ED Provider Notes (Signed)
CSN: [id number]     Arrival date & time 10/17/15  1930 History   First MD Initiated Contact with Patient 10/17/15 1948     Chief Complaint  Patient presents with  . Spasms     The history is provided by the patient.   patient presents with spasms and back pain. Had back surgery by Dr. Jeral FruitBotero around a month ago. States she's been having spasms since. States she has not followed up with him family member states they have seen him. States there were told that he would meet her in the ER. No fevers or chills. Pain is worse with movement. No numbness or weakness. She is on various pain meds for the symptoms. No recent change in the medications.  Past Medical History  Diagnosis Date  . Hypertension   . Arthritis     back & knees & fingers   . Chronic kidney disease     due to increased use of NSAIDS in the past, appt. with [facility] x1, but now is managed by PCP- Nyland    . Hypothyroidism   . Anxiety   . Depression   . Fibromyalgia     tremors   Past Surgical History  Procedure Laterality Date  . Back surgery      cerv. fusion, low back surgery for stenosis   . Abdominal hysterectomy    . Appendectomy    . Breast reduction surgery Bilateral 1990's   No family history on file. Social History  Substance Use Topics  . Smoking status: Never Smoker   . Smokeless tobacco: None  . Alcohol Use: No   OB History    No data available     Review of Systems  Constitutional: Negative for appetite change.  Respiratory: Negative for shortness of breath.   Cardiovascular: Negative for chest pain.  Musculoskeletal: Positive for back pain. Negative for neck pain.  Skin: Negative for wound.  Neurological: Negative for numbness.      Allergies  Fentanyl; Ibuprofen; Diazepam; Zolpidem tartrate; Meperidine; and Morphine  Home Medications   Prior to Admission medications   Medication Sig Start Date End Date Taking? Authorizing Provider  amphetamine-dextroamphetamine (ADDERALL XR) 30 MG 24  hr capsule Take 30 mg by mouth daily before breakfast.  12/23/13  Yes Historical Provider, MD  cyclobenzaprine (FLEXERIL) 10 MG tablet Take 1 tablet (10 mg total) by mouth 3 (three) times daily. Patient taking differently: Take 10 mg by mouth 2 (two) times daily.  09/18/15  Yes Loura HaltBenjamin Jared Ditty, MD  DULoxetine (CYMBALTA) 60 MG capsule Take 60 mg by mouth 2 (two) times daily.  09/04/15  Yes Historical Provider, MD  levothyroxine (SYNTHROID, LEVOTHROID) 100 MCG tablet Take 100 mcg by mouth daily. 09/04/15  Yes Historical Provider, MD  loratadine (CLARITIN) 10 MG tablet Take 10 mg by mouth daily as needed for allergies.    Yes Historical Provider, MD  LYRICA 100 MG capsule Take 100 mg by mouth 2 (two) times daily. 08/05/15  Yes Historical Provider, MD  methadone (DOLOPHINE) 10 MG tablet Take 20-30 mg by mouth 2 (two) times daily. 30mg  in the morning, 20mg  in the evening   Yes Historical Provider, MD  mometasone (NASONEX) 50 MCG/ACT nasal spray uses 1 spray in each nostril at bedtime 04/28/06  Yes Historical Provider, MD  Oxycodone HCl 10 MG TABS Take 10 mg by mouth 2 (two) times daily.   Yes Historical Provider, MD  propranolol ER (INDERAL LA) 120 MG 24 hr capsule Take 120 mg  by mouth daily before breakfast.  09/04/15  Yes Historical Provider, MD  traZODone (DESYREL) 100 MG tablet Take 50 mg by mouth at bedtime.  06/29/06  Yes Historical Provider, MD  diazepam (VALIUM) 5 MG tablet Take 1 tablet (5 mg total) by mouth every 6 (six) hours as needed for muscle spasms. 09/18/15   Loura Halt Ditty, MD  oxyCODONE-acetaminophen (PERCOCET/ROXICET) 5-325 MG tablet Take 1-2 tablets by mouth every 4 (four) hours as needed for moderate pain. 09/18/15   Loura Halt Ditty, MD   BP 154/93 mmHg  Pulse 77  Temp(Src) 100.3 F (37.9 C)  Resp 18  Ht  (1.6 m)  Wt 150 lb (68.04 kg)  BMI 26.58 kg/m2  SpO2 95% Physical Exam  Constitutional: She appears well-developed and well-nourished.  Cardiovascular: Normal  rate.   Pulmonary/Chest: Effort normal.  Musculoskeletal:  Patient has episodes of back spasms while examining. Neurovascular intact in bilateral feet.  Neurological: She is alert.  Skin: Skin is warm.    ED Course  Procedures (including critical care time) Labs Review Labs Reviewed - No data to display  Imaging Review No results found. I have personally reviewed and evaluated these images and lab results as part of my medical decision-making.   EKG Interpretation None      MDM   Final diagnoses:  Midline low back pain without sciatica    Patient with back pain. Nurse discussed with Dr. Jeral Fruit who recommended the patient get admitted to his service.    Benjiman Core, MD 10/17/15 2105

## 2015-10-17 NOTE — ED Notes (Signed)
Pt had back surgery about a month ago and the past few days has been having muscle spasms, called neuro doctor and he told her to come here for evaluation today.

## 2015-10-18 ENCOUNTER — Inpatient Hospital Stay (HOSPITAL_COMMUNITY): Payer: Medicare HMO

## 2015-10-18 ENCOUNTER — Encounter (HOSPITAL_COMMUNITY): Payer: Self-pay | Admitting: Radiology

## 2015-10-18 LAB — URINALYSIS, ROUTINE W REFLEX MICROSCOPIC
BILIRUBIN URINE: NEGATIVE
Bilirubin Urine: NEGATIVE
GLUCOSE, UA: NEGATIVE mg/dL
Glucose, UA: NEGATIVE mg/dL
HGB URINE DIPSTICK: NEGATIVE
Hgb urine dipstick: NEGATIVE
KETONES UR: NEGATIVE mg/dL
Ketones, ur: NEGATIVE mg/dL
LEUKOCYTES UA: NEGATIVE
LEUKOCYTES UA: NEGATIVE
NITRITE: NEGATIVE
Nitrite: NEGATIVE
PH: 5.5 (ref 5.0–8.0)
PROTEIN: NEGATIVE mg/dL
Protein, ur: 30 mg/dL — AB
Specific Gravity, Urine: 1.01 (ref 1.005–1.030)
Specific Gravity, Urine: 1.01 (ref 1.005–1.030)
pH: 5.5 (ref 5.0–8.0)

## 2015-10-18 LAB — BLOOD GAS, ARTERIAL
ACID-BASE DEFICIT: 0.9 mmol/L (ref 0.0–2.0)
BICARBONATE: 23.7 meq/L (ref 20.0–24.0)
Drawn by: 252031
FIO2: 21
O2 SAT: 92.6 %
PATIENT TEMPERATURE: 98.6
PO2 ART: 69.5 mmHg — AB (ref 80.0–100.0)
TCO2: 25 mmol/L (ref 0–100)
pCO2 arterial: 42.3 mmHg (ref 35.0–45.0)
pH, Arterial: 7.367 (ref 7.350–7.450)

## 2015-10-18 LAB — URINE MICROSCOPIC-ADD ON

## 2015-10-18 LAB — MRSA PCR SCREENING: MRSA by PCR: NEGATIVE

## 2015-10-18 MED ORDER — LORAZEPAM 2 MG/ML IJ SOLN
INTRAMUSCULAR | Status: AC
  Filled 2015-10-18: qty 1

## 2015-10-18 MED ORDER — NALOXONE HCL 0.4 MG/ML IJ SOLN
0.4000 mg | INTRAMUSCULAR | Status: DC | PRN
  Administered 2015-10-18: 0.2 mg via INTRAVENOUS

## 2015-10-18 MED ORDER — PREGABALIN 75 MG PO CAPS
100.0000 mg | ORAL_CAPSULE | Freq: Two times a day (BID) | ORAL | Status: DC
  Administered 2015-10-18 – 2015-10-23 (×8): 100 mg via ORAL
  Filled 2015-10-18 (×9): qty 1

## 2015-10-18 MED ORDER — NALOXONE HCL 0.4 MG/ML IJ SOLN
INTRAMUSCULAR | Status: AC
  Administered 2015-10-18: 0.2 mg via INTRAVENOUS
  Filled 2015-10-18: qty 1

## 2015-10-18 MED ORDER — LORAZEPAM 2 MG/ML IJ SOLN
2.0000 mg | Freq: Once | INTRAMUSCULAR | Status: AC
  Administered 2015-10-18: 2 mg via INTRAVENOUS

## 2015-10-18 NOTE — Progress Notes (Signed)
rn did bladder scan, 745 ml noted. Paged md, waiting for call back. Pt has not been put on the bedpan 3 times. Pt has not been able to void. Pt keeps mashing down on her bladder trying to make herself urinate.

## 2015-10-18 NOTE — Progress Notes (Signed)
Pt has been lethargic/ sleepy all morning, rn must do sternal rub to get pt to respond. Pt did not receive any dilaudid from PCA. Pt only had oxy last night, pt did finally wake up became impulsive trying to get up to use the bathroom. Urinated a small amount in pants. rn helped pt changed into new underwear. Pt had very jerky movements, unable to focus well.  Had large bowel movement. Denied pain, but then would complain of back pain.   md botera alerted to pts sleepiness/drowsiness.

## 2015-10-18 NOTE — Progress Notes (Signed)
Pt still screaming at nurse, saying "let me pee". Pt has been put on the bed pan twice now, but will not pee. Pt writhes around in bed and then asks to be cathed. Pt will not hold still to allow rn to get blood pressure. Keeps saying things like "you will pay for this" to nurse.

## 2015-10-18 NOTE — Progress Notes (Signed)
pts family called, rn spoke with on phone, explained pt is in restraints and what is going on with the pt. 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

## 2015-10-18 NOTE — Progress Notes (Signed)
Finally able to get foley catheter placed. Several attempts by different nurses.

## 2015-10-18 NOTE — Progress Notes (Signed)
Pt continues to writhe and twist in bed. Pt still trying to bit herself and hurt staff. 5 staff members trying to restrain pt.md botero called back. Verbal order for restraints. Verbal order for 2mg  ativan.

## 2015-10-18 NOTE — Progress Notes (Addendum)
Within 1 minute of given 0.2mg  narcan, pt started flipping out, screaming "please stop" and "you are pulling my hair out" when staff where not even touching pt. Pt is trying to bite staff and bite her own hands. Pt trying to kick staff.   Pt ripped off nasal cannula, ripped off IV tubes, broken them and was biting them. rn had disconnected tubing from IV to administer narcan earlier.

## 2015-10-18 NOTE — Progress Notes (Signed)
Pt now is getting more drowsy, respirations getting slower again. Will continue to monitor.

## 2015-10-18 NOTE — Significant Event (Signed)
Rapid Response Event Note Called to see pt for decreased LOC Overview: Time Called: 2038 Arrival Time: 2039 Event Type: Neurologic  Initial Focused Assessment: Per primary RN pt received N[facility] and became very agitated.  She was then given Ativan and has been minimally responsive since then.  On exam the pt requires a deep sternal rub to obtain a delayed grimace and fluttering of BUE & BLE.  All extremities appear equal in strength.  Pupils on exam initially 2, but after sternal rub increased to 5 & brisk.  No focal deficits noted.  VSS.  Discussed pt with Dr. Bevely Palmeritty and suggested ABG to r/o increased CO2 and possibly CT head as she did not have one after her fall. Last Hgb 7.6 and pt appears quite pale.  T=98.8 rectal RR 12  HR 64 BP 115/74 97% 2L [place]    Interventions: ABG 7.367/42/69.5/23.7 Hgb 7 (from ABG) CT Head  Event Summary: Dr. Bevely Palmeritty 2049    Alfrieda Tarry Hedgecock

## 2015-10-18 NOTE — Progress Notes (Signed)
rn attempted to place foley with staff assistance, unsuccessful, will have another rn attempt. Pt has gotten out of restraints at least twice now. Pt threatened to smack nurse, saying " I will smack you". Trying to bite nurse, actually grabbed nurses hand and pts teeth were touching rn hand through gloves.

## 2015-10-18 NOTE — Progress Notes (Signed)
rn spoke with dr Jeral Fruitbotero, verbal order for foley and urinalysis

## 2015-10-18 NOTE — Progress Notes (Signed)
md botero paged, pt R=5 per minute. Pt would not open eyes even with 1 minute sternal rub

## 2015-10-18 NOTE — Progress Notes (Signed)
PT Cancellation Note  Patient Details Name: Christine Burns MRN: [id number] DOB: 05/13/1946   Cancelled Treatment:    Reason Eval/Treat Not Completed: Medical issues which prohibited therapy.  Events of today noted.  Spoke with RN who reports patient continues to be agitated.  Will hold PT today and return tomorrow for PT evaluation as appropriate for patient.   Christine Burns, Christine Perham H 10/18/2015, 3:04 PM Durenda HurtSusan H. [name], PT, [facility] Pager [phone]

## 2015-10-18 NOTE — Progress Notes (Signed)
Pt now resting in bed with eyes closed. Will switch documentation to non violent restraints.

## 2015-10-18 NOTE — Progress Notes (Signed)
OT Cancellation Note  Patient Details Name: Christine Burns MRN: [id number] DOB: 06/09/1946   Cancelled Treatment:    Reason Eval/Treat Not Completed: Medical issues which prohibited therapy. RN present in room upon arrival. RN demonstrated pts inability to arouse to voice and sternal rubs. RN to notify MD. Will follow up for OT eval tomorrow.  Christine Burns M.S., OTR/L Pager: [phone]  10/18/2015, 1:25 PM

## 2015-10-18 NOTE — Progress Notes (Signed)
Patient ID: Christine Burns, female   DOB: 12/20/1945, 70 y.o.   MRN: [id number] Hb from 7.2 to 7.7   Sleepy, less spasms. No weakness. Plan dc pca

## 2015-10-19 NOTE — Progress Notes (Signed)
PT Cancellation Note  Patient Details Name: Gayla MedicusRita K Macomber MRN: [id number] DOB: 08/24/1945   Cancelled Treatment:    Reason Eval/Treat Not Completed: lethargy limiting ability to participate. Discussed with RN and pt has been somnolent all day. Will only briefly arouse, make confused mumblings, and immediately back asleep.   Will continue to monitor for appropriateness of PT evaluation.   Nihaal Friesen 10/19/2015, 1:42 PM  Pager [phone]

## 2015-10-19 NOTE — Progress Notes (Signed)
Patient is more awake at this time but appears confused, attempting to get out of bed. Patient unable to answers any questions this time. Patient then fall back asleep.   Sim BoastHavy, RN

## 2015-10-19 NOTE — Care Management Note (Signed)
Case Management Note  Patient Details  Name: Christine Burns MRN: [id number] Date of Birth: 12/26/1945  Subjective/Objective:                    Action/Plan: Patient was admitted with back pain. Recent PLIF on 09/15/15, s/p fall 1 week prior to this admission.  Patient was admitted from home.  Will follow for discharge needs pending PT/OT evals and physician orders.  Expected Discharge Date:  10/25/15               Expected Discharge Plan:     In-House Referral:     Discharge planning Services     Post Acute Care Choice:    Choice offered to:     DME Arranged:    DME Agency:     HH Arranged:    HH Agency:     Status of Service:  In process, will continue to follow  Medicare Important Message Given:    Date Medicare IM Given:    Medicare IM give by:    Date Additional Medicare IM Given:    Additional Medicare Important Message give by:     If discussed at Long Length of Stay Meetings, dates discussed:    Additional CommentsAnda Kraft:  Ashtian Villacis C, RN 10/19/2015, 12:15 PM [phone]

## 2015-10-19 NOTE — Progress Notes (Signed)
OT Cancellation Note  Patient Details Name: Christine Burns MRN: [id number] DOB: 09/13/1945   Cancelled Treatment:    Reason Eval/Treat Not Completed: Medical issues which prohibited therapy (lethargy limiting ability to participate. Discussed with RN and pt has been somnolent all day. Will only briefly arouse, make confused mumblings, and immediately back asleep. ) Will check back as schedule allows and as appropriate for OT evaluation. Thank you for the order.   Edwin CapPatricia Ryin Schillo , MS, OTR/L, CLT Pager: [phone]  10/19/2015, 3:30 PM

## 2015-10-19 NOTE — Progress Notes (Signed)
Patient noted awake when staffs repositioned at this time. Patient appears confused still mumbling a few words then fall back asleepy. Will continue to monitor.    Sim BoastHavy, RN

## 2015-10-19 NOTE — Progress Notes (Addendum)
Pt unresponsive to sternal rub or cool water on face.  Pt grimaces during deep sternal rub.  Respirations 10.  BP and O2 normal.  Pupils were sluggish to react.  Rapid response called to assess pt.  Nighttime meds not given since pt is too lethargic to safely take medications.  Will continue to monitor.   Estanislado EmmsAshley Schwarz, RN

## 2015-10-19 NOTE — Progress Notes (Addendum)
Dr. Jeral FruitBotero called to renew restraints.  MD ok for order to be renew. Patient still noted somnolent at this time. During rounds, when patient is awake, she appears confused and impulsive. MD will consult psychiatry and Hospitalist at this time for follow up.   Sim BoastHavy, RN

## 2015-10-19 NOTE — Progress Notes (Signed)
Patient ID: Christine MedicusRita K Burns, female   DOB: 03/23/1946, 70 y.o.   MRN: [id number] Resting at present  i will get a PSY consult..Marland Kitchen

## 2015-10-20 NOTE — Progress Notes (Signed)
Patient not alert enough to take morning medications.

## 2015-10-20 NOTE — Clinical Social Work Note (Signed)
Clinical Social Work Assessment  Patient Details  Name: Christine MedicusRita K Rewerts MRN: [id number] Date of Birth: 06/18/1946  Date of referral:  10/20/15               Reason for consult:  Facility Placement, Discharge Planning                Permission sought to share information with:  Family Supports Permission granted to share information::  Yes, Verbal Permission Granted  Name::      Museum/gallery curator(Vanita Pincher, daughter)  Agency::   (SNF)  Relationship::   (daughter)  Contact Information:   [phone])  Housing/Transportation Living arrangements for the past 2 months:  Single Family Home Source of Information:  Medical Team Patient Interpreter Needed:  None Criminal Activity/Legal Involvement Pertinent to Current Situation/Hospitalization:  No - Comment as needed Significant Relationships:  Adult Children, Other Family Members Lives with:  Self Do you feel safe going back to the place where you live?  No Need for family participation in patient care:  Yes (Comment)  Care giving concerns:   Patient's family has not expressed any care giving concerns at this time.   Social Worker assessment / plan:   Patient a/o x1 (self only) BSW intern has spoken with patient's niece, Vickie by phone to discuss discharge disposition. BSW intern made patient's niece aware of SNF recommendations given by PT. BSW intern further explained SNF process as well as insurance coverage. Patient is from home with daughter and granddaughter. Therefore, intermittent supervision will be provided once patient d/c from SNF. Patient's [facility] as well [facility]. BSW intern [place] [facility] [place]. BSW intern to f/u with patient's family once clinical have been reviewed by facility. Patient's scheduled d/c is unknown at this time. BSW intern remains available.   Employment status:  Retired Health and safety inspectornsurance information:  [employer] PT Recommendations:   Skilled Nursing Facility Information / Referral to community resources:  Skilled Nursing Facility  Patient/Family's Response to care:   Patient's niece appreciated Social Work intervention given by Office DepotBSW intern. Patient's niece is agreeable to SNF placement.  Patient/Family's Understanding of and Emotional Response to Diagnosis, Current Treatment, and Prognosis:   Patient's family understands [facility]  Emotional Assessment Appearance:   (unable to assess) Attitude/Demeanor/Rapport:  Unable to Assess Affect (typically observed):  Unable to Assess Orientation:  Oriented to Self Alcohol / Substance use:  Not Applicable Psych involvement (Current and /or in the community):  No (Comment)  Discharge Needs  Concerns to be addressed:  No discharge needs identified Readmission within the last 30 days:  No Current discharge risk:  None Barriers to Discharge:  No Barriers Identified   Orson Gearatiana Avyaan Summer, Student-SW 10/20/2015, 3:38 PM

## 2015-10-20 NOTE — Clinical Social Work Placement (Signed)
   CLINICAL SOCIAL WORK PLACEMENT  NOTE  Date:  10/20/2015  Patient Details  Name: Christine Burns MRN: [id number] Date of Birth: 08/20/1945  Clinical Social Work is seeking post-discharge placement for this patient at the Skilled  Nursing Facility level of care (*CSW will initial, date and re-position this form in  chart as items are completed):  Yes   Patient/family provided with [place] Clinical Social Work Department's list of facilities offering this level of care within the geographic area requested by the patient (or if unable, by the patient's family).  Yes   Patient/family informed of their freedom to choose among providers that offer the needed level of care, that participate in Medicare, Medicaid or managed care program needed by the patient, have an available bed and are willing to accept the patient.  Yes   Patient/family informed of 's ownership interest in [facility] and  [facility], as well as of the fact that they are under no obligation to receive care at these facilities.  PASRR submitted to EDS on       PASRR number received on       Existing PASRR number confirmed on       FL2 transmitted to all facilities in geographic area requested by pt/family on 10/20/15     FL2 transmitted to all facilities within larger geographic area on       Patient informed that his/her managed care company has contracts with or will negotiate with certain facilities, including the following:            Patient/family informed of bed offers received.  Patient chooses bed at       Physician recommends and patient chooses bed at      Patient to be transferred to   on  .  Patient to be transferred to facility by       Patient family notified on   of transfer.  Name of family member notified:        PHYSICIAN Please sign FL2     Additional Comment:    _______________________________________________ Christine Burns, Student-SW 10/20/2015, 3:18 PM

## 2015-10-20 NOTE — Evaluation (Signed)
Physical Therapy Evaluation Patient Details Name: Christine Burns MRN: [id number] DOB: 03/14/1946 Today's Date: 10/20/2015   History of Present Illness  Adm 10/17/15 after fall at home with incr lumbar spasms (pt s/p L2-5 PLIF 09/15/15) 3/26 pt with decr responsiveness, given narcan and then became agitated and required ativan. Pt became somnolent and has been difficult to arouse. PMHx-HTN, arthritis, fibromyalgia    Clinical Impression  Pt admitted with above symptoms and events. Patient currently with confusion, limited ability to attend to tasks, appears LUE weaker than RUE, and tardive dyskinesia-like/jerking movements of bil legs and torso (in sitting). Pt currently with functional limitations due to the deficits listed below (see PT Problem List). No family present to discuss discharge plans. If pt improves and family can provide 24/7 care at home, could consider CIR consult. Pt will benefit from skilled PT to increase their independence and safety with mobility to allow discharge to the venue listed below.       Follow Up Recommendations SNF;Supervision/Assistance - 24 hour    Equipment Recommendations  None recommended by PT    Recommendations for Other Services       Precautions / Restrictions Precautions Precautions: Back;Fall Precaution Comments: pt too lethargic, confused to educate      Mobility  Bed Mobility Overal bed mobility: Needs Assistance;+2 for physical assistance;+ 2 for safety/equipment Bed Mobility: Rolling;Sidelying to Sit;Sit to Sidelying Rolling: Total assist;+2 for physical assistance Sidelying to sit: Mod assist;+2 for safety/equipment;HOB elevated     Sit to sidelying: Mod assist;+2 for safety/equipment General bed mobility comments: vc for sequencing, inability to grasp and use rail with LUE, once legs assisted off bed, she did assist pushing up with rUE; on return to bed, she was able to lift both legs up onto bed; max cues to maintain  precautions  Transfers                 General transfer comment: deferred due to decr cognition, jerking movements of torso, and ?leg strength  Ambulation/Gait                Stairs            Wheelchair Mobility    Modified Rankin (Stroke Patients Only)       Balance Overall balance assessment: Needs assistance Sitting-balance support: No upper extremity supported;Feet unsupported Sitting balance-Leahy Scale: Zero Sitting balance - Comments: dyskinectic movements with min to max assist to maintain upright sitting                                     Pertinent Vitals/Pain Pain Assessment: Faces Faces Pain Scale: Hurts even more Pain Location: ?back; pt unable to state Pain Descriptors / Indicators: Grimacing Pain Intervention(s): Limited activity within patient's tolerance;Monitored during session;Repositioned    Home Living Family/patient expects to be discharged to:: Unsure                 Additional Comments: No family present; lived with daughter after back surgery?    Prior Function           Comments: fell PTA     Hand Dominance        Extremity/Trunk Assessment   Upper Extremity Assessment: Defer to OT evaluation           Lower Extremity Assessment: RLE deficits/detail;LLE deficits/detail;Difficult to assess due to impaired cognition RLE Deficits / Details: AAROM WFL; at least  3/5; noted bil restless/tardive dyskinesia-like movements (bends knees, straigtens, inverts feet, dorsiflexes, and twitching) LLE Deficits / Details: AAROM WFL; at least 3/5; noted bil restless/tardive dyskinesia-like movements (bends knees, straigtens, inverts feet, dorsiflexes, and twitching)  Cervical / Trunk Assessment: Other exceptions  Communication   Communication: Other (comment) (difficult to assess due to lethargy and confusion)  Cognition Arousal/Alertness: Lethargic Behavior During Therapy: Restless Overall Cognitive  Status: Impaired/Different from baseline Area of Impairment: Orientation;Attention;Memory;Following commands;Awareness;Problem solving Orientation Level: Place;Situation;Time Current Attention Level: Focused (brief periods of sustained) Memory: Decreased short-term memory;Decreased recall of precautions Following Commands: Follows one step commands inconsistently   Awareness:  (pre-intellectual) Problem Solving: Slow processing;Decreased initiation;Requires verbal cues;Requires tactile cues General Comments: Difficult to maintain arousal; answers "I don't know" to most questions. When given 2 answers to choose between, answers appropriately ~25%    General Comments General comments (skin integrity, edema, etc.): Lengthy discussion with RN and OT re: patient condition. OT to contact MD    Exercises        Assessment/Plan    PT Assessment Patient needs continued PT services  PT Diagnosis Generalized weakness;Altered mental status   PT Problem List Decreased strength;Decreased activity tolerance;Decreased balance;Decreased mobility;Decreased cognition;Decreased coordination;Decreased knowledge of precautions;Decreased safety awareness;Pain  PT Treatment Interventions DME instruction;Gait training;Functional mobility training;Therapeutic activities;Therapeutic exercise;Balance training;Neuromuscular re-education;Cognitive remediation;Patient/family education   PT Goals (Current goals can be found in the Care Plan section) Acute Rehab PT Goals Patient Stated Goal: none stated PT Goal Formulation: Patient unable to participate in goal setting Time For Goal Achievement: 11/03/15 Potential to Achieve Goals: Fair    Frequency Min 3X/week   Barriers to discharge        Co-evaluation               End of Session Equipment Utilized During Treatment: Oxygen Activity Tolerance: Patient limited by lethargy Patient left: in bed;with call bell/phone within reach;with bed alarm  set;with nursing/sitter in room Nurse Communication: Mobility status;Other (comment) (?left weaker than rt (esp UE); dyskinetic movements)         Time: [id number] (additional time later AM w/ OT deferred) PT Time Calculation (min) (ACUTE ONLY): 18 min   Charges:   PT Evaluation $PT Eval High Complexity: 1 Procedure     PT G Codes:        Doyle Tegethoff 10/20/2015, 12:01 PM  Pager [phone]

## 2015-10-20 NOTE — Care Management Important Message (Signed)
Important Message  Patient Details  Name: Christine MedicusRita K Storbeck MRN: [id number] Date of Birth: 01/04/1946   Medicare Important Message Given:  Yes    Emaline Karnes P Nazifa Trinka 10/20/2015, 12:50 PM

## 2015-10-20 NOTE — Evaluation (Signed)
Occupational Therapy Evaluation Patient Details Name: Christine Burns MRN: [id number] DOB: 1946/02/23 Today's Date: 10/20/2015    History of Present Illness Adm 10/17/15 after fall at home with incr lumbar spasms (pt s/p L2-5 PLIF 09/15/15) 3/26 pt with decr responsiveness, given narcan and then became agitated and required ativan. Pt became somnolent and has been difficult to arouse. PMHx-HTN, arthritis, fibromyalgia   Clinical Impression   Pt admitted with the above diagnosis and has the deficits that are listed below. Pt appears to have new neuro deficits on exam including possible LUE weakness/inattn, tardive dyskinetic movements in sitting of trunk and legs, significantly impaired cognition (intact before surgery 3 weeks ago) and therefore is unable to d/c home at this point.  If pt has family available to assist, she may benefit from CIR consult at some point.  If not, rec SNF due to severity of deficits. Pt would benefit from ongoing OT to attempt to increase pt's ability to follow simple commands and participate in basic adls so she can eventually be more involved in her own care.  Unsure if pt has 24/7 S at dc since no family available.    Follow Up Recommendations  SNF;Supervision/Assistance - 24 hour    Equipment Recommendations  None recommended by OT    Recommendations for Other Services       Precautions / Restrictions Precautions Precautions: Back;Fall Precaution Comments: pt too lethargic, confused to educate Restrictions Weight Bearing Restrictions: No      Mobility Bed Mobility Overal bed mobility: Needs Assistance;+2 for physical assistance;+ 2 for safety/equipment Bed Mobility: Rolling;Sidelying to Sit;Sit to Sidelying Rolling: Total assist;+2 for physical assistance Sidelying to sit: Mod assist;+2 for safety/equipment;HOB elevated     Sit to sidelying: Mod assist;+2 for safety/equipment General bed mobility comments: vc for sequencing, inability to grasp and use  rail with LUE, once legs assisted off bed, she did assist pushing up with rUE; on return to bed, she was able to lift both legs up onto bed; max cues to maintain precautions  Transfers Overall transfer level: Needs assistance               General transfer comment: deferred due to decr cognition, jerking movements of torso, and ?leg strength    Balance Overall balance assessment: Needs assistance Sitting-balance support: No upper extremity supported;Feet unsupported Sitting balance-Leahy Scale: Zero Sitting balance - Comments: dyskinectic movements with min to max assist to maintain upright sitting                                    ADL Overall ADL's : Needs assistance/impaired Eating/Feeding: Total assistance;Sitting   Grooming: Total assistance;Bed level Grooming Details (indicate cue type and reason): attempted to wash face and comb hair and only could do with full hand over hand assist. Upper Body Bathing: Total assistance;Sitting   Lower Body Bathing: Total assistance;+2 for physical assistance;Sit to/from stand   Upper Body Dressing : Total assistance;Sitting   Lower Body Dressing: +2 for physical assistance;Total assistance;Sit to/from Database administrator:  (declined due to tardive dyskinesia movements in sitting.)           Functional mobility during ADLs: Maximal assistance General ADL Comments: No family available.  Pt currenlty unable to do any adls due to cognitive status and new tardive dyskenetic movements in B legs and trunk.  Pt was admitted 3 weeks ago cognitively intact for back surgery and  d/c'd home intack.     Vision Vision Assessment?:  (unable to test) Additional Comments: Pt kept squeezing eyes closed throughout evaluation.  Pt would then open and look around (able to look both directions with cues) and then would squeeze eyes closed again.   Perception Perception Perception Tested?: No   Praxis Praxis Praxis tested?: Not  tested    Pertinent Vitals/Pain Pain Assessment: Faces Faces Pain Scale: Hurts even more Pain Location: back Pain Descriptors / Indicators: Grimacing Pain Intervention(s): Limited activity within patient's tolerance;Monitored during session;Repositioned     Hand Dominance Right (appears to be R but unsure b/c not attending to L.)   Extremity/Trunk Assessment Upper Extremity Assessment Upper Extremity Assessment: LUE deficits/detail LUE Deficits / Details: Pt appearing to not use LUE as much as R.  Pt holds LUE in dependent position and often ignores it.  When placed on bedrail to use to push up it immediately fell to pt's side. LUE Coordination: decreased fine motor;decreased gross motor   Lower Extremity Assessment Lower Extremity Assessment: Defer to PT evaluation RLE Deficits / Details: AAROM WFL; at least 3/5; noted bil restless/tardive dyskinesia-like movements (bends knees, straigtens, inverts feet, dorsiflexes, and twitching) LLE Deficits / Details: AAROM WFL; at least 3/5; noted bil restless/tardive dyskinesia-like movements (bends knees, straigtens, inverts feet, dorsiflexes, and twitching)   Cervical / Trunk Assessment Cervical / Trunk Assessment: Other exceptions Cervical / Trunk Exceptions: in sitting pt with tardive dyskinesia-like movements of torso (repeatedly jerking, twitching in all directions   Communication Communication Communication: Other (comment) (unsure due to poor cognition.)   Cognition Arousal/Alertness: Lethargic Behavior During Therapy: Restless Overall Cognitive Status: Impaired/Different from baseline Area of Impairment: Orientation;Attention;Memory;Following commands;Safety/judgement;Awareness;Problem solving Orientation Level: Place;Time;Situation Current Attention Level: Focused Memory: Decreased short-term memory;Decreased recall of precautions Following Commands: Follows one step commands inconsistently Safety/Judgement: Decreased awareness  of safety;Decreased awareness of deficits Awareness:  (not yet intellectual) Problem Solving: Slow processing;Decreased initiation;Difficulty sequencing;Requires verbal cues;Requires tactile cues General Comments: Difficult to maintain arousal; answers "I don't know" to most questions. When given 2 answers to choose between, answers appropriately ~25%   General Comments       Exercises       Shoulder Instructions      Home Living Family/patient expects to be discharged to:: Unsure                                 Additional Comments: No family present; lived with daughter after back surgery?      Prior Functioning/Environment          Comments: Larey SeatFell PTA    OT Diagnosis: Generalized weakness;Cognitive deficits;Acute pain;Hemiplegia non-dominant side   OT Problem List: Decreased strength;Decreased range of motion;Decreased activity tolerance;Impaired balance (sitting and/or standing);Decreased coordination;Decreased cognition;Decreased safety awareness;Decreased knowledge of use of DME or AE;Decreased knowledge of precautions;Impaired UE functional use;Pain   OT Treatment/Interventions: Self-care/ADL training;Neuromuscular education;Cognitive remediation/compensation;DME and/or AE instruction;Balance training    OT Goals(Current goals can be found in the care plan section) Acute Rehab OT Goals Patient Stated Goal: unable to state OT Goal Formulation: Patient unable to participate in goal setting Time For Goal Achievement: 11/03/15 Potential to Achieve Goals: Fair ADL Goals Pt Will Perform Eating: with min assist;sitting Pt Will Perform Grooming: with min assist;sitting Pt Will Transfer to Toilet: with mod assist;bedside commode;stand pivot transfer Additional ADL Goal #1: pt will follow 75% of simple one step commands to increase participation in OT . Additional  ADL Goal #2: Pt will scan sink counter and name all adl items needed for a particular grooming task  with min assist. Additional ADL Goal #3: Pt will answer 3/3 basic orientation questions with 100% accuracy to increase awareness of surroundings.  OT Frequency: Min 2X/week   Barriers to D/C: Other (comment) (unsure of family situation.)          Co-evaluation PT/OT/SLP Co-Evaluation/Treatment: Yes Reason for Co-Treatment: Complexity of the patient's impairments (multi-system involvement);Necessary to address cognition/behavior during functional activity;For patient/therapist safety PT goals addressed during session: Mobility/safety with mobility;Balance OT goals addressed during session: ADL's and self-care      End of Session Nurse Communication: Mobility status;Other (comment) (need for neuro consult??)  Activity Tolerance: Patient limited by lethargy Patient left: in bed;with call bell/phone within reach;with bed alarm set   Time: 1610-9604 OT Time Calculation (min): 30 min Charges:  OT General Charges $OT Visit: 1 Procedure OT Evaluation $OT Eval Moderate Complexity: 1 Procedure G-Codes:    Hope Budds Nov 08, 2015, 12:59 PM  [phone]

## 2015-10-20 NOTE — Progress Notes (Signed)
Patient ID: Christine Burns, female   DOB: 08/30/1945, 70 y.o.   MRN: [id number] Christine Burns, no talking, no weakness . Getting some pt. Social worker to se if we can get a psch consult

## 2015-10-20 NOTE — NC FL2 (Signed)
[place] MEDICAID FL2 LEVEL OF CARE SCREENING TOOL     IDENTIFICATION  Patient Name: Christine MedicusRita K Burns Birthdate: 07/26/1945 Sex: female Admission Date (Current Location): 10/17/2015  [place]  [place]  ([place]/HU[place]NA MEDICARE HMO- [id number]) Facility and Address:  [facility], [address], [place], [place] [address]      Provider Number: [id number]  Attending Physician Name and Address:  Hilda LiasErnesto Botero, MD  Relative Name and Phone Number:   London Sheer(Vanita Pincher, daughter - [phone])    Current Level of Care: Hospital Recommended Level of Care: Skilled Nursing Facility Prior Approval Number:    Date Approved/Denied:   PASRR Number:    Discharge Plan: SNF    Current Diagnoses: Patient Active Problem List   Diagnosis Date Noted  . Back pain 10/17/2015  . Lumbar radiculopathy 10/17/2015  . Lumbar degenerative disc disease 09/15/2015    Orientation RESPIRATION BLADDER Height & Weight     Self  O2 (2L/min) Indwelling catheter Weight: 150 lb (68.04 kg) Height:  5\' 3"  (160 cm)  BEHAVIORAL SYMPTOMS/MOOD NEUROLOGICAL BOWEL NUTRITION STATUS      Continent Diet (REGULAR)  AMBULATORY STATUS COMMUNICATION OF NEEDS Skin   Extensive Assist   Normal                       Personal Care Assistance Level of Assistance  Total care       Total Care Assistance: Maximum assistance   Functional Limitations Info             SPECIAL CARE FACTORS FREQUENCY  PT (By licensed PT), OT (By licensed OT)     PT Frequency:  (3x/week) OT Frequency:  (2x/week)            Contractures      Additional Factors Info  Code Status, Allergies Code Status Info:  (FULL) Allergies Info:  (Fentanyl, Ibuprofen, Diazepam, Zolpidem Tartrate, Meperidine, Morphine)           Current Medications (10/20/2015):  This is the current hospital active medication list Current Facility-Administered Medications  Medication Dose Route  Frequency Provider Last Rate Last Dose  . 0.9 %  sodium chloride infusion  250 mL Intravenous Continuous Hilda LiasErnesto Botero, MD      . 0.9 % NaCl with KCl 20 mEq/ L  infusion   Intravenous Continuous Hilda LiasErnesto Botero, MD 75 mL/hr at 10/19/15 2224    . acetaminophen (TYLENOL) tablet 650 mg  650 mg Oral Q4H PRN Hilda LiasErnesto Botero, MD       Or  . acetaminophen (TYLENOL) suppository 650 mg  650 mg Rectal Q4H PRN Hilda LiasErnesto Botero, MD      . amphetamine-dextroamphetamine (ADDERALL XR) 24 hr capsule 30 mg  30 mg Oral QAC breakfast Hilda LiasErnesto Botero, MD   Stopped at 10/19/15 0800  . baclofen (LIORESAL) tablet 20 mg  20 mg Oral TID Hilda LiasErnesto Botero, MD   Stopped at 10/20/15 1000  . DULoxetine (CYMBALTA) DR capsule 60 mg  60 mg Oral BID Hilda LiasErnesto Botero, MD   60 mg at 10/19/15 2219  . levothyroxine (SYNTHROID, LEVOTHROID) tablet 100 mcg  100 mcg Oral QAC breakfast Hilda LiasErnesto Botero, MD   Stopped at 10/19/15 0800  . loratadine (CLARITIN) tablet 10 mg  10 mg Oral Daily PRN Hilda LiasErnesto Botero, MD      . menthol-cetylpyridinium (CEPACOL) lozenge 3 mg  1 lozenge Oral PRN Hilda LiasErnesto Botero, MD       Or  . phenol (CHLORASEPTIC) mouth  spray 1 spray  1 spray Mouth/Throat PRN Hilda Lias, MD      . naloxone [facility]) injection 0.4 mg  0.4 mg Intravenous PRN Hilda Lias, MD   0.2 mg at 10/18/15 1240  . ondansetron (ZOFRAN) injection 4 mg  4 mg Intravenous Q4H PRN Hilda Lias, MD      . oxyCODONE (Oxy IR/ROXICODONE) immediate release tablet 15 mg  15 mg Oral Q4H PRN Hilda Lias, MD   15 mg at 10/18/15 0019  . pregabalin (LYRICA) capsule 100 mg  100 mg Oral BID Hilda Lias, MD   100 mg at 10/19/15 2219  . propranolol ER (INDERAL LA) 24 hr capsule 120 mg  120 mg Oral QAC breakfast Hilda Lias, MD   Stopped at 10/19/15 0800  . senna (SENOKOT) tablet 8.6 mg  1 tablet Oral BID Hilda Lias, MD   8.6 mg at 10/19/15 2219  . sodium chloride flush (NS) 0.9 % injection 3 mL  3 mL Intravenous Q12H Hilda Lias, MD   3 mL at 10/19/15  2223  . sodium chloride flush (NS) 0.9 % injection 3 mL  3 mL Intravenous PRN Hilda Lias, MD      . traZODone (DESYREL) tablet 50 mg  50 mg Oral QHS Hilda Lias, MD   50 mg at 10/19/15 2219  . zolpidem (AMBIEN) tablet 5 mg  5 mg Oral QHS PRN Hilda Lias, MD         Discharge Medications: Please see discharge summary for a list of discharge medications.  Relevant Imaging Results:  Relevant Lab Results:   Additional Information  (SSN: [id number])  Orson Gear, Student-SW [phone]

## 2015-10-21 DIAGNOSIS — F0631 Mood disorder due to known physiological condition with depressive features: Secondary | ICD-10-CM | POA: Diagnosis present

## 2015-10-21 DIAGNOSIS — M797 Fibromyalgia: Secondary | ICD-10-CM

## 2015-10-21 DIAGNOSIS — F329 Major depressive disorder, single episode, unspecified: Secondary | ICD-10-CM

## 2015-10-21 MED ORDER — LOPERAMIDE HCL 2 MG PO CAPS: 2.0000 mg | ORAL_CAPSULE | ORAL | Status: DC | PRN

## 2015-10-21 NOTE — Clinical Social Work Note (Signed)
[facility] MUST PASARR under manual review. [facility] reviewing referral and will start insurance authorization if able to extend bed offer.  CSW remains available as needed.   Derenda FennelBashira Cordera Stineman, MSW, LCSWA [phone] 10/21/2015 8:27 AM

## 2015-10-21 NOTE — Consult Note (Signed)
[facility] Face-to-Face Psychiatry Consult   Reason for Consult:  Depression, fibromyalgia chronic back pain and non cooperative with treatment Referring Physician:  Dr. Jeral Fruit Patient Identification: Christine Burns MRN:  [id number] Principal Diagnosis: Other depression due to general medical condition Diagnosis:   Patient Active Problem List   Diagnosis Date Noted  . Other depression due to general medical condition [F32.9] 10/21/2015  . Back pain [M54.9] 10/17/2015  . Lumbar radiculopathy [M54.16] 10/17/2015  . Lumbar degenerative disc disease [M51.36] 09/15/2015    Total Time spent with patient: 1 hour  Subjective:   Christine Burns is a 70 y.o. female patient admitted with back pain and accidental overdose of pain medication.   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 Patient briefly responded to Narcan with the violent behaviors on admission. Patient reported that she has been taking medication every 4 hours as prescribed to her and could not conform she has accidentally overdosed medication or not. Reportedly they should has been suffering with depression, anxiety and fibromyalgia since 2005 and was use to take over the counter pain medications like Advil and Tylenol PM and also overdosed in 2007 and [facility] by [facility] clearance and [facility]. Patient is currently on disability and lives with her daughter and granddaughter was 68 years old.   Patient seeking medication management for pain from Dr. Dawna Part at [facility] over the years. Patient denies current symptoms of depression, anxiety, mania, auditory/visual hallucinations, delusions or paranoia. She denied suicidal/homicidal ideation, intention or plans. Patient has intact cognitions including orientation, concentration, memory and language functions.  Patient stated she likes to take medication as Dr. Dawna Part prescribed to her and does not want to make any changes at this time. Patient seems to be involved with a polypharmacy by taking Adderall, opioid medications, muscle relaxants and on Synthroid. Spoke with Dr. Jeral Fruit and recommended less medication is better for her pain control and to prevent further accidental overdose and possible addiction issues.   Patient stated stresses are not getting along with her daughter who did not work over 8 years even though had Nurse, learning disability and has financial difficulties at home.   Past Psychiatric History: Diagnosed with fibromyalgia, depression and anxiety. Admitted to [facility] in 2007.   Risk to Self: Is patient at risk for suicide?: No Risk to Others:   Prior Inpatient Therapy:   Prior Outpatient Therapy:    Past Medical History:  Past Medical History  Diagnosis Date  . Hypertension   . Arthritis     back & knees & fingers   . Chronic kidney disease     due to increased use of NSAIDS in the past, appt. with [facility] x1, but now is managed by PCP- Nyland    . Hypothyroidism   . Anxiety   . Depression   . Fibromyalgia     tremors    Past Surgical History  Procedure Laterality Date  . Back surgery      cerv. fusion, low back surgery for stenosis   . Abdominal hysterectomy    . Appendectomy    . Breast reduction surgery Bilateral 1990's   Family History: No family history on file. Family Psychiatric  History: Patient daughter has been diagnosed with fibromyalgia. Social History:  History  Alcohol Use No     History  Drug Use  No    Social History   Social History  . Marital Status: Divorced    Spouse Name: N/A  . Number of Children: N/A  . Years of Education: N/A   Social History Main Topics  . Smoking status: Never Smoker   . Smokeless tobacco: None  . Alcohol Use: No  . Drug Use: No  . Sexual Activity: Not Asked   Other Topics Concern  . None   Social History Narrative    Additional Social History:    Allergies:   Allergies  Allergen Reactions  . Fentanyl Hypertension  . Ibuprofen Other (See Comments)    Kidney damage  . Diazepam Other (See Comments)      SLEEP WALKING, hallucinations  . Zolpidem Tartrate Nausea And Vomiting  . Meperidine Nausea Only  . Morphine Rash    Labs: No results found for this or any previous visit (from the past 48 hour(s)).  Current Facility-Administered Medications  Medication Dose Route Frequency Provider Last Rate Last Dose  . 0.9 %  sodium chloride infusion  250 mL Intravenous Continuous Hilda LiasErnesto Botero, MD      . 0.9 % NaCl with KCl 20 mEq/ L  infusion   Intravenous Continuous Hilda LiasErnesto Botero, MD 75 mL/hr at 10/19/15 2224    . acetaminophen (TYLENOL) tablet 650 mg  650 mg Oral Q4H PRN Hilda LiasErnesto Botero, MD   650 mg at 10/20/15 2105   Or  . acetaminophen (TYLENOL) suppository 650 mg  650 mg Rectal Q4H PRN Hilda LiasErnesto Botero, MD      . amphetamine-dextroamphetamine (ADDERALL XR) 24 hr capsule 30 mg  30 mg Oral QAC breakfast Hilda LiasErnesto Botero, MD   30 mg at 10/21/15 0810  . baclofen (LIORESAL) tablet 20 mg  20 mg Oral TID Hilda LiasErnesto Botero, MD   20 mg at 10/21/15 1014  . DULoxetine (CYMBALTA) DR capsule 60 mg  60 mg Oral BID Hilda LiasErnesto Botero, MD   60 mg at 10/21/15 1014  . levothyroxine (SYNTHROID, LEVOTHROID) tablet 100 mcg  100 mcg Oral QAC breakfast Hilda LiasErnesto Botero, MD   100 mcg at 10/21/15 0810  . loratadine (CLARITIN) tablet 10 mg  10 mg Oral Daily PRN Hilda LiasErnesto Botero, MD      . menthol-cetylpyridinium (CEPACOL) lozenge 3 mg  1 lozenge Oral PRN Hilda LiasErnesto Botero, MD       Or  . phenol (CHLORASEPTIC) mouth spray 1 spray  1 spray Mouth/Throat PRN Hilda LiasErnesto Botero, MD      . naloxone [facility](NARCAN) injection 0.4 mg  0.4 mg Intravenous PRN Hilda LiasErnesto Botero, MD   0.2 mg at 10/18/15 1240  . ondansetron (ZOFRAN) injection 4 mg  4 mg Intravenous Q4H PRN Hilda LiasErnesto Botero, MD      . oxyCODONE (Oxy IR/ROXICODONE) immediate release tablet 15 mg  15 mg Oral  Q4H PRN Hilda LiasErnesto Botero, MD   15 mg at 10/21/15 1018  . pregabalin (LYRICA) capsule 100 mg  100 mg Oral BID Hilda LiasErnesto Botero, MD   100 mg at 10/21/15 1014  . propranolol ER (INDERAL LA) 24 hr capsule 120 mg  120 mg Oral QAC breakfast Hilda LiasErnesto Botero, MD   120 mg at 10/21/15 0810  . senna (SENOKOT) tablet 8.6 mg  1 tablet Oral BID Hilda LiasErnesto Botero, MD   8.6 mg at 10/21/15 1014  . sodium chloride flush (NS) 0.9 % injection 3 mL  3 mL Intravenous Q12H Hilda LiasErnesto Botero, MD   3 mL at 10/19/15 2223  . sodium chloride flush (NS) 0.9 % injection 3 mL  3 mL Intravenous PRN Hilda Lias, MD      . traZODone (DESYREL) tablet 50 mg  50 mg Oral QHS Hilda Lias, MD   50 mg at 10/20/15 2105    Musculoskeletal: Strength & Muscle Tone: decreased Gait & Station: normal Patient leans: N/A  Psychiatric Specialty Exam: ROS  chronic low back pain, status post lumbar radiculopathy, denied nausea, vomiting, shortness of breath and chest pain. No Fever-chills, No Headache, No changes with Vision or hearing, reports vertigo No problems swallowing food or Liquids, No Chest pain, Cough or Shortness of Breath, No Abdominal pain, No Nausea or Vommitting, Bowel movements are regular, No Blood in stool or Urine, No dysuria, No new skin rashes or bruises, No new joints pains-aches,  No new weakness, tingling, numbness in any extremity, No recent weight gain or loss, No polyuria, polydypsia or polyphagia,   A full 10 point Review of Systems was done, except as stated above, all other Review of Systems were negative.  Blood pressure 138/79, pulse 75, temperature 97.2 F (36.2 C), temperature source Oral, resp. rate 18, height  (1.6 m), weight 68.04 kg (150 lb), SpO2 97 %.Body mass index is 26.58 kg/(m^2).  General Appearance: Casual  Eye Contact::  Good  Speech:  Clear and Coherent  Volume:  Normal  Mood:  Euthymic  Affect:  Appropriate and Congruent  Thought Process:  Coherent and Goal Directed   Orientation:  Full (Time, Place, and Person)  Thought Content:  WDL  Suicidal Thoughts:  No  Homicidal Thoughts:  No  Memory:  Immediate;   Good Recent;   Fair Remote;   Fair  Judgement:  Fair  Insight:  Good  Psychomotor Activity:  Normal  Concentration:  Good  Recall:  Good  Fund of Knowledge:Good  Language: Good  Akathisia:  Negative  Handed:  Right  AIMS (if indicated):     Assets:  Communication Skills Desire for Improvement Financial Resources/Insurance Housing Leisure Time Resilience Social Support Talents/Skills Transportation Vocational/Educational  ADL's:  Intact  Cognition: WNL  Sleep:      Treatment Plan Summary: Patient has been suffering with chronic back pain and fibromyalgia, status post lumbar radiculopathy about a month ago and seems to be accidentally overdosing pain medication before admitted to the hospital and fell. Patient has been with improved mental status at the current medication management and willing to follow up with outpatient medication management.  Patient has intact cognitions and meets criteria for capacity to make her own medical decisions and living arrangements.  Recommended try to avoid polypharmacy, and off label use of medications if possible due to history of abuse and dependence in the past.  Appreciate psychiatric consultation and we sign off as of today Please contact [phone] or [phone] if needs further assistance  Disposition: Patient does not meet criteria for psychiatric inpatient admission. Supportive therapy provided about ongoing stressors.  Nehemiah Settle., MD 10/21/2015 1:51 PM

## 2015-10-21 NOTE — Progress Notes (Signed)
Pt apologized to nurse about her behavior on Sunday 3/26, pt reports that she chipped her right front tooth on Sunday. Part of tooth is missing. Documentation from Sunday states pt was biting IV tubing with her teeth, unsure if this cause her tooth to chip.

## 2015-10-21 NOTE — Progress Notes (Signed)
Psych md at bedside.

## 2015-10-21 NOTE — Clinical Social Work Note (Signed)
H/P, FL-2 and 30 day [facility] to [facility] for PASARR review.   [facility].   Derenda FennelBashira Noriah Osgood, MSW, LCSWA [phone] 10/21/2015 5:38 PM

## 2015-10-21 NOTE — Progress Notes (Signed)
Patient ID: Christine MedicusRita K Trueba, female   DOB: 09/15/1945, 70 y.o.   MRN: [id number] Scharlene GlossStable,no new complains. Seen by behavior medicine. Plan to dc in am

## 2015-10-21 NOTE — Progress Notes (Signed)
Pt requesting to sit in recliner. Pt assisted to recliner. Chair alarm in place and on.

## 2015-10-21 NOTE — Progress Notes (Signed)
Physical Therapy Treatment Patient Details Name: Christine Burns MRN: [id number] DOB: 1946/01/06 Today's Date: 10/21/2015    History of Present Illness Adm 10/17/15 after fall at home with incr lumbar spasms (pt s/p L2-5 PLIF 09/15/15) 3/26 pt with decr responsiveness, given narcan and then became agitated and required ativan. Pt became somnolent and has been difficult to arouse. 3/29 pt alert, talking, following commandsPMHx-HTN, arthritis, fibromyalgia    PT Comments    Patient now fully awake. Oriented x 2 (not time or situation). Required min assist with ambulation due to unsteadiness. Pt attempting to gather items to use while up in chair and consistently dropped items from Lt hand (x4).    Follow Up Recommendations  Supervision/Assistance - 24 hour;Home health PT (per pt, her family can provide 24/7 (need to confirm--if not able to provide 24/7, would recommend SNF)     Equipment Recommendations  None recommended by PT    Recommendations for Other Services       Precautions / Restrictions Precautions Precautions: Back;Fall Precaution Comments: pt able to state precautions, multiple cues to adhere Required Braces or Orthoses:  (no brace per orders)    Mobility  Bed Mobility Overal bed mobility: Needs Assistance;+2 for physical assistance;+ 2 for safety/equipment Bed Mobility: Sidelying to Sit;Rolling Rolling: Supervision Sidelying to sit: HOB elevated;Min guard       General bed mobility comments: vc for sequencing and maintain precautions; +rail and HOB 20  Transfers Overall transfer level: Needs assistance Equipment used: None Transfers: Sit to/from Stand Sit to Stand: Min assist         General transfer comment: pt reports she was not using RW at home; does not remember falling or how she fell; unsteady wtih stagger step and assist to recover  Ambulation/Gait Ambulation/Gait assistance: Min assist Ambulation Distance (Feet): 100 Feet Assistive device:  None Gait Pattern/deviations: Step-through pattern;Decreased stride length;Drifts right/left Gait velocity: reduced   General Gait Details: use of gait belt to maintain balance, no HHA with pt mildly unsteady   Stairs            Wheelchair Mobility    Modified Rankin (Stroke Patients Only)       Balance Overall balance assessment: Needs assistance Sitting-balance support: No upper extremity supported;Feet supported Sitting balance-Leahy Scale: Normal Sitting balance - Comments: pt bringing feet up to her knee to don underpants and pants   Standing balance support: No upper extremity supported Standing balance-Leahy Scale: Fair                      Cognition Arousal/Alertness: Awake/alert Behavior During Therapy: Impulsive Overall Cognitive Status: Impaired/Different from baseline Area of Impairment: Orientation;Attention;Memory;Following commands;Awareness;Problem solving;Safety/judgement Orientation Level: Situation;Time Current Attention Level: Sustained (brief periods of sustained) Memory: Decreased short-term memory;Decreased recall of precautions Following Commands: Follows one step commands consistently Safety/Judgement: Decreased awareness of safety;Decreased awareness of deficits Awareness:  (pre-intellectual) Problem Solving: Requires verbal cues;Requires tactile cues General Comments: Poor awareness of her deficits "why do I keep dropping things?"     Exercises      General Comments        Pertinent Vitals/Pain Pain Assessment: Faces Faces Pain Scale: Hurts little more Pain Location: Lt lumbar area Pain Descriptors / Indicators: Discomfort Pain Intervention(s): Limited activity within patient's tolerance;Monitored during session;Premedicated before session;Repositioned    Home Living                      Prior Function  PT Goals (current goals can now be found in the care plan section) Acute Rehab PT Goals Patient  Stated Goal: get better PT Goal Formulation: Patient unable to participate in goal setting Time For Goal Achievement: 10/28/15 Potential to Achieve Goals: Good Progress towards PT goals: Goals met and updated - see care plan    Frequency  Min 3X/week    PT Plan Discharge plan needs to be updated    Co-evaluation             End of Session   Activity Tolerance: Patient tolerated treatment well Patient left: with call bell/phone within reach;in chair;with chair alarm set     Time: 1135-1153 PT Time Calculation (min) (ACUTE ONLY): 18 min  Charges:  $Gait Training: 8-22 mins                    G Codes:      Ladell Bey Nov 03, 2015, 12:17 PM Pager [phone]

## 2015-10-21 NOTE — Clinical Social Work Note (Signed)
[facility] 30 DAY NOTE placed on chart for MD signature.   Derenda FennelBashira Desman Polak, MSW, LCSWA [phone] 10/21/2015 11:53 AM

## 2015-10-21 NOTE — Progress Notes (Signed)
Pt did have an episode of deep sleep, took a lot of stimulation to wake pt up. Family member had been sitting at bedside for 20 minutes before pt woke up. Upon waking pt alert and oriented x4.

## 2015-10-21 NOTE — Progress Notes (Signed)
Pt ate bread off of her lunch tray, did not eat anything else.

## 2015-10-21 NOTE — Progress Notes (Signed)
Dr Jeral FruitBotero gave verbal order for foley to be removed.

## 2015-10-22 ENCOUNTER — Inpatient Hospital Stay (HOSPITAL_COMMUNITY): Payer: Medicare HMO

## 2015-10-22 NOTE — Clinical Social Work Note (Signed)
 [facility] PASSAR obtained: [id number]  Clinical Social Worker will sign off for now as social work intervention is no longer needed. Please consult us again if new need arises.  Derenda FennelBashira Adelina Collard, MSW, LCSWA [phone] 10/22/2015 11:28 AM

## 2015-10-22 NOTE — Clinical Social Work Placement (Signed)
   CLINICAL SOCIAL WORK PLACEMENT  NOTE  Date:  10/22/2015  Patient Details  Name: Christine Burns MRN: [id number] Date of Birth: 11/28/1945  Clinical Social Work is seeking post-discharge placement for this patient at the Skilled  Nursing Facility level of care (*CSW will initial, date and re-position this form in  chart as items are completed):  Yes   Patient/family provided with [place] Clinical Social Work Department's list of facilities offering this level of care within the geographic area requested by the patient (or if unable, by the patient's family).  Yes   Patient/family informed of their freedom to choose among providers that offer the needed level of care, that participate in Medicare, Medicaid or managed care program needed by the patient, have an available bed and are willing to accept the patient.  Yes   Patient/family informed of [facility]'s ownership interest in [facility] and [facility], as well as of the fact that they are under no obligation to receive care at these facilities.  PASRR submitted to E[facility] on 10/21/15     PASRR number received on 10/22/15     Existing PASRR number confirmed on       FL2 transmitted to all facilities in geographic area requested by pt/family on 10/20/15     FL2 transmitted to all facilities within larger geographic area on       Patient informed that his/her managed care company has contracts with or will negotiate with certain facilities, including the following:        Yes   Patient/family informed of bed offers received.  Patient [facility] bed at  [facility] )     Physician recommends and patient chooses bed at      Patient to be transferred to  [facility] ) on 10/22/15.  Patient to be transferred to facility by  [facility] )     Patient family notified on 10/22/15 of transfer.  Name of family member notified:   (Pt's niece, Christine Burns )     PHYSICIAN Please sign FL2     Additional  Comment:    _______________________________________________ Vaughan BrownerNixon, Marquies Wanat A, LCSW 10/22/2015, 11:20 AM

## 2015-10-22 NOTE — Progress Notes (Signed)
Patient ID: Christine Burns, female   DOB: 02/18/1946, 70 y.o.   MRN: [id number] Awake this am then developed weakness left arm. discharge on hold ct head no cva. Neuro to see her as weqll as GU secondary to retention

## 2015-10-22 NOTE — Progress Notes (Deleted)
Pharmacy instructed patient on how to use lovenox

## 2015-10-22 NOTE — Progress Notes (Signed)
Patient complained of urgency and pressure on bladder. Patient ambulated to bathroom, unable to void. Bladder scan showed >600 mL. In and out catheter placed with assist of Robin, RN. 800 mL clear yellow no odor drained. Removed and patient cleaned. Patient is now up in bed watching tv. Will continue to monitor.

## 2015-10-22 NOTE — Progress Notes (Signed)
MD reviewed CT. Per Md, neurology to be consulted. Patient passed stroke swallow screen.

## 2015-10-22 NOTE — Progress Notes (Signed)
Multiple attempts to get patient awake enough to take medications, Patient would not remain awake long enough to attempt PO medication with concern if pt could even protect airway. Several more attempts to get patiient to wake up and finally indicated she did want any medication.

## 2015-10-22 NOTE — Clinical Social Work Note (Signed)
Clinical Social Worker facilitated patient discharge including contacting patient family and facility to confirm patient discharge plans.  Clinical information faxed to facility and family agreeable with plan.  CSW arranged ambulance transport via [facility].  RN to call report prior to discharge.  Clinical Social Worker will sign off for now as social work intervention is no longer needed. Please consult us again if new need arises.  Derenda FennelBashira Zoee Heeney, MSW, LCSWA [phone] 10/22/2015 11:20 AM

## 2015-10-22 NOTE — Progress Notes (Signed)
Upon speaking with patient, she continuously says "betadine" to answer questions. When asked her name, patient states "my betadine" then corrects and states name is Christine Burns. Patient does not answer any other questions with appropriate answers, does not make corrections to answers, only states "betadine." Patient also unable to fully follow directions. When asked to keep leg in air, patient says "okay" then drops leg to bed. Left hand grip weakness was also noted. MD paged.

## 2015-10-22 NOTE — Clinical Social Work Note (Signed)
Discharge canceled. Facility aware. CSW remains available as needed.   Derenda FennelBashira Meela Wareing, MSW, LCSWA [phone] 10/22/2015 3:59 PM

## 2015-10-22 NOTE — Discharge Summary (Signed)
Physician Discharge Summary  Patient ID: Christine Burns MRN: [id number] DOB/AGE: 70/08/1945 70 y.o.  Admit date: 10/17/2015 Discharge date: 10/22/2015  Admission Diagnoses:lumbar radiculopathy  depression  Discharge Diagnoses:  Principal Problem:   Other depression due to general medical condition Active Problems:   Back pain   Lumbar radiculopathy   Discharged Condition: no pain Hospital Course: pain control  Consults: Psich  Significant Diagnostic Studies: none  Treatments: pain control  Discharge Exam: Blood pressure 140/74, pulse 57, temperature 98.4 F (36.9 C), temperature source Axillary, resp. rate 18, height 5\' 3"  (1.6 m), weight 68.04 kg (150 lb), SpO2 100 %. No weakness  Disposition: SNF     Medication List    ASK your doctor about these medications        amphetamine-dextroamphetamine 30 MG 24 hr capsule  Commonly known as:  ADDERALL XR  Take 30 mg by mouth daily before breakfast.     cyclobenzaprine 10 MG tablet  Commonly known as:  FLEXERIL  Take 1 tablet (10 mg total) by mouth 3 (three) times daily.     diazepam 5 MG tablet  Commonly known as:  VALIUM  Take 1 tablet (5 mg total) by mouth every 6 (six) hours as needed for muscle spasms.     DULoxetine 60 MG capsule  Commonly known as:  CYMBALTA  Take 60 mg by mouth 2 (two) times daily.     levothyroxine 100 MCG tablet  Commonly known as:  SYNTHROID, LEVOTHROID  Take 100 mcg by mouth daily.     loratadine 10 MG tablet  Commonly known as:  CLARITIN  Take 10 mg by mouth daily as needed for allergies.     LYRICA 100 MG capsule  Generic drug:  pregabalin  Take 100 mg by mouth 2 (two) times daily.     methadone 10 MG tablet  Commonly known as:  DOLOPHINE  Take 20-30 mg by mouth 2 (two) times daily. 30mg  in the morning, 20mg  in the evening     mometasone 50 MCG/ACT nasal spray  Commonly known as:  NASONEX  uses 1 spray in each nostril at bedtime     Oxycodone HCl 10 MG Tabs  Take 10 mg by  mouth 2 (two) times daily.     oxyCODONE-acetaminophen 5-325 MG tablet  Commonly known as:  PERCOCET/ROXICET  Take 1-2 tablets by mouth every 4 (four) hours as needed for moderate pain.     propranolol ER 120 MG 24 hr capsule  Commonly known as:  INDERAL LA  Take 120 mg by mouth daily before breakfast.     traZODone 100 MG tablet  Commonly known as:  DESYREL  Take 50 mg by mouth at bedtime.         Signed: Karn CassisBOTERO,Nasim Habeeb M 10/22/2015, 8:28 AM

## 2015-10-22 NOTE — Consult Note (Signed)
Urology Consult   Physician requesting consult: Hilda Lias, MD   Reason for consult: Acute Urinary Retentions  History of Present Illness: Christine Burns is a 70 y.o. with a long psych history as well as pain disorders and previous or current medication abuse. The patient underwent a lumbar surgery in February and was seen in follow up where she reported that she had fallen and so she was sent to the ED and admitted. The patient had been overusing her narcotic pain medication. The patient was doing well until the day of discharge when she had trouble voiding. The patient had a bladder scan for >600 and then I&O for 800 ml of clear urine. The patient reports that this has never happened before. She reports that she has been having some trouble voiding since yesterday. The patient denies lower extremity weakness since surgery or trouble voiding. The patient denies prior history of diabetes or MS.   Past Medical History  Diagnosis Date  . Hypertension   . Arthritis     back & knees & fingers   . Chronic kidney disease     due to increased use of NSAIDS in the past, appt. with [facility] x1, but now is managed by PCP- Nyland    . Hypothyroidism   . Anxiety   . Depression   . Fibromyalgia     tremors    Past Surgical History  Procedure Laterality Date  . Back surgery      cerv. fusion, low back surgery for stenosis   . Abdominal hysterectomy    . Appendectomy    . Breast reduction surgery Bilateral 1990's     Current Hospital Medications:  Home meds:    Medication List    STOP taking these medications        cyclobenzaprine 10 MG tablet  Commonly known as:  FLEXERIL     diazepam 5 MG tablet  Commonly known as:  VALIUM     Oxycodone HCl 10 MG Tabs      TAKE these medications        amphetamine-dextroamphetamine 30 MG 24 hr capsule  Commonly known as:  ADDERALL XR  Take 30 mg by mouth daily before breakfast.     DULoxetine 60 MG capsule  Commonly known as:  CYMBALTA   Take 60 mg by mouth 2 (two) times daily.     levothyroxine 100 MCG tablet  Commonly known as:  SYNTHROID, LEVOTHROID  Take 100 mcg by mouth daily.     loratadine 10 MG tablet  Commonly known as:  CLARITIN  Take 10 mg by mouth daily as needed for allergies.     LYRICA 100 MG capsule  Generic drug:  pregabalin  Take 100 mg by mouth 2 (two) times daily.     methadone 10 MG tablet  Commonly known as:  DOLOPHINE  Take 20-30 mg by mouth 2 (two) times daily.  in the morning,  in the evening     mometasone 50 MCG/ACT nasal spray  Commonly known as:  NASONEX  uses 1 spray in each nostril at bedtime     oxyCODONE-acetaminophen 5-325 MG tablet  Commonly known as:  PERCOCET/ROXICET  Take 1-2 tablets by mouth every 4 (four) hours as needed for moderate pain.     propranolol ER 120 MG 24 hr capsule  Commonly known as:  INDERAL LA  Take 120 mg by mouth daily before breakfast.     traZODone 100 MG tablet  Commonly known as:  DESYREL  Take 50 mg by mouth at bedtime.        Scheduled Meds: . amphetamine-dextroamphetamine  30 mg Oral QAC breakfast  . baclofen  20 mg Oral TID  . DULoxetine  60 mg Oral BID  . levothyroxine  100 mcg Oral QAC breakfast  . pregabalin  100 mg Oral BID  . propranolol ER  120 mg Oral QAC breakfast  . senna  1 tablet Oral BID  . sodium chloride flush  3 mL Intravenous Q12H  . traZODone  50 mg Oral QHS   Continuous Infusions: . sodium chloride    . 0.9 % NaCl with KCl 20 mEq / L 75 mL/hr at 10/19/15 2224   PRN Meds:.acetaminophen **OR** acetaminophen, loperamide, loratadine, menthol-cetylpyridinium **OR** phenol, naLOXone (NARCAN)  injection, ondansetron (ZOFRAN) IV, oxyCODONE, sodium chloride flush  Allergies:  Allergies  Allergen Reactions  . Fentanyl Hypertension  . Ibuprofen Other (See Comments)    Kidney damage  . Diazepam Other (See Comments)      SLEEP WALKING, hallucinations  . Zolpidem Tartrate Nausea And Vomiting  . Meperidine  Nausea Only  . Morphine Rash    No family history on file.  Social History:  reports that she has never smoked. She does not have any smokeless tobacco history on file. She reports that she does not drink alcohol or use illicit drugs.  ROS: A complete review of systems was performed.  All systems are negative except for pertinent findings as noted.  Physical Exam:  Vital signs in last 24 hours: Temp:  [98 F (36.7 C)-99 F (37.2 C)] 98.4 F (36.9 C) (03/30 1520) Pulse Rate:  [57-99] 73 (03/30 1520) Resp:  [16-18] 16 (03/30 1520) BP: (122-173)/(53-84) 145/75 mmHg (03/30 1520) SpO2:  [96 %-100 %] 100 % (03/30 1520) Constitutional:  Alert and oriented, No acute distress Cardiovascular: Regular rate and rhythm, No JVD Respiratory: Normal respiratory effort, Lungs clear bilaterally GI: Abdomen is soft, nontender, nondistended, no abdominal masses GU: No CVA tenderness Lymphatic: No lymphadenopathy Neurologic: Grossly intact, no focal deficits Psychiatric: Normal mood and affect  Laboratory Data:  No results for input(s): WBC, HGB, HCT, PLT in the last 72 hours.  No results for input(s): NA, K, CL, GLUCOSE, BUN, CALCIUM, CREATININE in the last 72 hours.  Invalid input(s): CO3   No results found for this or any previous visit (from the past 24 hour(s)). Recent Results (from the past 240 hour(s))  MRSA PCR Screening     Status: None   Collection Time: 10/17/15 11:46 PM  Result Value Ref Range Status   MRSA by PCR NEGATIVE NEGATIVE Final    Comment:        The GeneXpert MRSA Assay (FDA approved for NASAL specimens only), is one component of a comprehensive MRSA colonization surveillance program. It is not intended to diagnose MRSA infection nor to guide or monitor treatment for MRSA infections.     Renal Function:  Recent Labs  10/17/15 2220  CREATININE 1.39*   Estimated Creatinine Clearance: 35.3 mL/min (by C-G formula based on Cr of 1.39).  Radiologic  Imaging: Ct Head Wo Contrast  10/22/2015  CLINICAL DATA:  Dysarthria and left upper extremity weakness EXAM: CT HEAD WITHOUT CONTRAST TECHNIQUE: Contiguous axial images were obtained from the base of the skull through the vertex without intravenous contrast. COMPARISON:  October 18, 2015 FINDINGS: The ventricles are normal in size and configuration. There is no intracranial mass, hemorrhage, extra-axial fluid collection, or midline shift. There is mild small vessel disease  in the centra semiovale bilaterally. Elsewhere gray-white compartments appear normal. No acute infarct is evident. The bony calvarium appears intact. The mastoid air cells are clear. No intraorbital lesions are evident. There is nasal turbinate edema bilaterally with nares obstruction bilaterally. IMPRESSION: Mild periventricular small vessel disease. No acute infarct evident. No hemorrhage or mass effect. Nasal turbinate edema bilaterally with nares obstruction due to this edema bilaterally. Electronically Signed   By: Bretta Bang III M.D.   On: 10/22/2015 12:21    I independently reviewed the above imaging studies.  Impression/Recommendation: 70 y.o. female with acute urinary retention, likely related to recent overuse of narcotic medication. With 800 ml on I&O cath the patient likely has a bladder stretch injury. - Place and leave foley in place - Okay to discharge with foley in place - f/u with urology in 1 week for foley removal  Juliane Poot 10/22/2015, 4:10 PM   Patient was seen, examined,treatment plan was discussed with the resident.  I have directly reviewed the clinical findings, lab, imaging studies and management of this patient in detail. I have made the necessary changes and/or additions to the above noted documentation, and agree with the documentation, as recorded by the resident.

## 2015-10-22 NOTE — Progress Notes (Signed)
MD notified of patient's condition. Per MD, patient was at baseline during his discharge at [phone]. Stat CT ordered, radiology/ct aware. Per MD, no neurology consult for now, possibly psych consult, pending ct results.

## 2015-10-23 LAB — SEDIMENTATION RATE: SED RATE: 70 mm/h — AB (ref 0–22)

## 2015-10-23 LAB — C-REACTIVE PROTEIN: CRP: 12.6 mg/dL — AB (ref <1.0)

## 2015-10-23 MED ORDER — PREGABALIN 25 MG PO CAPS
50.0000 mg | ORAL_CAPSULE | Freq: Two times a day (BID) | ORAL | Status: DC
  Administered 2015-10-23 – 2015-10-24 (×2): 50 mg via ORAL
  Filled 2015-10-23 (×2): qty 2

## 2015-10-23 MED ORDER — BACLOFEN 10 MG PO TABS
5.0000 mg | ORAL_TABLET | Freq: Three times a day (TID) | ORAL | Status: DC
  Administered 2015-10-23 – 2015-10-24 (×4): 5 mg via ORAL
  Filled 2015-10-23 (×4): qty 1

## 2015-10-23 MED ORDER — DULOXETINE HCL 30 MG PO CPEP
30.0000 mg | ORAL_CAPSULE | Freq: Two times a day (BID) | ORAL | Status: DC
  Administered 2015-10-23 – 2015-10-24 (×2): 30 mg via ORAL
  Filled 2015-10-23 (×2): qty 1

## 2015-10-23 NOTE — Progress Notes (Signed)
Physical Therapy Treatment Patient Details Name: Christine MedicusRita K Burns MRN: [id number] DOB: 10/06/1945 Today's Date: 10/23/2015    History of Present Illness Adm 10/17/15 after fall at home with incr lumbar spasms (pt s/p L2-5 PLIF 09/15/15) 3/26 pt with decr responsiveness, given narcan and then became agitated and required ativan. Pt became somnolent and has been difficult to arouse. 3/29 pt alert, talking, following [id number], arthritis, fibromyalgia    PT Comments    Pt attempting to get EOB with NT on arrival for transfer to recliner for lunch.  Pt requiring more assist today then previous visit.  Pt reports increased L arm weakness last night continuing today and having difficulty holding drink with both hands - poor fine and gross motor with UEs.  No family present today and recommend 24/7 assist upon d/c.  If family unable to provide, pt would benefit from SNF.   Follow Up Recommendations  Supervision/Assistance - 24 hour;Home health PT (if family cannot provide assist, would benefit from SNF)     Equipment Recommendations  None recommended by PT    Recommendations for Other Services       Precautions / Restrictions Precautions Precautions: Back;Fall Precaution Comments: pt able to state precautions, multiple cues to adhere Required Braces or Orthoses:  (no brace per orders)    Mobility  Bed Mobility Overal bed mobility: Needs Assistance       Supine to sit: Mod assist;+2 for physical assistance     General bed mobility comments: pt halfway to EOB on arrival with NT - NT about to call for more assist, pt requiring assist for trunk upright and scooting to EOB utilizing bed pad, discussed using log roll technique for back to bed  Transfers Overall transfer level: Needs assistance Equipment used: None Transfers: Sit to/from Stand Sit to Stand: Mod assist Stand pivot transfers: Min assist       General transfer comment: pt requiring assist to rise and steady, less  assist for turning, cues for controlling descent, tends to keep posterior bias throughout transfer, knee blocked for transfer however no buckling observed  Ambulation/Gait Ambulation/Gait assistance:  (deferred as pt was getting to recliner for lunch - stating she was very hungry and thirsty)               Stairs            Wheelchair Mobility    Modified Rankin (Stroke Patients Only)       Balance Overall balance assessment: Needs assistance Sitting-balance support: Bilateral upper extremity supported;Feet supported Sitting balance-Leahy Scale: Poor Sitting balance - Comments: requiring external trunk support for sitting EOB due to posterior lean Postural control: Posterior lean Standing balance support: No upper extremity supported Standing balance-Leahy Scale: Poor Standing balance comment: pt did not use UE support on therapist's arms for transfer and required external steadying assist                    Cognition Arousal/Alertness: Awake/alert Behavior During Therapy: Impulsive Overall Cognitive Status: Impaired/Different from baseline Area of Impairment: Safety/judgement;Following commands;Problem solving     Memory: Decreased short-term memory;Decreased recall of precautions Following Commands: Follows one step commands consistently     Problem Solving: Requires verbal cues;Requires tactile cues General Comments: more awake and alert currently then this morning per RN, pt wondering what happened last night    Exercises      General Comments        Pertinent Vitals/Pain Pain Assessment: Faces Faces Pain Scale: Hurts little  more Pain Location: lumbar area Pain Descriptors / Indicators: Discomfort Pain Intervention(s): Monitored during session;Repositioned    Home Living                      Prior Function            PT Goals (current goals can now be found in the care plan section) Progress towards PT goals: Progressing  toward goals    Frequency  Min 3X/week    PT Plan Current plan remains appropriate    Co-evaluation             End of Session Equipment Utilized During Treatment: Gait belt Activity Tolerance: Patient tolerated treatment well Patient left: in chair;with call bell/phone within reach;with chair alarm set     Time: 1325-1339 PT Time Calculation (min) (ACUTE ONLY): 14 min  Charges:  $Therapeutic Activity: 8-22 mins                    G Codes:      Wilian Kwong,KATHrine E Oct 28, 2015, 2:09 PM Zenovia Jarred, PT, DPT Oct 28, 2015 Pager: [phone]

## 2015-10-23 NOTE — Consult Note (Signed)
Requesting Physician: Dr. Jeral Fruitbotero    Chief Complaint: generalized weakness.     HPI:                                                                                                                                         Christine Burns is an 70 y.o. female with sever arthritis in the knees and fingers. She has been feeling weak in her hands mostly but also some UE weakness. She tells me her fingers are in sever pain from her arthritis and she does not want to move them. She has no weakness in her LE per patient. Neurology was consulted due to weakness and the question of possible stroke. At this time only CT of head has been obtained.   Date last known well: Unable to determine Time last known well: Unable to determine tPA Given: No: no LSN    Past Medical History  Diagnosis Date  . Hypertension   . Arthritis     back & knees & fingers   . Chronic kidney disease     due to increased use of NSAIDS in the past, appt. with [facility] x1, but now is managed by PCP- Nyland    . Hypothyroidism   . Anxiety   . Depression   . Fibromyalgia     tremors    Past Surgical History  Procedure Laterality Date  . Back surgery      cerv. fusion, low back surgery for stenosis   . Abdominal hysterectomy    . Appendectomy    . Breast reduction surgery Bilateral 1990's    No family history on file. Social History:  reports that she has never smoked. She does not have any smokeless tobacco history on file. She reports that she does not drink alcohol or use illicit drugs.  Allergies:  Allergies  Allergen Reactions  . Fentanyl Hypertension  . Ibuprofen Other (See Comments)    Kidney damage  . Diazepam Other (See Comments)      SLEEP WALKING, hallucinations  . Zolpidem Tartrate Nausea And Vomiting  . Meperidine Nausea Only  . Morphine Rash    Medications:                                                                                                                           Prior to Admission:   Prescriptions prior  to admission  Medication Sig Dispense Refill Last Dose  . amphetamine-dextroamphetamine (ADDERALL XR) 30 MG 24 hr capsule Take 30 mg by mouth daily before breakfast.    10/17/2015 at Unknown time  . cyclobenzaprine (FLEXERIL) 10 MG tablet Take 1 tablet (10 mg total) by mouth 3 (three) times daily. (Patient taking differently: Take 10 mg by mouth 2 (two) times daily. ) 90 tablet 0 10/17/2015 at Unknown time  . DULoxetine (CYMBALTA) 60 MG capsule Take 60 mg by mouth 2 (two) times daily.    10/17/2015 at am  . levothyroxine (SYNTHROID, LEVOTHROID) 100 MCG tablet Take 100 mcg by mouth daily.   10/17/2015 at Unknown time  . loratadine (CLARITIN) 10 MG tablet Take 10 mg by mouth daily as needed for allergies.    Past Month at Unknown time  . LYRICA 100 MG capsule Take 100 mg by mouth 2 (two) times daily.   10/17/2015 at am  . methadone (DOLOPHINE) 10 MG tablet Take 20-30 mg by mouth 2 (two) times daily.  in the morning,  in the evening   10/17/2015 at am  . mometasone (NASONEX) 50 MCG/ACT nasal spray uses 1 spray in each nostril at bedtime   10/16/2015 at Unknown time  . Oxycodone HCl 10 MG TABS Take 10 mg by mouth 2 (two) times daily.   10/17/2015 at Unknown time  . propranolol ER (INDERAL LA) 120 MG 24 hr capsule Take 120 mg by mouth daily before breakfast.    10/17/2015 at 0500  . traZODone (DESYREL) 100 MG tablet Take 50 mg by mouth at bedtime.    10/16/2015 at Unknown time  . diazepam (VALIUM) 5 MG tablet Take 1 tablet (5 mg total) by mouth every 6 (six) hours as needed for muscle spasms. 30 tablet 0   . oxyCODONE-acetaminophen (PERCOCET/ROXICET) 5-325 MG tablet Take 1-2 tablets by mouth every 4 (four) hours as needed for moderate pain. 90 tablet 0    Scheduled: . amphetamine-dextroamphetamine  30 mg Oral QAC breakfast  . baclofen  5 mg Oral TID  . DULoxetine  60 mg Oral BID  . levothyroxine  100 mcg Oral QAC breakfast  . pregabalin  100 mg Oral BID  . propranolol ER  120 mg  Oral QAC breakfast  . senna  1 tablet Oral BID  . sodium chloride flush  3 mL Intravenous Q12H  . traZODone  50 mg Oral QHS    ROS:                                                                                                                                       History obtained from the patient  General ROS: negative for - chills, fatigue, fever, night sweats, weight gain or weight loss Psychological ROS: negative for - behavioral disorder, hallucinations, memory difficulties, mood swings or suicidal ideation Ophthalmic ROS: negative for - blurry vision, double vision, eye pain or loss  of vision ENT ROS: negative for - epistaxis, nasal discharge, oral lesions, sore throat, tinnitus or vertigo Allergy and Immunology ROS: negative for - hives or itchy/watery eyes Hematological and Lymphatic ROS: negative for - bleeding problems, bruising or swollen lymph nodes Endocrine ROS: negative for - galactorrhea, hair pattern changes, polydipsia/polyuria or temperature intolerance Respiratory ROS: negative for - cough, hemoptysis, shortness of breath or wheezing Cardiovascular ROS: negative for - chest pain, dyspnea on exertion, edema or irregular heartbeat Gastrointestinal ROS: negative for - abdominal pain, diarrhea, hematemesis, nausea/vomiting or stool incontinence Genito-Urinary ROS: negative for - dysuria, hematuria, incontinence or urinary frequency/urgency Musculoskeletal ROS: negative for - joint swelling or muscular weakness Neurological ROS: as noted in HPI Dermatological ROS: negative for rash and skin lesion changes  Neurologic Examination:                                                                                                      Blood pressure 160/87, pulse 88, temperature 98.5 F (36.9 C), temperature source Oral, resp. rate 18, height 5\' 3"  (1.6 m), weight 68.04 kg (150 lb), SpO2 91 %.  HEENT-  Normocephalic, no lesions, without obvious abnormality.  Normal external eye  and conjunctiva.  Normal TM's bilaterally.  Normal auditory canals and external ears. Normal external nose, mucus membranes and septum.  Normal pharynx. Cardiovascular- S1, S2 normal, pulses palpable throughout   Lungs- chest clear, no wheezing, rales, normal symmetric air entry Abdomen- normal findings: bowel sounds normal Extremities- no edema Lymph-no adenopathy palpable Musculoskeletal-no joint tenderness, deformity or swelling Skin-warm and dry, no hyperpigmentation, vitiligo, or suspicious lesions  Neurological Examination Mental Status: Alert, oriented, thought content appropriate.  Speech fluent without evidence of aphasia.  Able to follow 3 step commands without difficulty. Cranial Nerves: II: Visual fields grossly normal, pupils equal, round, reactive to light and accommodation III,IV, VI: ptosis not present, extra-ocular motions intact bilaterally V,VII: smile symmetric, facial light touch sensation normal bilaterally VIII: hearing normal bilaterally IX,X: uvula rises symmetrically XI: bilateral shoulder shrug XII: midline tongue extension Motor: Right : Upper extremity   5/5    Left:     Upper extremity   5/5  Lower extremity   5/5     Lower extremity   5/5 --significant boggy DIP, PIP and MCP. No muscle wasting. She has good strength bilaterally when able to try through the pain.  He weakness is pain dependent. She will not even allow me to passively move her MCP joint of thumbs.   Tone and bulk:normal tone throughout; no atrophy noted Sensory: Pinprick and light touch intact throughout, bilaterally Deep Tendon Reflexes: 1+ and symmetric throughout Plantars: Right: downgoing   Left: downgoing Cerebellar: normal finger-to-nose, and normal heel-to-shin test Gait: not tested       Lab Results: Basic Metabolic Panel:  Recent Labs Lab 10/17/15 2220  NA 135  K 4.3  CL 103  CO2 25  GLUCOSE 104*  BUN 24*  CREATININE 1.39*  CALCIUM 8.6*    Liver Function  Tests: No results for input(s): AST, ALT, ALKPHOS, BILITOT, PROT, ALBUMIN in the  last 168 hours. No results for input(s): LIPASE, AMYLASE in the last 168 hours. No results for input(s): AMMONIA in the last 168 hours.  CBC:  Recent Labs Lab 10/17/15 2220  WBC 8.5  NEUTROABS 6.4  HGB 7.6*  HCT 23.8*  MCV 85.3  PLT 264    Cardiac Enzymes: No results for input(s): CKTOTAL, CKMB, CKMBINDEX, TROPONINI in the last 168 hours.  Lipid Panel: No results for input(s): CHOL, TRIG, HDL, CHOLHDL, VLDL, LDLCALC in the last 168 hours.  CBG: No results for input(s): GLUCAP in the last 168 hours.  Microbiology: Results for orders placed or performed during the hospital encounter of 10/17/15  MRSA PCR Screening     Status: None   Collection Time: 10/17/15 11:46 PM  Result Value Ref Range Status   MRSA by PCR NEGATIVE NEGATIVE Final    Comment:        The GeneXpert MRSA Assay (FDA approved for NASAL specimens only), is one component of a comprehensive MRSA colonization surveillance program. It is not intended to diagnose MRSA infection nor to guide or monitor treatment for MRSA infections.     Coagulation Studies: No results for input(s): LABPROT, INR in the last 72 hours.  Imaging: Ct Head Wo Contrast  10/22/2015  CLINICAL DATA:  Dysarthria and left upper extremity weakness EXAM: CT HEAD WITHOUT CONTRAST TECHNIQUE: Contiguous axial images were obtained from the base of the skull through the vertex without intravenous contrast. COMPARISON:  October 18, 2015 FINDINGS: The ventricles are normal in size and configuration. There is no intracranial mass, hemorrhage, extra-axial fluid collection, or midline shift. There is mild small vessel disease in the centra semiovale bilaterally. Elsewhere gray-white compartments appear normal. No acute infarct is evident. The bony calvarium appears intact. The mastoid air cells are clear. No intraorbital lesions are evident. There is nasal turbinate edema  bilaterally with nares obstruction bilaterally. IMPRESSION: Mild periventricular small vessel disease. No acute infarct evident. No hemorrhage or mass effect. Nasal turbinate edema bilaterally with nares obstruction due to this edema bilaterally. Electronically Signed   By: Bretta Bang III M.D.   On: 10/22/2015 12:21       Assessment and plan discussed with with attending physician and they are in agreement.    Felicie Morn PA-C [facility] [phone]  10/23/2015, 1:44 PM   Assessment: 70 y.o. female with increasing weakness of her bilateral hands and arms secondary to pain and discomfort. She has fibromyalgia but also has notable arthritis likely osteoarthritis. Less likely CVA but will obtain MRI brain and Cervical spine to rule out cervical stenosis or CVA. Please see Dr. Lavon Paganini attestation note for A/P for any additional work up recommendations.   Stroke Risk Factors - hyperlipidemia and hypertension

## 2015-10-23 NOTE — Progress Notes (Signed)
Patient ID: [name], female   DOB: 04/16/1946, 70 y.o.   MRN: [id number] Saw the patient after i was called because the concern about cva.  i saw her this am and i did not see any evidence of stroke. Came back to check on her she is awake moves all 4 extremities face is  symetrical The only finding which is old is some stiffness of the hands secondary to arthritis. Since there is a question in the chart about the possibility of stroke, a neurology consult was called. Legally i can not discharge her till the situation is taken care off.

## 2015-10-23 NOTE — Progress Notes (Signed)
RN attempted to get patient out of bed and in chair. Patient very lethargic, falling to sleep when not spoken to. Patient refusing to help with getting to edge of bed, states "my arms are too weak, this medication is making me too sleepy." Patient unable to assist, unable to get patient to chair safely. Patient's speech very repetitive, repeats "this medication is making me too sleepy" and "is this medication making me sleepy?" After patient was bathed, she was more alert and interactive but still falls asleep easily. Both upper extremities very weak, patient states her left arm is painful, states it is due to arthritis in hand. Hand raised on pillow, warm compress applied. RN to notify md of status

## 2015-10-23 NOTE — Progress Notes (Addendum)
On assessment in AM, patient was very difficult to arouse, needing repeated sternal rub to get patient awake and keep patient awake. Patient was oriented x2, disoriented to time and situation. RN attempted to get patient up to chair per AM note, but patient leaned too far forward and back, unable to keep self up, required 2 assist to get to edge of bed. She was then returned to bed. RN asked patient to grip hands, patient stated she could not, with repeated directions, patient gripped hands very weakly, dorsi flexion and plantar flexion were both very weak and also required repeated directions from RN to have patient complete task. At time, patient would not answer if hands were weak due to pain, strength, etc.This matched assessment performed yesterday. Patient was then bathed, which she became more alert but still drowsy and fell back to sleep when stimulus stopped. MD was notified of weakness in bilateral arms. Neurology was called to see patient.   At time of this note, patient is now alert, oriented x3, back in bed and moving all extremities.

## 2015-10-23 NOTE — Care Management Important Message (Signed)
Important Message  Patient Details  Name: Christine MedicusRita K Harada MRN: [id number] Date of Birth: 12/08/1945   Medicare Important Message Given:  Yes    Tanaia Hawkey P Liberato Stansbery 10/23/2015, 12:18 PM

## 2015-10-23 NOTE — Clinical Social Work Note (Signed)
Patient is not medically stable for d/c. Facility notified.   Derenda FennelBashira Byanka Landrus, MSW, LCSWA [phone] 10/23/2015 1:54 PM

## 2015-10-23 NOTE — Progress Notes (Signed)
Patient ID: Gayla MedicusRita K Stahnke, female   DOB: 06/30/1946, 70 y.o.   MRN: [id number] Seen by GU  Neurology consult pending. Sleepy, no weakness.see orders

## 2015-10-23 NOTE — Progress Notes (Addendum)
Occupational Therapy Treatment Patient Details Name: Christine Burns MRN: [id number] DOB: 10/28/1945 Today's Date: 10/23/2015    History of present illness Adm 10/17/15 after fall at home with incr lumbar spasms (pt s/p L2-5 PLIF 09/15/15) 3/26 pt with decr responsiveness, given narcan and then became agitated and required ativan. Pt became somnolent and has been difficult to arouse. 3/29 pt alert, talking, following [id number], arthritis, fibromyalgia   OT comments  Pt more alert this PM but noted to have decreased grip strength and fine motor coordination bilaterally affecting her independence and safety with ADLs and functional mobility. Pt requesting to get back to bed and declining further functional mobility and ADLs at this time. Pt currently overall mod assist for sit to stand and stand pivot transfer back to bed. D/c plan remains appropriate. Will continue to follow acutely.   Follow Up Recommendations  SNF;Supervision/Assistance - 24 hour    Equipment Recommendations  None recommended by OT    Recommendations for Other Services      Precautions / Restrictions Precautions Precautions: Back;Fall Precaution Comments: pt able to state precautions, multiple cues to adhere Required Braces or Orthoses:  (no brace needed order) Restrictions Weight Bearing Restrictions: No       Mobility Bed Mobility Overal bed mobility: Needs Assistance Bed Mobility: Sit to Supine     Supine to sit: Mod assist;+2 for physical assistance Sit to supine: Mod assist   General bed mobility comments: Assist for LEs into bed and controlled descent of trunk to supine. Pt able to scoot self up in bed.  Transfers Overall transfer level: Needs assistance Equipment used: Rolling walker (2 wheeled) Transfers: Sit to/from [employer] CorporationStand;Stand Pivot Transfers Sit to Stand: Mod assist Stand pivot transfers: Mod assist       General transfer comment: Mod assist to boost up from chair and assist for stand  pivot to EOB. Pt very unsteady on feet.    Balance Overall balance assessment: Needs assistance Sitting-balance support: Feet supported;No upper extremity supported Sitting balance-Leahy Scale: Poor Sitting balance - Comments: requiring external trunk support for sitting EOB due to posterior lean Postural control: Posterior lean Standing balance support: Bilateral upper extremity supported Standing balance-Leahy Scale: Poor Standing balance comment: Attempted use of RW; pt with difficulty gripping and required external support for balance in standing.                   ADL Overall ADL's : Needs assistance/impaired                                     Functional mobility during ADLs: Moderate assistance;Rolling walker (stand pivot) General ADL Comments: Pt requesting to get back to bed; declined further functional mobility or ADL activity. Pt with strong posterior lean in standing and difficulty gripping RW bilaterally. Pt noted to have decreased grip strength and fine motor coordination. Educated on continued use of bil UEs during functional activities.      Vision                     Perception     Praxis      Cognition   Behavior During Therapy: Impulsive Overall Cognitive Status: Impaired/Different from baseline Area of Impairment: Safety/judgement;Following commands;Problem solving     Memory: Decreased short-term memory;Decreased recall of precautions  Following Commands: Follows one step commands consistently Safety/Judgement: Decreased awareness of safety;Decreased awareness of deficits   Problem  Solving: Requires verbal cues;Requires tactile cues General Comments: more awake and alert currently then this morning per RN, pt wondering what happened last night    Extremity/Trunk Assessment               Exercises     Shoulder Instructions       General Comments      Pertinent Vitals/ Pain       Pain Assessment:  Faces Faces Pain Scale: Hurts little more Pain Location: back Pain Descriptors / Indicators: Sore Pain Intervention(s): Limited activity within patient's tolerance;Monitored during session;Repositioned  Home Living                                          Prior Functioning/Environment              Frequency Min 2X/week     Progress Toward Goals  OT Goals(current goals can now be found in the care plan section)  Progress towards OT goals: Not progressing toward goals - comment (Limited by fatigue)  Acute Rehab OT Goals Patient Stated Goal: get better OT Goal Formulation: With patient ADL Goals Pt/caregiver will Perform Home Exercise Program: Both right and left upper extremity;With theraputty;Increased strength;Independently;With written HEP provided (increase fine motor coordination)  Plan Discharge plan remains appropriate    Co-evaluation                 End of Session Equipment Utilized During Treatment: Gait belt;Rolling walker   Activity Tolerance Patient limited by fatigue;Patient limited by pain   Patient Left in bed;with call bell/phone within reach;with bed alarm set   Nurse Communication Other (comment) (Pt in bed, decreased grip and fine motor coodination)        Time: 1610-9604 OT Time Calculation (min): 13 min  Charges: OT General Charges $OT Visit: 1 Procedure OT Treatments $Therapeutic Activity: 8-22 mins  Gaye Alken M.S., OTR/L Pager: [phone]  10/23/2015, 4:09 PM

## 2015-10-23 NOTE — Progress Notes (Signed)
Pt continues to yell out "I'm numb and I can't move".  RN assessed pt who could move all extremities without difficulty.  Pt then stated "I'm dry, I need IV fluids right now!"  RN encouraged pt to drink water out of her cup.  Pt stated "I can't hold my cup, I'm numb".  Pt then was able to bring the cup of water to her mouth to drink it.  Pt stated to RN "I need you to feed me, I can't feed myself".  RN encouraged her that she could feed herself.  Pt continues to try and get out of bed. Pt educated and redirected to stay in bed. Will continue to closely monitor.   Estanislado EmmsAshley Schwarz, RN

## 2015-10-23 NOTE — Care Management Note (Signed)
Case Management Note  Patient Details  Name: Christine Burns MRN: [id number] Date of Birth: 02/11/1946  Subjective/Objective:                    Action/Plan: Plan [facility] when medically [facility]. CM continuing to follow for discharge needs.   Expected Discharge Date:  10/25/15               Expected Discharge Plan:     In-House Referral:     Discharge planning Services     Post Acute Care Choice:    Choice offered to:     DME Arranged:    DME Agency:     HH Arranged:    HH Agency:     Status of Service:  In process, will continue to follow  Medicare Important Message Given:  Yes Date Medicare IM Given:    Medicare IM give by:    Date Additional Medicare IM Given:    Additional Medicare Important Message give by:     If discussed at Long Length of Stay Meetings, dates discussed:    Additional Comments:  Christine BaloKelli F Barbara Ahart, RN 10/23/2015, 3:58 PM

## 2015-10-24 ENCOUNTER — Inpatient Hospital Stay (HOSPITAL_COMMUNITY): Payer: Medicare HMO

## 2015-10-24 DIAGNOSIS — G9340 Encephalopathy, unspecified: Secondary | ICD-10-CM

## 2015-10-24 DIAGNOSIS — R279 Unspecified lack of coordination: Secondary | ICD-10-CM

## 2015-10-24 MED ORDER — PREGABALIN 25 MG PO CAPS: 25.0000 mg | ORAL_CAPSULE | Freq: Two times a day (BID) | ORAL | Status: DC

## 2015-10-24 MED ORDER — DULOXETINE HCL 30 MG PO CPEP: 30.0000 mg | ORAL_CAPSULE | Freq: Two times a day (BID) | ORAL | Status: DC

## 2015-10-24 MED ORDER — PREGABALIN 25 MG PO CAPS: 25.0000 mg | ORAL_CAPSULE | Freq: Three times a day (TID) | ORAL | Status: DC

## 2015-10-24 MED ORDER — PREGABALIN 50 MG PO CAPS: 50.0000 mg | ORAL_CAPSULE | Freq: Two times a day (BID) | ORAL | Status: DC

## 2015-10-24 MED ORDER — PREGABALIN 25 MG PO CAPS
25.0000 mg | ORAL_CAPSULE | Freq: Three times a day (TID) | ORAL | Status: DC
  Administered 2015-10-24: 25 mg via ORAL
  Filled 2015-10-24: qty 1

## 2015-10-24 MED ORDER — BACLOFEN 10 MG PO TABS: 5.0000 mg | ORAL_TABLET | Freq: Three times a day (TID) | ORAL | Status: DC

## 2015-10-24 NOTE — Clinical Social Work Note (Signed)
Clinical Social Worker facilitated patient discharge including contacting patient and facility to confirm patient discharge plans.  Clinical information faxed to facility and left message with patient niece regarding discharge plans.  CSW arranged ambulance transport via [facility].  RN to call report prior to discharge.  Clinical Social Worker will sign off for now as social work intervention is no longer needed. Please consult us again if new need arises.  Macario GoldsJesse Curtis Cain, Kentucky[place]CS[place] [phone]

## 2015-10-24 NOTE — Progress Notes (Signed)
Interval History:                                                                                                                      Christine Burns is an 70 y.o. female patient with  mild encephalopathy secondary to polypharmacy of neurological pain medications, status post lumbar fusion surgery. She was noted yesterday to have asterixis as described in my consultation note. I decreased her Lyrica dose yesterday from 100 twice a day to 50 twice a day. Also decreased Cymbalta dose in half. She is looking better this morning, more alert. She is planned to be discharged home by the primary neurosurgery team. 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 Opioids have been discontinued more than a week ago due to encephalopathy. MRI of the brain did not show any acute pathology. MRI of the cervical spine that showed chronic degenerative changes, evidence of prior fusion surgery with moderate multilevel spinal canal stenosis, without evidence of spinal cord compression.   No new neurological symptoms.   Past Medical History: Past Medical History  Diagnosis Date  . Hypertension   . Arthritis     back & knees & fingers   . Chronic kidney disease     due to increased use of NSAIDS in the past, appt. with [facility] x1, but now is managed by PCP- N[facility]    . Hypothyroidism   . Anxiety   . Depression   . Fibromyalgia     tremors    Past Surgical History  Procedure Laterality Date  . Back surgery      cerv. fusion, low back surgery for stenosis   . Abdominal hysterectomy    . Appendectomy    . Breast reduction surgery Bilateral 1990's    Family History: No family history on file.  Social History:   reports that she has never smoked. She does not have any smokeless tobacco history on file. She reports that she does not drink alcohol or use illicit drugs.  Allergies:  Allergies  Allergen Reactions  . Fentanyl Hypertension  . Ibuprofen Other (See Comments)    Kidney damage  . Diazepam Other  (See Comments)      SLEEP WALKING, hallucinations  . Zolpidem Tartrate Nausea And Vomiting  . Meperidine Nausea Only  . Morphine Rash     Medications:  Current facility-administered medications:  .  0.9 %  sodium chloride infusion, 250 mL, Intravenous, Continuous, Hilda Lias, MD .  0.9 % NaCl with KCl 20 mEq/ L  infusion, , Intravenous, Continuous, Hilda Lias, MD, Last Rate: 75 mL/hr at 10/19/15 2224 .  acetaminophen (TYLENOL) tablet 650 mg, 650 mg, Oral, Q4H PRN, 650 mg at 10/23/15 1630 **OR** acetaminophen (TYLENOL) suppository 650 mg, 650 mg, Rectal, Q4H PRN, Hilda Lias, MD .  amphetamine-dextroamphetamine (ADDERALL XR) 24 hr capsule 30 mg, 30 mg, Oral, QAC breakfast, Hilda Lias, MD, 30 mg at 10/24/15 0819 .  baclofen (LIORESAL) tablet 5 mg, 5 mg, Oral, TID, Hilda Lias, MD, 5 mg at 10/24/15 1119 .  DULoxetine (CYMBALTA) DR capsule 30 mg, 30 mg, Oral, BID, Holden Maniscalco Daniel Nones, MD, 30 mg at 10/24/15 1119 .  levothyroxine (SYNTHROID, LEVOTHROID) tablet 100 mcg, 100 mcg, Oral, QAC breakfast, Hilda Lias, MD, 100 mcg at 10/24/15 0819 .  loperamide (IMODIUM) capsule 2 mg, 2 mg, Oral, PRN, Hilda Lias, MD .  loratadine (CLARITIN) tablet 10 mg, 10 mg, Oral, Daily PRN, Hilda Lias, MD .  menthol-cetylpyridinium (CEPACOL) lozenge 3 mg, 1 lozenge, Oral, PRN **OR** phenol (CHLORASEPTIC) mouth spray 1 spray, 1 spray, Mouth/Throat, PRN, Hilda Lias, MD .  naloxone [facility]) injection 0.4 mg, 0.4 mg, Intravenous, PRN, Hilda Lias, MD, 0.2 mg at 10/18/15 1240 .  ondansetron (ZOFRAN) injection 4 mg, 4 mg, Intravenous, Q4H PRN, Hilda Lias, MD .  pregabalin (LYRICA) capsule 25 mg, 25 mg, Oral, TID, Latrell Potempa Daniel Nones, MD .  propranolol ER (INDERAL LA) 24 hr capsule 120 mg, 120 mg, Oral, QAC breakfast, Hilda Lias, MD, 120 mg at  10/24/15 0819 .  senna (SENOKOT) tablet 8.6 mg, 1 tablet, Oral, BID, Hilda Lias, MD, 8.6 mg at 10/24/15 1119 .  sodium chloride flush (NS) 0.9 % injection 3 mL, 3 mL, Intravenous, Q12H, Hilda Lias, MD, 3 mL at 10/23/15 2142 .  sodium chloride flush (NS) 0.9 % injection 3 mL, 3 mL, Intravenous, PRN, Hilda Lias, MD   Neurologic Examination:                                                                                                     Today's Vitals   10/23/15 2151 10/24/15 0147 10/24/15 0538 10/24/15 0600  BP: 156/86 146/87 175/78   Pulse: 81 74 75   Temp: 98.9 F (37.2 C) 98.5 F (36.9 C) 98.9 F (37.2 C)   TempSrc: Oral Oral Oral   Resp: Height:      Weight:      SpO2: 96% 95% 90% 95%  PainSc:        Evaluation of higher integrative functions including: Level of alertness: Alert,  Oriented to time, place and person Speech: fluent, no evidence of dysarthria or aphasia noted.  Test the following cranial nerves: 2-12 grossly intact Motor examination: Normal tone, bulk, full 5/5 motor strength in all 4 extremities.  Test coordination: Normal finger nose testing, with no evidence of limb appendicular ataxia. She does have minimal asterixis especially with  the right hand, and minimal postural and end kinetic tremor in the fingers sister for mild baseline essential tremor.     Lab Results: Basic Metabolic Panel:  Recent Labs Lab 10/17/15 2220  NA 135  K 4.3  CL 103  CO2 25  GLUCOSE 104*  BUN 24*  CREATININE 1.39*  CALCIUM 8.6*    Liver Function Tests: No results for input(s): AST, ALT, ALKPHOS, BILITOT, PROT, ALBUMIN in the last 168 hours. No results for input(s): LIPASE, AMYLASE in the last 168 hours. No results for input(s): AMMONIA in the last 168 hours.  CBC:  Recent Labs Lab 10/17/15 2220  WBC 8.5  NEUTROABS 6.4  HGB 7.6*  HCT 23.8*  MCV 85.3  PLT 264    Cardiac Enzymes: No results for input(s): CKTOTAL, CKMB, CKMBINDEX,  TROPONINI in the last 168 hours.  Lipid Panel: No results for input(s): CHOL, TRIG, HDL, CHOLHDL, VLDL, LDLCALC in the last 168 hours.  CBG: No results for input(s): GLUCAP in the last 168 hours.  Microbiology: Results for orders placed or performed during the hospital encounter of 10/17/15  MRSA PCR Screening     Status: None   Collection Time: 10/17/15 11:46 PM  Result Value Ref Range Status   MRSA by PCR NEGATIVE NEGATIVE Final    Comment:        The GeneXpert MRSA Assay (FDA approved for NASAL specimens only), is one component of a comprehensive MRSA colonization surveillance program. It is not intended to diagnose MRSA infection nor to guide or monitor treatment for MRSA infections.     Imaging: Dg Lumbar Spine 2-3 Views  10/24/2015  CLINICAL DATA:  Acute low back/ lumbar spine pain following fall. EXAM: LUMBAR SPINE - 2-3 VIEW COMPARISON:  09/30/2015 and prior radiographs FINDINGS: Posterior rod and bipedicular screw fixation from L2-L5 again identified with interbody hardware at L3-4. There is no evidence of acute fracture or subluxation. No definite complicating features are noted. A moderate apex left lumbar scoliosis is again noted. IMPRESSION: Posterior fusion from L2-L5 without acute or definite complicating features. Electronically Signed   By: Harmon PierJeffrey  Hu M.D.   On: 10/24/2015 11:12   Ct Head Wo Contrast  10/22/2015  CLINICAL DATA:  Dysarthria and left upper extremity weakness EXAM: CT HEAD WITHOUT CONTRAST TECHNIQUE: Contiguous axial images were obtained from the base of the skull through the vertex without intravenous contrast. COMPARISON:  October 18, 2015 FINDINGS: The ventricles are normal in size and configuration. There is no intracranial mass, hemorrhage, extra-axial fluid collection, or midline shift. There is mild small vessel disease in the centra semiovale bilaterally. Elsewhere gray-white compartments appear normal. No acute infarct is evident. The bony  calvarium appears intact. The mastoid air cells are clear. No intraorbital lesions are evident. There is nasal turbinate edema bilaterally with nares obstruction bilaterally. IMPRESSION: Mild periventricular small vessel disease. No acute infarct evident. No hemorrhage or mass effect. Nasal turbinate edema bilaterally with nares obstruction due to this edema bilaterally. Electronically Signed   By: Bretta BangWilliam  Woodruff III M.D.   On: 10/22/2015 12:21   Mr Brain Wo Contrast  10/24/2015  CLINICAL DATA:  New onset left arm numbness. EXAM: MRI HEAD WITHOUT CONTRAST TECHNIQUE: Multiplanar, multiecho pulse sequences of the brain and surrounding structures were obtained without intravenous contrast. COMPARISON:  Head CT 10/22/2015 FINDINGS: Diffusion imaging does not show any acute or subacute infarction. There chronic small-vessel ischemic changes affecting the pons. No focal cerebellar insult. Cerebral hemispheres show moderate changes of chronic small  vessel disease affecting the white matter. No cortical or large vessel territory infarction. No mass lesion, hemorrhage, hydrocephalus or extra-axial collection. No pituitary mass. No inflammatory sinus disease. No skull or skullbase lesion. Major vessels at the base of the brain show flow. IMPRESSION: No acute finding. No specific cause of the presenting symptoms is identified. Chronic small-vessel ischemic changes affecting the pons in the cerebral hemispheric white matter. Electronically Signed   By: Paulina Fusi M.D.   On: 10/24/2015 11:57   Mr Cervical Spine Wo Contrast  10/24/2015  CLINICAL DATA:  Status post lumbar spine surgery with new onset of left arm weakness. EXAM: MRI CERVICAL SPINE WITHOUT CONTRAST TECHNIQUE: Multiplanar, multisequence MR imaging of the cervical spine was performed. No intravenous contrast was administered. COMPARISON:  Cervical spine radiographs 06/11/2014 FINDINGS: Examination is limited by patient motion. Anterior and interbody fusion  changes are noted from C3-C7. No obvious complicating features are demonstrated. The vertebral bodies demonstrate grossly normal marrow signal. The cervical spinal cord demonstrates normal signal intensity. No cord lesion or syrinx. C2-3: Diffuse bulging annulus and osteophytic ridging with mass effect on the ventral thecal sac and narrowing of the ventral CSF space. There is also mild right foraminal narrowing. C3-4: Anterior and interbody fusion changes. No obvious spinal or foraminal stenosis. C4-5: Anterior and interbody fusion changes. Moderate artifact. No obvious spinal or foraminal stenosis. C5-6: Anterior and interbody fusion changes. Degenerative anterior subluxation of C5 with a bulging uncovered disc. No significant spinal or foraminal stenosis. C6-7: Anterior and interbody fusion changes. No obvious spinal or foraminal stenosis. C7-T1:  No significant findings. T1-2: Shallow central disc protrusion with mild flattening of the ventral thecal sac. IMPRESSION: 1. Anterior and interbody fusion changes from C3-C7. No definite complicating features. 2. Shallow broad-based disc protrusion, bulging annulus and osteophytic ridging at C2-3 with flattening of the ventral thecal sac and mild right foraminal stenosis. 3. Shallow central disc protrusion at T1-2. 4. Grossly normal MR appearance of the cervical spinal cord. Electronically Signed   By: Rudie Meyer M.D.   On: 10/24/2015 10:57    Assessment and plan:   Christine Burns is an 70 y.o. female patient with improving encephalopathy after neurological medication doses were reduced last night. Lyrica dose was reduced to 50 twice a day from 100 twice a day. She continues to have mild asterixis, recommend further reducing the dose to 25 mg 3 times a day. She takes Lyrica mainly for pain control. Due to short half life of Lyrica, 3 times a day dosing will be beneficial for the patient for pain control but would keep the dose low to prevent worsening of  encephalopathy. Also advised her to minimize using pain medications in future to prevent recurrence of encephalopathy symptoms. She'll continue with baclofen and Cymbalta same doses. She was previously on methadone at home which has been discontinued during this admission. She didn't plan for discharge to rehabilitation facility today. Advise to monitor carefully for any withdrawal symptoms. Discussed results of MRI of the brain and C-spine with the patient, answered several of her questions.  No other new issues to address from neurological standpoint.  We'll sign off.

## 2015-10-24 NOTE — Progress Notes (Signed)
Report called [facility] RN[facility]; patient ready for transport to SNF; awaiting ambulance transport.

## 2015-10-24 NOTE — Progress Notes (Signed)
Subjective: Patient reports doing well. C/o back pain.  Objective: Vital signs in last 24 hours: Temp:  [98.4 F (36.9 C)-98.9 F (37.2 C)] 98.9 F (37.2 C) (04/01 0538) Pulse Rate:  [69-88] 75 (04/01 0538) Resp:  [16-18] 16 (04/01 0538) BP: (146-175)/(78-88) 175/78 mmHg (04/01 0538) SpO2:  [90 %-96 %] 95 % (04/01 0600)  Intake/Output from previous day: 03/31 0701 - 04/01 0700 In: 1200 [P.O.:1200] Out: 1075 [Urine:675; Drains:400] Intake/Output this shift:    Physical Exam: No apparent weakness suggestive of CVA.    Lab Results: No results for input(s): WBC, HGB, HCT, PLT in the last 72 hours. BMET No results for input(s): NA, K, CL, CO2, GLUCOSE, BUN, CREATININE, CALCIUM in the last 72 hours.  Studies/Results: Ct Head Wo Contrast  10/22/2015  CLINICAL DATA:  Dysarthria and left upper extremity weakness EXAM: CT HEAD WITHOUT CONTRAST TECHNIQUE: Contiguous axial images were obtained from the base of the skull through the vertex without intravenous contrast. COMPARISON:  October 18, 2015 FINDINGS: The ventricles are normal in size and configuration. There is no intracranial mass, hemorrhage, extra-axial fluid collection, or midline shift. There is mild small vessel disease in the centra semiovale bilaterally. Elsewhere gray-white compartments appear normal. No acute infarct is evident. The bony calvarium appears intact. The mastoid air cells are clear. No intraorbital lesions are evident. There is nasal turbinate edema bilaterally with nares obstruction bilaterally. IMPRESSION: Mild periventricular small vessel disease. No acute infarct evident. No hemorrhage or mass effect. Nasal turbinate edema bilaterally with nares obstruction due to this edema bilaterally. Electronically Signed   By: Bretta BangWilliam  Woodruff III M.D.   On: 10/22/2015 12:21    Assessment/Plan: Awaiting Neurology clearance for discharge to SNF.    LOS: 7 days    Ronda Rajkumar D, MD 10/24/2015, 7:48 AM

## 2015-10-24 NOTE — Progress Notes (Signed)
Discharged via ambulance transport; all belongings returned.

## 2015-10-24 NOTE — Discharge Summary (Signed)
Physician Discharge Summary  Patient ID: Gayla MedicusRita K Schwandt MRN: [id number] DOB/AGE: 70/08/1945 70 y.o.  Admit date: 10/17/2015 Discharge date: 10/24/2015  Admission Diagnoses:lumbar radiculopathy  depression  Discharge Diagnoses:  Principal Problem:   Other depression due to general medical condition Active Problems:   Back pain   Lumbar radiculopathy   Discharged Condition: no pain Hospital Course: pain control  Consults: Psych, Neurology  Significant Diagnostic Studies: none  Treatments: pain control  Discharge Exam: Blood pressure 175/78, pulse 75, temperature 98.9 F (37.2 C), temperature source Oral, resp. rate 16, height 5\' 3"  (1.6 m), weight 68.04 kg (150 lb), SpO2 95 %. No weakness  Disposition: SNF  Follow-up Information    Follow up with Karn CassisBOTERO,ERNESTO M, MD In 2 weeks.   Specialty:  Neurosurgery   Contact information:   [address] [place] [id number] [phone]         Medication List    STOP taking these medications        cyclobenzaprine 10 MG tablet  Commonly known as:  FLEXERIL     diazepam 5 MG tablet  Commonly known as:  VALIUM     methadone 10 MG tablet  Commonly known as:  DOLOPHINE     Oxycodone HCl 10 MG Tabs      TAKE these medications        amphetamine-dextroamphetamine 30 MG 24 hr capsule  Commonly known as:  ADDERALL XR  Take 30 mg by mouth daily before breakfast.     baclofen 10 MG tablet  Commonly known as:  LIORESAL  Take 0.5 tablets (5 mg total) by mouth 3 (three) times daily.     DULoxetine 60 MG capsule  Commonly known as:  CYMBALTA  Take 60 mg by mouth 2 (two) times daily.     DULoxetine 30 MG capsule  Commonly known as:  CYMBALTA  Take 1 capsule (30 mg total) by mouth 2 (two) times daily.     levothyroxine 100 MCG tablet  Commonly known as:  SYNTHROID, LEVOTHROID  Take 100 mcg by mouth daily.     loratadine 10 MG tablet  Commonly known as:  CLARITIN  Take 10 mg by mouth daily as  needed for allergies.     mometasone 50 MCG/ACT nasal spray  Commonly known as:  NASONEX  uses 1 spray in each nostril at bedtime     oxyCODONE-acetaminophen 5-325 MG tablet  Commonly known as:  PERCOCET/ROXICET  Take 1-2 tablets by mouth every 4 (four) hours as needed for moderate pain.     pregabalin 25 MG capsule  Commonly known as:  LYRICA  Take 1 capsule (25 mg total) by mouth 3 (three) times daily.     propranolol ER 120 MG 24 hr capsule  Commonly known as:  INDERAL LA  Take 120 mg by mouth daily before breakfast.     traZODone 100 MG tablet  Commonly known as:  DESYREL  Take 50 mg by mouth at bedtime.        Signed: Loura HaltBenjamin Jared Duvall Comes 10/24/2015, 2:16 PM

## 2015-10-27 LAB — RHEUMATOID FACTOR: RHEUMATOID FACTOR: 11.5 [IU]/mL (ref 0.0–13.9)

## 2017-11-16 MED ORDER — METHADONE HCL 10 MG PO TABS
30.00 | ORAL_TABLET | ORAL | Status: DC
Start: 2017-11-17 — End: 2017-11-16

## 2017-11-16 MED ORDER — GENERIC EXTERNAL MEDICATION
Status: DC
Start: ? — End: 2017-11-16

## 2017-11-16 MED ORDER — LEVOTHYROXINE SODIUM 100 MCG PO TABS
100.00 | ORAL_TABLET | ORAL | Status: DC
Start: 2017-11-17 — End: 2017-11-16

## 2017-11-16 MED ORDER — TRAZODONE HCL 50 MG PO TABS
50.00 | ORAL_TABLET | ORAL | Status: DC
Start: 2017-11-16 — End: 2017-11-16

## 2017-11-16 MED ORDER — NALOXONE HCL 0.4 MG/ML IJ SOLN
0.10 | INTRAMUSCULAR | Status: DC
Start: ? — End: 2017-11-16

## 2017-11-16 MED ORDER — FLUTICASONE PROPIONATE 50 MCG/ACT NA SUSP
1.00 | NASAL | Status: DC
Start: 2017-11-17 — End: 2017-11-16

## 2017-11-16 MED ORDER — PREGABALIN 100 MG PO CAPS
100.00 | ORAL_CAPSULE | ORAL | Status: DC
Start: 2017-11-16 — End: 2017-11-16

## 2017-11-16 MED ORDER — METHADONE HCL 10 MG PO TABS
20.00 | ORAL_TABLET | ORAL | Status: DC
Start: 2017-11-16 — End: 2017-11-16

## 2017-11-16 MED ORDER — PROPRANOLOL HCL 40 MG PO TABS
80.00 | ORAL_TABLET | ORAL | Status: DC
Start: 2017-11-17 — End: 2017-11-16

## 2017-11-16 MED ORDER — DULOXETINE HCL 60 MG PO CPEP
60.00 | ORAL_CAPSULE | ORAL | Status: DC
Start: 2017-11-17 — End: 2017-11-16

## 2017-11-16 MED ORDER — BACLOFEN 10 MG PO TABS
20.00 | ORAL_TABLET | ORAL | Status: DC
Start: 2017-11-16 — End: 2017-11-16

## 2017-11-16 MED ORDER — SENNOSIDES-DOCUSATE SODIUM 8.6-50 MG PO TABS
1.00 | ORAL_TABLET | ORAL | Status: DC
Start: 2017-11-16 — End: 2017-11-16

## 2017-11-16 MED ORDER — ONDANSETRON HCL 4 MG/2ML IJ SOLN
4.00 | INTRAMUSCULAR | Status: DC
Start: ? — End: 2017-11-16

## 2017-11-16 MED ORDER — ACETAMINOPHEN 325 MG PO TABS
650.00 | ORAL_TABLET | ORAL | Status: DC
Start: ? — End: 2017-11-16

## 2017-11-16 MED ORDER — HYDROCODONE-ACETAMINOPHEN 10-325 MG PO TABS
1.00 | ORAL_TABLET | ORAL | Status: DC
Start: ? — End: 2017-11-16

## 2017-11-16 MED ORDER — SODIUM CHLORIDE 0.9 % IV SOLN
INTRAVENOUS | Status: DC
Start: ? — End: 2017-11-16

## 2017-11-16 MED ORDER — LORATADINE 10 MG PO TABS
10.00 | ORAL_TABLET | ORAL | Status: DC
Start: 2017-11-16 — End: 2017-11-16

## 2019-02-28 DIAGNOSIS — Z79891 Long term (current) use of opiate analgesic: Secondary | ICD-10-CM | POA: Diagnosis not present

## 2019-02-28 DIAGNOSIS — M7918 Myalgia, other site: Secondary | ICD-10-CM | POA: Diagnosis not present

## 2019-02-28 DIAGNOSIS — Z79899 Other long term (current) drug therapy: Secondary | ICD-10-CM | POA: Diagnosis not present

## 2019-02-28 DIAGNOSIS — M4325 Fusion of spine, thoracolumbar region: Secondary | ICD-10-CM | POA: Diagnosis not present

## 2019-02-28 DIAGNOSIS — G894 Chronic pain syndrome: Secondary | ICD-10-CM | POA: Diagnosis not present

## 2019-02-28 DIAGNOSIS — M545 Low back pain: Secondary | ICD-10-CM | POA: Diagnosis not present

## 2019-03-01 DIAGNOSIS — M549 Dorsalgia, unspecified: Secondary | ICD-10-CM | POA: Diagnosis not present

## 2019-03-01 DIAGNOSIS — Z6827 Body mass index (BMI) 27.0-27.9, adult: Secondary | ICD-10-CM | POA: Diagnosis not present

## 2019-03-01 DIAGNOSIS — Z789 Other specified health status: Secondary | ICD-10-CM | POA: Diagnosis not present

## 2019-03-01 DIAGNOSIS — I10 Essential (primary) hypertension: Secondary | ICD-10-CM | POA: Diagnosis not present

## 2019-03-01 DIAGNOSIS — Z299 Encounter for prophylactic measures, unspecified: Secondary | ICD-10-CM | POA: Diagnosis not present

## 2019-03-01 DIAGNOSIS — G479 Sleep disorder, unspecified: Secondary | ICD-10-CM | POA: Diagnosis not present

## 2019-03-01 DIAGNOSIS — E039 Hypothyroidism, unspecified: Secondary | ICD-10-CM | POA: Diagnosis not present

## 2019-03-15 DIAGNOSIS — I10 Essential (primary) hypertension: Secondary | ICD-10-CM | POA: Diagnosis not present

## 2019-03-15 DIAGNOSIS — Z789 Other specified health status: Secondary | ICD-10-CM | POA: Diagnosis not present

## 2019-03-15 DIAGNOSIS — Z299 Encounter for prophylactic measures, unspecified: Secondary | ICD-10-CM | POA: Diagnosis not present

## 2019-03-15 DIAGNOSIS — J069 Acute upper respiratory infection, unspecified: Secondary | ICD-10-CM | POA: Diagnosis not present

## 2019-03-15 DIAGNOSIS — Z6827 Body mass index (BMI) 27.0-27.9, adult: Secondary | ICD-10-CM | POA: Diagnosis not present

## 2019-03-27 DIAGNOSIS — G894 Chronic pain syndrome: Secondary | ICD-10-CM | POA: Diagnosis not present

## 2019-03-27 DIAGNOSIS — M13862 Other specified arthritis, left knee: Secondary | ICD-10-CM | POA: Diagnosis not present

## 2019-03-27 DIAGNOSIS — F418 Other specified anxiety disorders: Secondary | ICD-10-CM | POA: Diagnosis not present

## 2019-03-27 DIAGNOSIS — M545 Low back pain: Secondary | ICD-10-CM | POA: Diagnosis not present

## 2019-03-27 DIAGNOSIS — Z79899 Other long term (current) drug therapy: Secondary | ICD-10-CM | POA: Diagnosis not present

## 2019-05-06 DIAGNOSIS — M545 Low back pain: Secondary | ICD-10-CM | POA: Diagnosis not present

## 2019-05-06 DIAGNOSIS — G894 Chronic pain syndrome: Secondary | ICD-10-CM | POA: Diagnosis not present

## 2019-05-06 DIAGNOSIS — M4325 Fusion of spine, thoracolumbar region: Secondary | ICD-10-CM | POA: Diagnosis not present

## 2019-05-06 DIAGNOSIS — M7918 Myalgia, other site: Secondary | ICD-10-CM | POA: Diagnosis not present

## 2019-05-28 DIAGNOSIS — M461 Sacroiliitis, not elsewhere classified: Secondary | ICD-10-CM | POA: Diagnosis not present

## 2019-06-03 DIAGNOSIS — R5383 Other fatigue: Secondary | ICD-10-CM | POA: Diagnosis not present

## 2019-06-03 DIAGNOSIS — N182 Chronic kidney disease, stage 2 (mild): Secondary | ICD-10-CM | POA: Diagnosis not present

## 2019-06-03 DIAGNOSIS — Z299 Encounter for prophylactic measures, unspecified: Secondary | ICD-10-CM | POA: Diagnosis not present

## 2019-06-03 DIAGNOSIS — E559 Vitamin D deficiency, unspecified: Secondary | ICD-10-CM | POA: Diagnosis not present

## 2019-06-03 DIAGNOSIS — E039 Hypothyroidism, unspecified: Secondary | ICD-10-CM | POA: Diagnosis not present

## 2019-06-03 DIAGNOSIS — I10 Essential (primary) hypertension: Secondary | ICD-10-CM | POA: Diagnosis not present

## 2019-06-03 DIAGNOSIS — E7800 Pure hypercholesterolemia, unspecified: Secondary | ICD-10-CM | POA: Diagnosis not present

## 2019-06-03 DIAGNOSIS — Z6826 Body mass index (BMI) 26.0-26.9, adult: Secondary | ICD-10-CM | POA: Diagnosis not present

## 2019-06-28 DIAGNOSIS — M545 Low back pain: Secondary | ICD-10-CM | POA: Diagnosis not present

## 2019-06-28 DIAGNOSIS — G894 Chronic pain syndrome: Secondary | ICD-10-CM | POA: Diagnosis not present

## 2019-06-28 DIAGNOSIS — Z79899 Other long term (current) drug therapy: Secondary | ICD-10-CM | POA: Diagnosis not present

## 2019-06-28 DIAGNOSIS — M13862 Other specified arthritis, left knee: Secondary | ICD-10-CM | POA: Diagnosis not present

## 2019-06-28 DIAGNOSIS — M797 Fibromyalgia: Secondary | ICD-10-CM | POA: Diagnosis not present

## 2019-06-28 DIAGNOSIS — F418 Other specified anxiety disorders: Secondary | ICD-10-CM | POA: Diagnosis not present

## 2019-07-09 DIAGNOSIS — Z1211 Encounter for screening for malignant neoplasm of colon: Secondary | ICD-10-CM | POA: Diagnosis not present

## 2019-07-09 DIAGNOSIS — Z299 Encounter for prophylactic measures, unspecified: Secondary | ICD-10-CM | POA: Diagnosis not present

## 2019-07-09 DIAGNOSIS — E7800 Pure hypercholesterolemia, unspecified: Secondary | ICD-10-CM | POA: Diagnosis not present

## 2019-07-09 DIAGNOSIS — Z1331 Encounter for screening for depression: Secondary | ICD-10-CM | POA: Diagnosis not present

## 2019-07-09 DIAGNOSIS — Z0000 Encounter for general adult medical examination without abnormal findings: Secondary | ICD-10-CM | POA: Diagnosis not present

## 2019-07-09 DIAGNOSIS — Z1339 Encounter for screening examination for other mental health and behavioral disorders: Secondary | ICD-10-CM | POA: Diagnosis not present

## 2019-07-09 DIAGNOSIS — Z7189 Other specified counseling: Secondary | ICD-10-CM | POA: Diagnosis not present

## 2019-07-09 DIAGNOSIS — I10 Essential (primary) hypertension: Secondary | ICD-10-CM | POA: Diagnosis not present

## 2019-07-09 DIAGNOSIS — Z6827 Body mass index (BMI) 27.0-27.9, adult: Secondary | ICD-10-CM | POA: Diagnosis not present

## 2019-07-16 DIAGNOSIS — I10 Essential (primary) hypertension: Secondary | ICD-10-CM | POA: Diagnosis not present

## 2019-07-16 DIAGNOSIS — N183 Chronic kidney disease, stage 3 unspecified: Secondary | ICD-10-CM | POA: Diagnosis not present

## 2019-07-16 DIAGNOSIS — Z6826 Body mass index (BMI) 26.0-26.9, adult: Secondary | ICD-10-CM | POA: Diagnosis not present

## 2019-07-16 DIAGNOSIS — Z299 Encounter for prophylactic measures, unspecified: Secondary | ICD-10-CM | POA: Diagnosis not present

## 2019-07-16 DIAGNOSIS — E039 Hypothyroidism, unspecified: Secondary | ICD-10-CM | POA: Diagnosis not present

## 2019-07-16 DIAGNOSIS — Z2821 Immunization not carried out because of patient refusal: Secondary | ICD-10-CM | POA: Diagnosis not present

## 2019-07-23 DIAGNOSIS — M4325 Fusion of spine, thoracolumbar region: Secondary | ICD-10-CM | POA: Diagnosis not present

## 2019-07-23 DIAGNOSIS — M461 Sacroiliitis, not elsewhere classified: Secondary | ICD-10-CM | POA: Diagnosis not present

## 2019-08-06 DIAGNOSIS — M13862 Other specified arthritis, left knee: Secondary | ICD-10-CM | POA: Diagnosis not present

## 2019-08-06 DIAGNOSIS — M170 Bilateral primary osteoarthritis of knee: Secondary | ICD-10-CM | POA: Diagnosis not present

## 2019-08-06 DIAGNOSIS — M1712 Unilateral primary osteoarthritis, left knee: Secondary | ICD-10-CM | POA: Diagnosis not present

## 2019-08-08 DIAGNOSIS — Z6826 Body mass index (BMI) 26.0-26.9, adult: Secondary | ICD-10-CM | POA: Diagnosis not present

## 2019-08-08 DIAGNOSIS — Z299 Encounter for prophylactic measures, unspecified: Secondary | ICD-10-CM | POA: Diagnosis not present

## 2019-08-08 DIAGNOSIS — Z713 Dietary counseling and surveillance: Secondary | ICD-10-CM | POA: Diagnosis not present

## 2019-08-08 DIAGNOSIS — R519 Headache, unspecified: Secondary | ICD-10-CM | POA: Diagnosis not present

## 2019-08-08 DIAGNOSIS — I10 Essential (primary) hypertension: Secondary | ICD-10-CM | POA: Diagnosis not present

## 2019-08-10 DIAGNOSIS — N183 Chronic kidney disease, stage 3 unspecified: Secondary | ICD-10-CM | POA: Diagnosis not present

## 2019-08-10 DIAGNOSIS — R001 Bradycardia, unspecified: Secondary | ICD-10-CM | POA: Diagnosis not present

## 2019-08-10 DIAGNOSIS — Z79899 Other long term (current) drug therapy: Secondary | ICD-10-CM | POA: Diagnosis not present

## 2019-08-10 DIAGNOSIS — E876 Hypokalemia: Secondary | ICD-10-CM | POA: Diagnosis not present

## 2019-08-10 DIAGNOSIS — I129 Hypertensive chronic kidney disease with stage 1 through stage 4 chronic kidney disease, or unspecified chronic kidney disease: Secondary | ICD-10-CM | POA: Diagnosis not present

## 2019-08-10 DIAGNOSIS — R519 Headache, unspecified: Secondary | ICD-10-CM | POA: Diagnosis not present

## 2019-08-10 DIAGNOSIS — R7989 Other specified abnormal findings of blood chemistry: Secondary | ICD-10-CM | POA: Diagnosis not present

## 2019-08-10 DIAGNOSIS — I10 Essential (primary) hypertension: Secondary | ICD-10-CM | POA: Diagnosis not present

## 2019-09-05 DIAGNOSIS — I10 Essential (primary) hypertension: Secondary | ICD-10-CM | POA: Diagnosis not present

## 2019-09-05 DIAGNOSIS — R432 Parageusia: Secondary | ICD-10-CM | POA: Diagnosis not present

## 2019-09-05 DIAGNOSIS — Z299 Encounter for prophylactic measures, unspecified: Secondary | ICD-10-CM | POA: Diagnosis not present

## 2019-09-05 DIAGNOSIS — Z6826 Body mass index (BMI) 26.0-26.9, adult: Secondary | ICD-10-CM | POA: Diagnosis not present

## 2019-09-05 DIAGNOSIS — N183 Chronic kidney disease, stage 3 unspecified: Secondary | ICD-10-CM | POA: Diagnosis not present

## 2019-09-05 DIAGNOSIS — E039 Hypothyroidism, unspecified: Secondary | ICD-10-CM | POA: Diagnosis not present

## 2019-09-05 DIAGNOSIS — Z789 Other specified health status: Secondary | ICD-10-CM | POA: Diagnosis not present

## 2019-09-17 DIAGNOSIS — N183 Chronic kidney disease, stage 3 unspecified: Secondary | ICD-10-CM | POA: Diagnosis not present

## 2019-09-17 DIAGNOSIS — Z6826 Body mass index (BMI) 26.0-26.9, adult: Secondary | ICD-10-CM | POA: Diagnosis not present

## 2019-09-17 DIAGNOSIS — Z299 Encounter for prophylactic measures, unspecified: Secondary | ICD-10-CM | POA: Diagnosis not present

## 2019-09-17 DIAGNOSIS — E039 Hypothyroidism, unspecified: Secondary | ICD-10-CM | POA: Diagnosis not present

## 2019-09-17 DIAGNOSIS — I10 Essential (primary) hypertension: Secondary | ICD-10-CM | POA: Diagnosis not present

## 2019-09-17 DIAGNOSIS — G250 Essential tremor: Secondary | ICD-10-CM | POA: Diagnosis not present

## 2019-09-25 DIAGNOSIS — G894 Chronic pain syndrome: Secondary | ICD-10-CM | POA: Diagnosis not present

## 2019-09-25 DIAGNOSIS — Z79899 Other long term (current) drug therapy: Secondary | ICD-10-CM | POA: Diagnosis not present

## 2019-09-25 DIAGNOSIS — M13862 Other specified arthritis, left knee: Secondary | ICD-10-CM | POA: Diagnosis not present

## 2019-09-25 DIAGNOSIS — F418 Other specified anxiety disorders: Secondary | ICD-10-CM | POA: Diagnosis not present

## 2019-09-25 DIAGNOSIS — M545 Low back pain: Secondary | ICD-10-CM | POA: Diagnosis not present

## 2019-10-01 DIAGNOSIS — I10 Essential (primary) hypertension: Secondary | ICD-10-CM | POA: Diagnosis not present

## 2019-10-01 DIAGNOSIS — E039 Hypothyroidism, unspecified: Secondary | ICD-10-CM | POA: Diagnosis not present

## 2019-10-01 DIAGNOSIS — N183 Chronic kidney disease, stage 3 unspecified: Secondary | ICD-10-CM | POA: Diagnosis not present

## 2019-10-01 DIAGNOSIS — Z789 Other specified health status: Secondary | ICD-10-CM | POA: Diagnosis not present

## 2019-10-01 DIAGNOSIS — Z299 Encounter for prophylactic measures, unspecified: Secondary | ICD-10-CM | POA: Diagnosis not present

## 2019-10-18 DIAGNOSIS — L218 Other seborrheic dermatitis: Secondary | ICD-10-CM | POA: Diagnosis not present

## 2019-10-21 DIAGNOSIS — J302 Other seasonal allergic rhinitis: Secondary | ICD-10-CM | POA: Diagnosis not present

## 2019-10-21 DIAGNOSIS — Z299 Encounter for prophylactic measures, unspecified: Secondary | ICD-10-CM | POA: Diagnosis not present

## 2019-10-21 DIAGNOSIS — I10 Essential (primary) hypertension: Secondary | ICD-10-CM | POA: Diagnosis not present

## 2019-10-21 DIAGNOSIS — J020 Streptococcal pharyngitis: Secondary | ICD-10-CM | POA: Diagnosis not present

## 2019-10-22 DIAGNOSIS — I10 Essential (primary) hypertension: Secondary | ICD-10-CM | POA: Diagnosis not present

## 2019-12-10 DIAGNOSIS — I10 Essential (primary) hypertension: Secondary | ICD-10-CM | POA: Diagnosis not present

## 2019-12-10 DIAGNOSIS — E039 Hypothyroidism, unspecified: Secondary | ICD-10-CM | POA: Diagnosis not present

## 2019-12-10 DIAGNOSIS — Z299 Encounter for prophylactic measures, unspecified: Secondary | ICD-10-CM | POA: Diagnosis not present

## 2019-12-10 DIAGNOSIS — N183 Chronic kidney disease, stage 3 unspecified: Secondary | ICD-10-CM | POA: Diagnosis not present

## 2019-12-20 DIAGNOSIS — F329 Major depressive disorder, single episode, unspecified: Secondary | ICD-10-CM | POA: Diagnosis not present

## 2019-12-20 DIAGNOSIS — M13862 Other specified arthritis, left knee: Secondary | ICD-10-CM | POA: Diagnosis not present

## 2019-12-20 DIAGNOSIS — F418 Other specified anxiety disorders: Secondary | ICD-10-CM | POA: Diagnosis not present

## 2019-12-20 DIAGNOSIS — G894 Chronic pain syndrome: Secondary | ICD-10-CM | POA: Diagnosis not present

## 2019-12-20 DIAGNOSIS — Z79899 Other long term (current) drug therapy: Secondary | ICD-10-CM | POA: Diagnosis not present

## 2019-12-20 DIAGNOSIS — M545 Low back pain: Secondary | ICD-10-CM | POA: Diagnosis not present

## 2019-12-20 DIAGNOSIS — M797 Fibromyalgia: Secondary | ICD-10-CM | POA: Diagnosis not present

## 2020-01-03 DIAGNOSIS — N183 Chronic kidney disease, stage 3 unspecified: Secondary | ICD-10-CM | POA: Diagnosis not present

## 2020-01-03 DIAGNOSIS — G4700 Insomnia, unspecified: Secondary | ICD-10-CM | POA: Diagnosis not present

## 2020-01-03 DIAGNOSIS — W57XXXA Bitten or stung by nonvenomous insect and other nonvenomous arthropods, initial encounter: Secondary | ICD-10-CM | POA: Diagnosis not present

## 2020-01-03 DIAGNOSIS — Z299 Encounter for prophylactic measures, unspecified: Secondary | ICD-10-CM | POA: Diagnosis not present

## 2020-01-03 DIAGNOSIS — I10 Essential (primary) hypertension: Secondary | ICD-10-CM | POA: Diagnosis not present

## 2020-01-22 DIAGNOSIS — I10 Essential (primary) hypertension: Secondary | ICD-10-CM | POA: Diagnosis not present

## 2020-01-23 DIAGNOSIS — F633 Trichotillomania: Secondary | ICD-10-CM | POA: Diagnosis not present

## 2020-02-10 DIAGNOSIS — I10 Essential (primary) hypertension: Secondary | ICD-10-CM | POA: Diagnosis not present

## 2020-02-10 DIAGNOSIS — Z299 Encounter for prophylactic measures, unspecified: Secondary | ICD-10-CM | POA: Diagnosis not present

## 2020-02-10 DIAGNOSIS — M1712 Unilateral primary osteoarthritis, left knee: Secondary | ICD-10-CM | POA: Diagnosis not present

## 2020-02-10 DIAGNOSIS — N183 Chronic kidney disease, stage 3 unspecified: Secondary | ICD-10-CM | POA: Diagnosis not present

## 2020-02-10 DIAGNOSIS — E039 Hypothyroidism, unspecified: Secondary | ICD-10-CM | POA: Diagnosis not present

## 2020-02-10 DIAGNOSIS — M1710 Unilateral primary osteoarthritis, unspecified knee: Secondary | ICD-10-CM | POA: Diagnosis not present

## 2020-02-17 DIAGNOSIS — M545 Low back pain: Secondary | ICD-10-CM | POA: Diagnosis not present

## 2020-02-17 DIAGNOSIS — M13862 Other specified arthritis, left knee: Secondary | ICD-10-CM | POA: Diagnosis not present

## 2020-02-17 DIAGNOSIS — Z79899 Other long term (current) drug therapy: Secondary | ICD-10-CM | POA: Diagnosis not present

## 2020-02-17 DIAGNOSIS — F418 Other specified anxiety disorders: Secondary | ICD-10-CM | POA: Diagnosis not present

## 2020-02-17 DIAGNOSIS — Z5181 Encounter for therapeutic drug level monitoring: Secondary | ICD-10-CM | POA: Diagnosis not present

## 2020-02-17 DIAGNOSIS — M797 Fibromyalgia: Secondary | ICD-10-CM | POA: Diagnosis not present

## 2020-02-17 DIAGNOSIS — F329 Major depressive disorder, single episode, unspecified: Secondary | ICD-10-CM | POA: Diagnosis not present

## 2020-02-17 DIAGNOSIS — G894 Chronic pain syndrome: Secondary | ICD-10-CM | POA: Diagnosis not present

## 2020-02-20 DIAGNOSIS — I10 Essential (primary) hypertension: Secondary | ICD-10-CM | POA: Diagnosis not present

## 2020-02-24 DIAGNOSIS — Z299 Encounter for prophylactic measures, unspecified: Secondary | ICD-10-CM | POA: Diagnosis not present

## 2020-02-24 DIAGNOSIS — I10 Essential (primary) hypertension: Secondary | ICD-10-CM | POA: Diagnosis not present

## 2020-02-24 DIAGNOSIS — M25562 Pain in left knee: Secondary | ICD-10-CM | POA: Diagnosis not present

## 2020-03-25 DIAGNOSIS — Z20828 Contact with and (suspected) exposure to other viral communicable diseases: Secondary | ICD-10-CM | POA: Diagnosis not present

## 2020-03-26 DIAGNOSIS — Z20822 Contact with and (suspected) exposure to covid-19: Secondary | ICD-10-CM | POA: Diagnosis not present

## 2020-03-26 DIAGNOSIS — I10 Essential (primary) hypertension: Secondary | ICD-10-CM | POA: Diagnosis not present

## 2020-03-26 DIAGNOSIS — Z299 Encounter for prophylactic measures, unspecified: Secondary | ICD-10-CM | POA: Diagnosis not present

## 2020-03-26 DIAGNOSIS — Z789 Other specified health status: Secondary | ICD-10-CM | POA: Diagnosis not present

## 2020-04-23 DIAGNOSIS — I10 Essential (primary) hypertension: Secondary | ICD-10-CM | POA: Diagnosis not present

## 2020-04-24 DIAGNOSIS — S92321A Displaced fracture of second metatarsal bone, right foot, initial encounter for closed fracture: Secondary | ICD-10-CM | POA: Diagnosis not present

## 2020-04-24 DIAGNOSIS — N183 Chronic kidney disease, stage 3 unspecified: Secondary | ICD-10-CM | POA: Diagnosis not present

## 2020-04-24 DIAGNOSIS — Z299 Encounter for prophylactic measures, unspecified: Secondary | ICD-10-CM | POA: Diagnosis not present

## 2020-04-24 DIAGNOSIS — F32A Depression, unspecified: Secondary | ICD-10-CM | POA: Diagnosis not present

## 2020-04-24 DIAGNOSIS — S92311A Displaced fracture of first metatarsal bone, right foot, initial encounter for closed fracture: Secondary | ICD-10-CM | POA: Diagnosis not present

## 2020-04-24 DIAGNOSIS — M19071 Primary osteoarthritis, right ankle and foot: Secondary | ICD-10-CM | POA: Diagnosis not present

## 2020-04-24 DIAGNOSIS — S92331A Displaced fracture of third metatarsal bone, right foot, initial encounter for closed fracture: Secondary | ICD-10-CM | POA: Diagnosis not present

## 2020-04-24 DIAGNOSIS — I10 Essential (primary) hypertension: Secondary | ICD-10-CM | POA: Diagnosis not present

## 2020-04-24 DIAGNOSIS — S92341A Displaced fracture of fourth metatarsal bone, right foot, initial encounter for closed fracture: Secondary | ICD-10-CM | POA: Diagnosis not present

## 2020-04-24 DIAGNOSIS — E039 Hypothyroidism, unspecified: Secondary | ICD-10-CM | POA: Diagnosis not present

## 2020-04-24 DIAGNOSIS — X501XXA Overexertion from prolonged static or awkward postures, initial encounter: Secondary | ICD-10-CM | POA: Diagnosis not present

## 2020-04-24 DIAGNOSIS — Z6824 Body mass index (BMI) 24.0-24.9, adult: Secondary | ICD-10-CM | POA: Diagnosis not present

## 2020-04-24 DIAGNOSIS — M797 Fibromyalgia: Secondary | ICD-10-CM | POA: Diagnosis not present

## 2020-04-28 DIAGNOSIS — Z299 Encounter for prophylactic measures, unspecified: Secondary | ICD-10-CM | POA: Diagnosis not present

## 2020-04-30 DIAGNOSIS — S92311A Displaced fracture of first metatarsal bone, right foot, initial encounter for closed fracture: Secondary | ICD-10-CM | POA: Diagnosis not present

## 2020-04-30 DIAGNOSIS — S92301A Fracture of unspecified metatarsal bone(s), right foot, initial encounter for closed fracture: Secondary | ICD-10-CM | POA: Diagnosis not present

## 2020-05-04 DIAGNOSIS — Z5181 Encounter for therapeutic drug level monitoring: Secondary | ICD-10-CM | POA: Diagnosis not present

## 2020-05-04 DIAGNOSIS — F418 Other specified anxiety disorders: Secondary | ICD-10-CM | POA: Diagnosis not present

## 2020-05-04 DIAGNOSIS — M797 Fibromyalgia: Secondary | ICD-10-CM | POA: Diagnosis not present

## 2020-05-04 DIAGNOSIS — Z79891 Long term (current) use of opiate analgesic: Secondary | ICD-10-CM | POA: Diagnosis not present

## 2020-05-04 DIAGNOSIS — Z79899 Other long term (current) drug therapy: Secondary | ICD-10-CM | POA: Diagnosis not present

## 2020-05-04 DIAGNOSIS — M13862 Other specified arthritis, left knee: Secondary | ICD-10-CM | POA: Diagnosis not present

## 2020-05-04 DIAGNOSIS — F329 Major depressive disorder, single episode, unspecified: Secondary | ICD-10-CM | POA: Diagnosis not present

## 2020-05-04 DIAGNOSIS — G894 Chronic pain syndrome: Secondary | ICD-10-CM | POA: Diagnosis not present

## 2020-05-07 DIAGNOSIS — N183 Chronic kidney disease, stage 3 unspecified: Secondary | ICD-10-CM | POA: Diagnosis not present

## 2020-05-12 DIAGNOSIS — R531 Weakness: Secondary | ICD-10-CM | POA: Diagnosis not present

## 2020-05-12 DIAGNOSIS — I10 Essential (primary) hypertension: Secondary | ICD-10-CM | POA: Diagnosis not present

## 2020-05-12 DIAGNOSIS — Z299 Encounter for prophylactic measures, unspecified: Secondary | ICD-10-CM | POA: Diagnosis not present

## 2020-05-12 DIAGNOSIS — N183 Chronic kidney disease, stage 3 unspecified: Secondary | ICD-10-CM | POA: Diagnosis not present

## 2020-05-20 DIAGNOSIS — E876 Hypokalemia: Secondary | ICD-10-CM | POA: Diagnosis not present

## 2020-05-20 DIAGNOSIS — I129 Hypertensive chronic kidney disease with stage 1 through stage 4 chronic kidney disease, or unspecified chronic kidney disease: Secondary | ICD-10-CM | POA: Diagnosis not present

## 2020-05-20 DIAGNOSIS — Z7189 Other specified counseling: Secondary | ICD-10-CM | POA: Diagnosis not present

## 2020-05-20 DIAGNOSIS — Z79899 Other long term (current) drug therapy: Secondary | ICD-10-CM | POA: Diagnosis not present

## 2020-05-20 DIAGNOSIS — D638 Anemia in other chronic diseases classified elsewhere: Secondary | ICD-10-CM | POA: Diagnosis not present

## 2020-05-20 DIAGNOSIS — R809 Proteinuria, unspecified: Secondary | ICD-10-CM | POA: Diagnosis not present

## 2020-05-20 DIAGNOSIS — N1832 Chronic kidney disease, stage 3b: Secondary | ICD-10-CM | POA: Diagnosis not present

## 2020-05-23 DIAGNOSIS — I10 Essential (primary) hypertension: Secondary | ICD-10-CM | POA: Diagnosis not present

## 2020-05-27 DIAGNOSIS — Z6824 Body mass index (BMI) 24.0-24.9, adult: Secondary | ICD-10-CM | POA: Diagnosis not present

## 2020-05-27 DIAGNOSIS — Z23 Encounter for immunization: Secondary | ICD-10-CM | POA: Diagnosis not present

## 2020-05-27 DIAGNOSIS — G250 Essential tremor: Secondary | ICD-10-CM | POA: Diagnosis not present

## 2020-05-27 DIAGNOSIS — M1712 Unilateral primary osteoarthritis, left knee: Secondary | ICD-10-CM | POA: Diagnosis not present

## 2020-05-27 DIAGNOSIS — Z299 Encounter for prophylactic measures, unspecified: Secondary | ICD-10-CM | POA: Diagnosis not present

## 2020-05-27 DIAGNOSIS — I10 Essential (primary) hypertension: Secondary | ICD-10-CM | POA: Diagnosis not present

## 2020-05-29 DIAGNOSIS — R809 Proteinuria, unspecified: Secondary | ICD-10-CM | POA: Diagnosis not present

## 2020-05-29 DIAGNOSIS — N1832 Chronic kidney disease, stage 3b: Secondary | ICD-10-CM | POA: Diagnosis not present

## 2020-05-29 DIAGNOSIS — E876 Hypokalemia: Secondary | ICD-10-CM | POA: Diagnosis not present

## 2020-05-29 DIAGNOSIS — I129 Hypertensive chronic kidney disease with stage 1 through stage 4 chronic kidney disease, or unspecified chronic kidney disease: Secondary | ICD-10-CM | POA: Diagnosis not present

## 2020-05-29 DIAGNOSIS — D638 Anemia in other chronic diseases classified elsewhere: Secondary | ICD-10-CM | POA: Diagnosis not present

## 2020-05-29 DIAGNOSIS — Z79899 Other long term (current) drug therapy: Secondary | ICD-10-CM | POA: Diagnosis not present

## 2020-06-16 DIAGNOSIS — N1831 Chronic kidney disease, stage 3a: Secondary | ICD-10-CM | POA: Diagnosis not present

## 2020-06-16 DIAGNOSIS — D638 Anemia in other chronic diseases classified elsewhere: Secondary | ICD-10-CM | POA: Diagnosis not present

## 2020-06-16 DIAGNOSIS — E559 Vitamin D deficiency, unspecified: Secondary | ICD-10-CM | POA: Diagnosis not present

## 2020-06-16 DIAGNOSIS — I129 Hypertensive chronic kidney disease with stage 1 through stage 4 chronic kidney disease, or unspecified chronic kidney disease: Secondary | ICD-10-CM | POA: Diagnosis not present

## 2020-06-16 DIAGNOSIS — N2581 Secondary hyperparathyroidism of renal origin: Secondary | ICD-10-CM | POA: Diagnosis not present

## 2020-06-16 DIAGNOSIS — N170 Acute kidney failure with tubular necrosis: Secondary | ICD-10-CM | POA: Diagnosis not present

## 2020-06-16 DIAGNOSIS — E876 Hypokalemia: Secondary | ICD-10-CM | POA: Diagnosis not present

## 2020-06-22 DIAGNOSIS — N183 Chronic kidney disease, stage 3 unspecified: Secondary | ICD-10-CM | POA: Diagnosis not present

## 2020-06-22 DIAGNOSIS — Z299 Encounter for prophylactic measures, unspecified: Secondary | ICD-10-CM | POA: Diagnosis not present

## 2020-06-22 DIAGNOSIS — I10 Essential (primary) hypertension: Secondary | ICD-10-CM | POA: Diagnosis not present

## 2020-06-22 DIAGNOSIS — J069 Acute upper respiratory infection, unspecified: Secondary | ICD-10-CM | POA: Diagnosis not present

## 2020-06-23 DIAGNOSIS — I10 Essential (primary) hypertension: Secondary | ICD-10-CM | POA: Diagnosis not present

## 2020-07-14 DIAGNOSIS — R110 Nausea: Secondary | ICD-10-CM | POA: Diagnosis not present

## 2020-07-14 DIAGNOSIS — N390 Urinary tract infection, site not specified: Secondary | ICD-10-CM | POA: Diagnosis not present

## 2020-07-14 DIAGNOSIS — N183 Chronic kidney disease, stage 3 unspecified: Secondary | ICD-10-CM | POA: Diagnosis not present

## 2020-07-14 DIAGNOSIS — Z299 Encounter for prophylactic measures, unspecified: Secondary | ICD-10-CM | POA: Diagnosis not present

## 2020-07-14 DIAGNOSIS — R350 Frequency of micturition: Secondary | ICD-10-CM | POA: Diagnosis not present

## 2020-07-14 DIAGNOSIS — I10 Essential (primary) hypertension: Secondary | ICD-10-CM | POA: Diagnosis not present

## 2020-07-16 DIAGNOSIS — N183 Chronic kidney disease, stage 3 unspecified: Secondary | ICD-10-CM | POA: Diagnosis not present

## 2020-07-16 DIAGNOSIS — I10 Essential (primary) hypertension: Secondary | ICD-10-CM | POA: Diagnosis not present

## 2020-07-16 DIAGNOSIS — J309 Allergic rhinitis, unspecified: Secondary | ICD-10-CM | POA: Diagnosis not present

## 2020-07-16 DIAGNOSIS — Z299 Encounter for prophylactic measures, unspecified: Secondary | ICD-10-CM | POA: Diagnosis not present

## 2020-07-16 DIAGNOSIS — E039 Hypothyroidism, unspecified: Secondary | ICD-10-CM | POA: Diagnosis not present

## 2020-07-20 DIAGNOSIS — Z6824 Body mass index (BMI) 24.0-24.9, adult: Secondary | ICD-10-CM | POA: Diagnosis not present

## 2020-07-20 DIAGNOSIS — R5383 Other fatigue: Secondary | ICD-10-CM | POA: Diagnosis not present

## 2020-07-20 DIAGNOSIS — Z7189 Other specified counseling: Secondary | ICD-10-CM | POA: Diagnosis not present

## 2020-07-20 DIAGNOSIS — Z299 Encounter for prophylactic measures, unspecified: Secondary | ICD-10-CM | POA: Diagnosis not present

## 2020-07-20 DIAGNOSIS — Z1331 Encounter for screening for depression: Secondary | ICD-10-CM | POA: Diagnosis not present

## 2020-07-20 DIAGNOSIS — Z1339 Encounter for screening examination for other mental health and behavioral disorders: Secondary | ICD-10-CM | POA: Diagnosis not present

## 2020-07-20 DIAGNOSIS — E7800 Pure hypercholesterolemia, unspecified: Secondary | ICD-10-CM | POA: Diagnosis not present

## 2020-07-20 DIAGNOSIS — Z0000 Encounter for general adult medical examination without abnormal findings: Secondary | ICD-10-CM | POA: Diagnosis not present

## 2020-07-20 DIAGNOSIS — I10 Essential (primary) hypertension: Secondary | ICD-10-CM | POA: Diagnosis not present

## 2020-07-21 DIAGNOSIS — E876 Hypokalemia: Secondary | ICD-10-CM | POA: Diagnosis not present

## 2020-07-21 DIAGNOSIS — I10 Essential (primary) hypertension: Secondary | ICD-10-CM | POA: Diagnosis not present

## 2020-07-21 DIAGNOSIS — I160 Hypertensive urgency: Secondary | ICD-10-CM | POA: Diagnosis not present

## 2020-07-21 DIAGNOSIS — Z885 Allergy status to narcotic agent status: Secondary | ICD-10-CM | POA: Diagnosis not present

## 2020-07-21 DIAGNOSIS — N289 Disorder of kidney and ureter, unspecified: Secondary | ICD-10-CM | POA: Diagnosis not present

## 2020-07-21 DIAGNOSIS — Z888 Allergy status to other drugs, medicaments and biological substances status: Secondary | ICD-10-CM | POA: Diagnosis not present

## 2020-07-23 DIAGNOSIS — I10 Essential (primary) hypertension: Secondary | ICD-10-CM | POA: Diagnosis not present

## 2020-07-30 DIAGNOSIS — Z299 Encounter for prophylactic measures, unspecified: Secondary | ICD-10-CM | POA: Diagnosis not present

## 2020-07-30 DIAGNOSIS — B370 Candidal stomatitis: Secondary | ICD-10-CM | POA: Diagnosis not present

## 2020-07-30 DIAGNOSIS — I10 Essential (primary) hypertension: Secondary | ICD-10-CM | POA: Diagnosis not present

## 2020-07-30 DIAGNOSIS — J302 Other seasonal allergic rhinitis: Secondary | ICD-10-CM | POA: Diagnosis not present

## 2020-08-04 DIAGNOSIS — Z23 Encounter for immunization: Secondary | ICD-10-CM | POA: Diagnosis not present

## 2020-08-20 DIAGNOSIS — E039 Hypothyroidism, unspecified: Secondary | ICD-10-CM | POA: Diagnosis not present

## 2020-08-20 DIAGNOSIS — I10 Essential (primary) hypertension: Secondary | ICD-10-CM | POA: Diagnosis not present

## 2020-08-20 DIAGNOSIS — R5383 Other fatigue: Secondary | ICD-10-CM | POA: Diagnosis not present

## 2020-08-20 DIAGNOSIS — Z299 Encounter for prophylactic measures, unspecified: Secondary | ICD-10-CM | POA: Diagnosis not present

## 2020-08-20 DIAGNOSIS — Z6825 Body mass index (BMI) 25.0-25.9, adult: Secondary | ICD-10-CM | POA: Diagnosis not present

## 2020-08-20 DIAGNOSIS — Z79899 Other long term (current) drug therapy: Secondary | ICD-10-CM | POA: Diagnosis not present

## 2020-08-20 DIAGNOSIS — Z789 Other specified health status: Secondary | ICD-10-CM | POA: Diagnosis not present

## 2020-08-20 DIAGNOSIS — E7800 Pure hypercholesterolemia, unspecified: Secondary | ICD-10-CM | POA: Diagnosis not present

## 2020-08-20 DIAGNOSIS — E559 Vitamin D deficiency, unspecified: Secondary | ICD-10-CM | POA: Diagnosis not present

## 2020-08-20 DIAGNOSIS — N183 Chronic kidney disease, stage 3 unspecified: Secondary | ICD-10-CM | POA: Diagnosis not present

## 2020-08-24 DIAGNOSIS — I10 Essential (primary) hypertension: Secondary | ICD-10-CM | POA: Diagnosis not present

## 2020-09-02 DIAGNOSIS — M797 Fibromyalgia: Secondary | ICD-10-CM | POA: Diagnosis not present

## 2020-09-02 DIAGNOSIS — F418 Other specified anxiety disorders: Secondary | ICD-10-CM | POA: Diagnosis not present

## 2020-09-02 DIAGNOSIS — M13862 Other specified arthritis, left knee: Secondary | ICD-10-CM | POA: Diagnosis not present

## 2020-09-02 DIAGNOSIS — Z79899 Other long term (current) drug therapy: Secondary | ICD-10-CM | POA: Diagnosis not present

## 2020-09-02 DIAGNOSIS — G894 Chronic pain syndrome: Secondary | ICD-10-CM | POA: Diagnosis not present

## 2020-09-02 DIAGNOSIS — F329 Major depressive disorder, single episode, unspecified: Secondary | ICD-10-CM | POA: Diagnosis not present

## 2020-09-07 DIAGNOSIS — N170 Acute kidney failure with tubular necrosis: Secondary | ICD-10-CM | POA: Diagnosis not present

## 2020-09-07 DIAGNOSIS — D638 Anemia in other chronic diseases classified elsewhere: Secondary | ICD-10-CM | POA: Diagnosis not present

## 2020-09-07 DIAGNOSIS — E876 Hypokalemia: Secondary | ICD-10-CM | POA: Diagnosis not present

## 2020-09-07 DIAGNOSIS — E559 Vitamin D deficiency, unspecified: Secondary | ICD-10-CM | POA: Diagnosis not present

## 2020-09-07 DIAGNOSIS — I129 Hypertensive chronic kidney disease with stage 1 through stage 4 chronic kidney disease, or unspecified chronic kidney disease: Secondary | ICD-10-CM | POA: Diagnosis not present

## 2020-09-07 DIAGNOSIS — N1831 Chronic kidney disease, stage 3a: Secondary | ICD-10-CM | POA: Diagnosis not present

## 2020-09-17 ENCOUNTER — Other Ambulatory Visit (HOSPITAL_COMMUNITY): Payer: Self-pay | Admitting: Nephrology

## 2020-09-17 ENCOUNTER — Other Ambulatory Visit: Payer: Self-pay | Admitting: Nephrology

## 2020-09-17 DIAGNOSIS — N170 Acute kidney failure with tubular necrosis: Secondary | ICD-10-CM

## 2020-09-17 DIAGNOSIS — N1832 Chronic kidney disease, stage 3b: Secondary | ICD-10-CM | POA: Diagnosis not present

## 2020-09-17 DIAGNOSIS — D638 Anemia in other chronic diseases classified elsewhere: Secondary | ICD-10-CM | POA: Diagnosis not present

## 2020-09-17 DIAGNOSIS — I129 Hypertensive chronic kidney disease with stage 1 through stage 4 chronic kidney disease, or unspecified chronic kidney disease: Secondary | ICD-10-CM | POA: Diagnosis not present

## 2020-09-17 DIAGNOSIS — E876 Hypokalemia: Secondary | ICD-10-CM

## 2020-09-17 DIAGNOSIS — E559 Vitamin D deficiency, unspecified: Secondary | ICD-10-CM | POA: Diagnosis not present

## 2020-09-24 ENCOUNTER — Ambulatory Visit (HOSPITAL_COMMUNITY)
Admission: RE | Admit: 2020-09-24 | Discharge: 2020-09-24 | Disposition: A | Discharge status: Home / Self Care | Payer: Medicare HMO | Source: Ambulatory Visit | Attending: Nephrology | Admitting: Nephrology

## 2020-09-24 ENCOUNTER — Other Ambulatory Visit: Payer: Self-pay

## 2020-09-24 DIAGNOSIS — I129 Hypertensive chronic kidney disease with stage 1 through stage 4 chronic kidney disease, or unspecified chronic kidney disease: Secondary | ICD-10-CM | POA: Insufficient documentation

## 2020-09-24 DIAGNOSIS — N1832 Chronic kidney disease, stage 3b: Secondary | ICD-10-CM | POA: Insufficient documentation

## 2020-09-24 DIAGNOSIS — D638 Anemia in other chronic diseases classified elsewhere: Secondary | ICD-10-CM | POA: Insufficient documentation

## 2020-09-24 DIAGNOSIS — N170 Acute kidney failure with tubular necrosis: Secondary | ICD-10-CM | POA: Insufficient documentation

## 2020-09-24 DIAGNOSIS — E876 Hypokalemia: Secondary | ICD-10-CM | POA: Diagnosis not present

## 2020-09-24 DIAGNOSIS — N200 Calculus of kidney: Secondary | ICD-10-CM | POA: Diagnosis not present

## 2020-09-29 DIAGNOSIS — M1712 Unilateral primary osteoarthritis, left knee: Secondary | ICD-10-CM | POA: Diagnosis not present

## 2020-09-29 DIAGNOSIS — E039 Hypothyroidism, unspecified: Secondary | ICD-10-CM | POA: Diagnosis not present

## 2020-09-29 DIAGNOSIS — Z299 Encounter for prophylactic measures, unspecified: Secondary | ICD-10-CM | POA: Diagnosis not present

## 2020-09-29 DIAGNOSIS — Z789 Other specified health status: Secondary | ICD-10-CM | POA: Diagnosis not present

## 2020-09-29 DIAGNOSIS — N183 Chronic kidney disease, stage 3 unspecified: Secondary | ICD-10-CM | POA: Diagnosis not present

## 2020-09-29 DIAGNOSIS — I10 Essential (primary) hypertension: Secondary | ICD-10-CM | POA: Diagnosis not present

## 2020-10-05 ENCOUNTER — Encounter: Payer: Self-pay | Admitting: Urology

## 2020-10-05 ENCOUNTER — Other Ambulatory Visit: Payer: Self-pay

## 2020-10-05 ENCOUNTER — Ambulatory Visit (INDEPENDENT_AMBULATORY_CARE_PROVIDER_SITE_OTHER): Payer: Medicare HMO | Admitting: Urology

## 2020-10-05 VITALS — BP 91/55 | HR 76 | Temp 98.8°F | Ht 63.0 in | Wt 146.0 lb

## 2020-10-05 DIAGNOSIS — E876 Hypokalemia: Secondary | ICD-10-CM | POA: Diagnosis not present

## 2020-10-05 DIAGNOSIS — N170 Acute kidney failure with tubular necrosis: Secondary | ICD-10-CM | POA: Diagnosis not present

## 2020-10-05 DIAGNOSIS — I129 Hypertensive chronic kidney disease with stage 1 through stage 4 chronic kidney disease, or unspecified chronic kidney disease: Secondary | ICD-10-CM | POA: Diagnosis not present

## 2020-10-05 DIAGNOSIS — N1832 Chronic kidney disease, stage 3b: Secondary | ICD-10-CM | POA: Diagnosis not present

## 2020-10-05 DIAGNOSIS — N320 Bladder-neck obstruction: Secondary | ICD-10-CM

## 2020-10-05 DIAGNOSIS — E559 Vitamin D deficiency, unspecified: Secondary | ICD-10-CM | POA: Diagnosis not present

## 2020-10-05 DIAGNOSIS — D638 Anemia in other chronic diseases classified elsewhere: Secondary | ICD-10-CM | POA: Diagnosis not present

## 2020-10-05 LAB — BLADDER SCAN AMB NON-IMAGING: Scan Result: 3

## 2020-10-05 MED ORDER — TAMSULOSIN HCL 0.4 MG PO CAPS: 0.4000 mg | ORAL_CAPSULE | Freq: Every day | ORAL | 11 refills | Status: DC

## 2020-10-05 NOTE — Patient Instructions (Signed)
Acute Urinary Retention, Female  Acute urinary retention is when a person cannot pee (urinate) at all, or can only pee a little. This can come on all of a sudden. If it is not treated, it can lead to kidney problems or other serious problems. What are the causes?  A problem with the tube that drains the bladder (urethra).  Problems with the nerves in the bladder.  The organs in the area between your hip bones (pelvis) slipping out of place (prolapse).  Tumors.  The birth of a baby through the vagina.  An infection.  Having trouble pooping (constipation).  Certain medicines. What increases the risk? Women over age 50 are more at risk. Other conditions also can increase risk. These include:  Diseases, such as multiple sclerosis.  Injury to the spinal cord.  Diabetes.  A condition that affects the way the brain works, such as dementia.  Holding back urine due to trauma or because you do not want to use the bathroom.  History of not being able to pee or peeing too little.  Having had surgery in the area between your hip bones. What are the signs or symptoms?  Trouble peeing.  Pain in the lower belly. How is this treated? Treatment for this condition may include:  Medicines.  Placing a thin, germ-free tube (catheter) into the bladder to drain pee out of the body.  Therapy to treat mental health conditions.  Treatment for conditions that may cause this. If needed, you may be treated in the hospital for kidney problems or to manage other problems. Follow these instructions at home: Medicines  Take over-the-counter and prescription medicines only as told by your doctor. Ask your doctor what medicines you should stay away from.  If you were given an antibiotic medicine, take it as told by your doctor. Do not stop taking it even if you start to feel better. General instructions  Do not smoke or use any products that contain nicotine or tobacco. If you need help  quitting, ask your doctor.  Drink enough fluid to keep your pee pale yellow.  If you were sent home with a tube that drains the bladder, take care of it as told by your doctor.  Watch for changes in your symptoms. Tell your doctor about them.  If told, keep track of changes in your blood pressure at home. Tell your doctor about them.  Keep all follow-up visits. Contact a doctor if:  You have spasms in your bladder that you cannot stop.  You leak pee when you have spasms. Get help right away if:  You have chills or a fever.  You have blood in your pee.  You have a tube that drains pee from the bladder and these things happen: ? The tube stops draining pee. ? The tube falls out. Summary  Acute urinary retention is when you cannot pee at all or you pee too little.  If this is not treated, it can cause kidney problems or other serious problems.  If you were sent home with a tube (catheter) that drains pee from the bladder, take care of it as told by your doctor.  Watch for changes in your symptoms. Tell your doctor about them. This information is not intended to replace advice given to you by your health care provider. Make sure you discuss any questions you have with your health care provider. Document Revised: 04/01/2020 Document Reviewed: 04/01/2020 Elsevier Patient Education  2021 [employer] Inc.  

## 2020-10-05 NOTE — Progress Notes (Signed)
10/05/2020 2:46 PM   Christine Burns 01-Oct-1945 [id number]  Referring provider: Randa Lynn, MD [phone] [place],  [place] [address]  Difficulty urinating  HPI: Christine Burns is a 74yo here for evaluation of difficulty urinating. Starting 2 weeks ago she developed a weaker urinary stream with associated straining to urinate. She has urinary hesitancy, nocturia 3-4x, urinary frequnecy every 1-2 hours. No urgency or urge incontinence. No issues with constipation. No dysuria or hematuria. No numbness/tingling in fingers/toes. She has CKD3.    PMH: Past Medical History:  Diagnosis Date  . Anxiety   . Arthritis    back & knees & fingers   . Chronic kidney disease    due to increased use of NSAIDS in the past, appt. with [facility] x1, but now is managed by PCP- Nyland    . Depression   . Fibromyalgia    tremors  . Hypertension   . Hypothyroidism     Surgical History: Past Surgical History:  Procedure Laterality Date  . ABDOMINAL HYSTERECTOMY    . APPENDECTOMY    . BACK SURGERY     cerv. fusion, low back surgery for stenosis   . BREAST REDUCTION SURGERY Bilateral 1990's    Home Medications:  Allergies as of 10/05/2020      Reactions   Fentanyl Hypertension   Ibuprofen Other (See Comments)   Kidney damage   Zolpidem Nausea And Vomiting, Other (See Comments)   Other reaction(s): Confusion (intolerance) "made me go crazy" "made me go crazy" "made me go crazy" "made me go crazy" Other Reaction: Other reaction   Cyclobenzaprine Other (See Comments)   Other reaction(s): Confusion (intolerance), Hallucinations, Other (See Comments) States goes crazy Cognitive changes. States goes crazy States goes crazy States goes crazy Cognitive changes.   Diazepam Other (See Comments)     SLEEP WALKING, hallucinations   Morphine And Related Rash   Other reaction(s): Other (see comments) Headache Headache Other Reaction: Other reaction   Zolpidem Tartrate Nausea And  Vomiting   Metaxalone    Meperidine Nausea Only   Morphine Rash      Medication List       Accurate as of October 05, 2020  2:46 PM. If you have any questions, ask your nurse or doctor.        STOP taking these medications   pregabalin 25 MG capsule Commonly known as: LYRICA Stopped by: Wilkie Aye, MD     TAKE these medications   amLODipine 5 MG tablet Commonly known as: NORVASC   amphetamine-dextroamphetamine 30 MG 24 hr capsule Commonly known as: ADDERALL XR Take 30 mg by mouth daily before breakfast.   baclofen 10 MG tablet Commonly known as: LIORESAL Take 0.5 tablets (5 mg total) by mouth 3 (three) times daily.   Cholecalciferol 25 MCG (1000 UT) capsule Take by mouth.   DULoxetine 60 MG capsule Commonly known as: CYMBALTA Take 60 mg by mouth 2 (two) times daily.   DULoxetine 30 MG capsule Commonly known as: CYMBALTA Take 1 capsule (30 mg total) by mouth 2 (two) times daily.   fluticasone 50 MCG/ACT nasal spray Commonly known as: FLONASE   HYDROcodone-acetaminophen 10-325 MG tablet Commonly known as: NORCO   levothyroxine 100 MCG tablet Commonly known as: SYNTHROID Take 100 mcg by mouth daily.   loratadine 10 MG tablet Commonly known as: CLARITIN Take 10 mg by mouth daily as needed for allergies.   methadone 10 MG tablet Commonly known as: DOLOPHINE   methylphenidate 5  MG tablet Commonly known as: RITALIN   mometasone 50 MCG/ACT nasal spray Commonly known as: NASONEX uses 1 spray in each nostril at bedtime   nystatin 100000 UNIT/ML suspension Commonly known as: MYCOSTATIN   oxyCODONE-acetaminophen 5-325 MG tablet Commonly known as: PERCOCET/ROXICET Take 1-2 tablets by mouth every 4 (four) hours as needed for moderate pain.   propranolol 80 MG tablet Commonly known as: INDERAL Take by mouth. What changed: Another medication with the same name was removed. Continue taking this medication, and follow the directions you see here. Changed  by: Wilkie Aye, MD   traZODone 100 MG tablet Commonly known as: DESYREL Take 50 mg by mouth at bedtime.       Allergies:  Allergies  Allergen Reactions  . Fentanyl Hypertension  . Ibuprofen Other (See Comments)    Kidney damage  . Zolpidem Nausea And Vomiting and Other (See Comments)    Other reaction(s): Confusion (intolerance) "made me go crazy" "made me go crazy" "made me go crazy" "made me go crazy" Other Reaction: Other reaction   . Cyclobenzaprine Other (See Comments)    Other reaction(s): Confusion (intolerance), Hallucinations, Other (See Comments) States goes crazy Cognitive changes. States goes crazy States goes crazy States goes crazy Cognitive changes.   . Diazepam Other (See Comments)      SLEEP WALKING, hallucinations  . Morphine And Related Rash    Other reaction(s): Other (see comments) Headache Headache Other Reaction: Other reaction   . Zolpidem Tartrate Nausea And Vomiting  . Metaxalone   . Meperidine Nausea Only  . Morphine Rash    Family History: No family history on file.  Social History:  reports that she has never smoked. She does not have any smokeless tobacco history on file. She reports that she does not drink alcohol and does not use drugs.  ROS: All other review of systems were reviewed and are negative except what is noted above in HPI  Physical Exam: BP (!) 91/55   Pulse 76   Temp 98.8 F (37.1 C)   Ht 5\' 3"  (1.6 m)   Wt 146 lb (66.2 kg)   BMI 25.86 kg/m   Constitutional:  Alert and oriented, No acute distress. HEENT: [place] AT, moist mucus membranes.  Trachea midline, no masses. Cardiovascular: No clubbing, cyanosis, or edema. Respiratory: Normal respiratory effort, no increased work of breathing. GI: Abdomen is soft, nontender, nondistended, no abdominal masses GU: No CVA tenderness.  Lymph: No cervical or inguinal lymphadenopathy. Skin: No rashes, bruises or suspicious lesions. Neurologic: Grossly intact, no  focal deficits, moving all 4 extremities. Psychiatric: Normal mood and affect.  Laboratory Data: Lab Results  Component Value Date   WBC 8.5 10/17/2015   HGB 7.6 (L) 10/17/2015   HCT 23.8 (L) 10/17/2015   MCV 85.3 10/17/2015   PLT 264 10/17/2015    Lab Results  Component Value Date   CREATININE 1.39 (H) 10/17/2015    No results found for: PSA  No results found for: TESTOSTERONE  No results found for: HGBA1C  Urinalysis    Component Value Date/Time   COLORURINE YELLOW 10/18/2015 1816   APPEARANCEUR CLEAR 10/18/2015 1816   LABSPEC 1.010 10/18/2015 1816   PHURINE 5.5 10/18/2015 1816   GLUCOSEU NEGATIVE 10/18/2015 1816   HGBUR NEGATIVE 10/18/2015 1816   BILIRUBINUR NEGATIVE 10/18/2015 1816   KETONESUR NEGATIVE 10/18/2015 1816   PROTEINUR 30 (A) 10/18/2015 1816   NITRITE NEGATIVE 10/18/2015 1816   LEUKOCYTESUR NEGATIVE 10/18/2015 1816    Lab Results  Component  Value Date   BACTERIA RARE (A) 10/18/2015    Pertinent Imaging:  No results found for this or any previous visit.  No results found for this or any previous visit.  No results found for this or any previous visit.  No results found for this or any previous visit.  Results for orders placed during the hospital encounter of 09/24/20  US RENAL  Narrative CLINICAL DATA:  CKD stage 3  EXAM: RENAL / URINARY TRACT ULTRASOUND COMPLETE  COMPARISON:  Renal ultrasound 05/07/2020  FINDINGS: Right Kidney:  Renal measurements: 8.9 x 4.9 x 4.7 cm = volume: 108 is mL. Echogenicity within normal limits. No mass or hydronephrosis visualized.  Left Kidney:  Renal measurements: 8.6 x 4.8 x 4.0 cm = volume: 86 mL. Echogenicity within normal limits. No mass or hydronephrosis visualized. There is a 7 mm shadowing calculus in the superior pole.  Bladder:  Appears normal for degree of bladder distention. 48 cc prevoid and complete emptying postvoid.  Other:  None.  IMPRESSION: 1. 7 mm nonobstructing  calculus in the left kidney. No hydronephrosis.  2.  Unremarkable appearance of the right kidney.  3.  Complete emptying of the urinary bladder postvoid.   Electronically Signed By: Emmaline Kluver M.D. On: 09/24/2020 16:09  No results found for this or any previous visit.  No results found for this or any previous visit.  No results found for this or any previous visit.   Assessment & Plan:    1. Bladder obstruction -We will trial flomax 0.4mg . RTC 1 month with flow PVR - Urinalysis, Routine w reflex microscopic   No follow-ups on file.  Wilkie Aye, MD  [facility] [place]

## 2020-10-05 NOTE — Progress Notes (Signed)
Bladder Scan Patient can void: 3 ml Performed By: Bridgette Habermann, lpn  Urological Symptom Review  Patient is experiencing the following symptoms: Get up at night to urinate Injury to kidneys/bladder  Have to strain to urinate   Review of Systems  Gastrointestinal (upper)  : Negative for upper GI symptoms  Gastrointestinal (lower) : Negative for lower GI symptoms  Constitutional : Fatigue  Skin: Itching  Eyes: Negative for eye symptoms  Ear/Nose/Throat : Negative for Ear/Nose/Throat symptoms  Hematologic/Lymphatic: Negative for Hematologic/Lymphatic symptoms  Cardiovascular : Negative for cardiovascular symptoms  Respiratory : Negative for respiratory symptoms  Endocrine: Excessive thirst  Musculoskeletal: Back pain Joint pain  Neurological: Negative for neurological symptoms  Psychologic: Negative for psychiatric symptoms

## 2020-10-06 LAB — MICROSCOPIC EXAMINATION
RBC: NONE SEEN /hpf (ref 0–2)
Renal Epithel, UA: NONE SEEN /hpf

## 2020-10-06 LAB — URINALYSIS, ROUTINE W REFLEX MICROSCOPIC
Bilirubin, UA: NEGATIVE
Glucose, UA: NEGATIVE
Leukocytes,UA: NEGATIVE
Nitrite, UA: NEGATIVE
RBC, UA: NEGATIVE
Specific Gravity, UA: 1.025 (ref 1.005–1.030)
Urobilinogen, Ur: 0.2 mg/dL (ref 0.2–1.0)
pH, UA: 5.5 (ref 5.0–7.5)

## 2020-10-07 ENCOUNTER — Ambulatory Visit: Payer: Medicare HMO | Admitting: Urology

## 2020-10-07 DIAGNOSIS — I952 Hypotension due to drugs: Secondary | ICD-10-CM | POA: Diagnosis not present

## 2020-10-07 DIAGNOSIS — N2581 Secondary hyperparathyroidism of renal origin: Secondary | ICD-10-CM | POA: Diagnosis not present

## 2020-10-07 DIAGNOSIS — E876 Hypokalemia: Secondary | ICD-10-CM | POA: Diagnosis not present

## 2020-10-07 DIAGNOSIS — R809 Proteinuria, unspecified: Secondary | ICD-10-CM | POA: Diagnosis not present

## 2020-10-07 DIAGNOSIS — N200 Calculus of kidney: Secondary | ICD-10-CM | POA: Diagnosis not present

## 2020-10-07 DIAGNOSIS — D638 Anemia in other chronic diseases classified elsewhere: Secondary | ICD-10-CM | POA: Diagnosis not present

## 2020-10-07 DIAGNOSIS — N170 Acute kidney failure with tubular necrosis: Secondary | ICD-10-CM | POA: Diagnosis not present

## 2020-10-07 DIAGNOSIS — N1832 Chronic kidney disease, stage 3b: Secondary | ICD-10-CM | POA: Diagnosis not present

## 2020-10-09 DIAGNOSIS — E2839 Other primary ovarian failure: Secondary | ICD-10-CM | POA: Diagnosis not present

## 2020-10-22 DIAGNOSIS — I10 Essential (primary) hypertension: Secondary | ICD-10-CM | POA: Diagnosis not present

## 2020-10-22 DIAGNOSIS — E559 Vitamin D deficiency, unspecified: Secondary | ICD-10-CM | POA: Diagnosis not present

## 2020-10-22 DIAGNOSIS — N183 Chronic kidney disease, stage 3 unspecified: Secondary | ICD-10-CM | POA: Diagnosis not present

## 2020-10-22 DIAGNOSIS — Z299 Encounter for prophylactic measures, unspecified: Secondary | ICD-10-CM | POA: Diagnosis not present

## 2020-10-22 DIAGNOSIS — M810 Age-related osteoporosis without current pathological fracture: Secondary | ICD-10-CM | POA: Diagnosis not present

## 2020-10-22 DIAGNOSIS — M25562 Pain in left knee: Secondary | ICD-10-CM | POA: Diagnosis not present

## 2020-11-02 DIAGNOSIS — G894 Chronic pain syndrome: Secondary | ICD-10-CM | POA: Diagnosis not present

## 2020-11-02 DIAGNOSIS — Z79899 Other long term (current) drug therapy: Secondary | ICD-10-CM | POA: Diagnosis not present

## 2020-11-02 DIAGNOSIS — M797 Fibromyalgia: Secondary | ICD-10-CM | POA: Diagnosis not present

## 2020-11-02 DIAGNOSIS — F329 Major depressive disorder, single episode, unspecified: Secondary | ICD-10-CM | POA: Diagnosis not present

## 2020-11-02 DIAGNOSIS — M13862 Other specified arthritis, left knee: Secondary | ICD-10-CM | POA: Diagnosis not present

## 2020-11-02 DIAGNOSIS — F418 Other specified anxiety disorders: Secondary | ICD-10-CM | POA: Diagnosis not present

## 2020-11-10 DIAGNOSIS — M25561 Pain in right knee: Secondary | ICD-10-CM | POA: Diagnosis not present

## 2020-11-10 DIAGNOSIS — M25462 Effusion, left knee: Secondary | ICD-10-CM | POA: Diagnosis not present

## 2020-11-10 DIAGNOSIS — M25562 Pain in left knee: Secondary | ICD-10-CM | POA: Diagnosis not present

## 2020-11-10 DIAGNOSIS — M170 Bilateral primary osteoarthritis of knee: Secondary | ICD-10-CM | POA: Diagnosis not present

## 2020-11-25 ENCOUNTER — Ambulatory Visit: Payer: Medicare HMO | Admitting: Urology

## 2020-11-27 DIAGNOSIS — M1712 Unilateral primary osteoarthritis, left knee: Secondary | ICD-10-CM | POA: Diagnosis not present

## 2020-11-27 DIAGNOSIS — I10 Essential (primary) hypertension: Secondary | ICD-10-CM | POA: Diagnosis not present

## 2020-11-27 DIAGNOSIS — E039 Hypothyroidism, unspecified: Secondary | ICD-10-CM | POA: Diagnosis not present

## 2020-11-27 DIAGNOSIS — Z299 Encounter for prophylactic measures, unspecified: Secondary | ICD-10-CM | POA: Diagnosis not present

## 2020-11-27 DIAGNOSIS — Z789 Other specified health status: Secondary | ICD-10-CM | POA: Diagnosis not present

## 2020-12-03 DIAGNOSIS — E039 Hypothyroidism, unspecified: Secondary | ICD-10-CM | POA: Diagnosis not present

## 2020-12-03 DIAGNOSIS — I10 Essential (primary) hypertension: Secondary | ICD-10-CM | POA: Diagnosis not present

## 2020-12-03 DIAGNOSIS — Z299 Encounter for prophylactic measures, unspecified: Secondary | ICD-10-CM | POA: Diagnosis not present

## 2020-12-03 DIAGNOSIS — J309 Allergic rhinitis, unspecified: Secondary | ICD-10-CM | POA: Diagnosis not present

## 2020-12-03 DIAGNOSIS — N183 Chronic kidney disease, stage 3 unspecified: Secondary | ICD-10-CM | POA: Diagnosis not present

## 2020-12-08 DIAGNOSIS — J309 Allergic rhinitis, unspecified: Secondary | ICD-10-CM | POA: Diagnosis not present

## 2020-12-28 DIAGNOSIS — D638 Anemia in other chronic diseases classified elsewhere: Secondary | ICD-10-CM | POA: Diagnosis not present

## 2020-12-28 DIAGNOSIS — R809 Proteinuria, unspecified: Secondary | ICD-10-CM | POA: Diagnosis not present

## 2020-12-28 DIAGNOSIS — N170 Acute kidney failure with tubular necrosis: Secondary | ICD-10-CM | POA: Diagnosis not present

## 2020-12-28 DIAGNOSIS — N1832 Chronic kidney disease, stage 3b: Secondary | ICD-10-CM | POA: Diagnosis not present

## 2020-12-28 DIAGNOSIS — I129 Hypertensive chronic kidney disease with stage 1 through stage 4 chronic kidney disease, or unspecified chronic kidney disease: Secondary | ICD-10-CM | POA: Diagnosis not present

## 2020-12-30 DIAGNOSIS — E559 Vitamin D deficiency, unspecified: Secondary | ICD-10-CM | POA: Diagnosis not present

## 2020-12-30 DIAGNOSIS — R809 Proteinuria, unspecified: Secondary | ICD-10-CM | POA: Diagnosis not present

## 2020-12-30 DIAGNOSIS — I129 Hypertensive chronic kidney disease with stage 1 through stage 4 chronic kidney disease, or unspecified chronic kidney disease: Secondary | ICD-10-CM | POA: Diagnosis not present

## 2020-12-30 DIAGNOSIS — N1832 Chronic kidney disease, stage 3b: Secondary | ICD-10-CM | POA: Diagnosis not present

## 2020-12-30 DIAGNOSIS — N200 Calculus of kidney: Secondary | ICD-10-CM | POA: Diagnosis not present

## 2021-01-18 ENCOUNTER — Ambulatory Visit: Payer: Medicare HMO | Admitting: Urology

## 2021-02-01 DIAGNOSIS — M13862 Other specified arthritis, left knee: Secondary | ICD-10-CM | POA: Diagnosis not present

## 2021-02-01 DIAGNOSIS — Z5181 Encounter for therapeutic drug level monitoring: Secondary | ICD-10-CM | POA: Diagnosis not present

## 2021-02-01 DIAGNOSIS — Z79899 Other long term (current) drug therapy: Secondary | ICD-10-CM | POA: Diagnosis not present

## 2021-02-01 DIAGNOSIS — F329 Major depressive disorder, single episode, unspecified: Secondary | ICD-10-CM | POA: Diagnosis not present

## 2021-02-01 DIAGNOSIS — M797 Fibromyalgia: Secondary | ICD-10-CM | POA: Diagnosis not present

## 2021-02-01 DIAGNOSIS — F418 Other specified anxiety disorders: Secondary | ICD-10-CM | POA: Diagnosis not present

## 2021-02-01 DIAGNOSIS — G894 Chronic pain syndrome: Secondary | ICD-10-CM | POA: Diagnosis not present

## 2021-02-05 DIAGNOSIS — I10 Essential (primary) hypertension: Secondary | ICD-10-CM | POA: Diagnosis not present

## 2021-02-05 DIAGNOSIS — M1712 Unilateral primary osteoarthritis, left knee: Secondary | ICD-10-CM | POA: Diagnosis not present

## 2021-02-05 DIAGNOSIS — Z299 Encounter for prophylactic measures, unspecified: Secondary | ICD-10-CM | POA: Diagnosis not present

## 2021-02-18 DIAGNOSIS — I10 Essential (primary) hypertension: Secondary | ICD-10-CM | POA: Diagnosis not present

## 2021-02-18 DIAGNOSIS — Z299 Encounter for prophylactic measures, unspecified: Secondary | ICD-10-CM | POA: Diagnosis not present

## 2021-02-18 DIAGNOSIS — M1710 Unilateral primary osteoarthritis, unspecified knee: Secondary | ICD-10-CM | POA: Diagnosis not present

## 2021-02-18 DIAGNOSIS — N183 Chronic kidney disease, stage 3 unspecified: Secondary | ICD-10-CM | POA: Diagnosis not present

## 2021-02-18 DIAGNOSIS — J309 Allergic rhinitis, unspecified: Secondary | ICD-10-CM | POA: Diagnosis not present

## 2021-02-25 DIAGNOSIS — M1712 Unilateral primary osteoarthritis, left knee: Secondary | ICD-10-CM | POA: Diagnosis not present

## 2021-03-02 DIAGNOSIS — I129 Hypertensive chronic kidney disease with stage 1 through stage 4 chronic kidney disease, or unspecified chronic kidney disease: Secondary | ICD-10-CM | POA: Diagnosis not present

## 2021-03-02 DIAGNOSIS — N200 Calculus of kidney: Secondary | ICD-10-CM | POA: Diagnosis not present

## 2021-03-02 DIAGNOSIS — R809 Proteinuria, unspecified: Secondary | ICD-10-CM | POA: Diagnosis not present

## 2021-03-02 DIAGNOSIS — N1832 Chronic kidney disease, stage 3b: Secondary | ICD-10-CM | POA: Diagnosis not present

## 2021-03-03 DIAGNOSIS — Z299 Encounter for prophylactic measures, unspecified: Secondary | ICD-10-CM | POA: Diagnosis not present

## 2021-03-03 DIAGNOSIS — M1710 Unilateral primary osteoarthritis, unspecified knee: Secondary | ICD-10-CM | POA: Diagnosis not present

## 2021-03-03 DIAGNOSIS — Z713 Dietary counseling and surveillance: Secondary | ICD-10-CM | POA: Diagnosis not present

## 2021-03-03 DIAGNOSIS — R030 Elevated blood-pressure reading, without diagnosis of hypertension: Secondary | ICD-10-CM | POA: Diagnosis not present

## 2021-03-03 DIAGNOSIS — Z6826 Body mass index (BMI) 26.0-26.9, adult: Secondary | ICD-10-CM | POA: Diagnosis not present

## 2021-03-03 DIAGNOSIS — I10 Essential (primary) hypertension: Secondary | ICD-10-CM | POA: Diagnosis not present

## 2021-03-04 DIAGNOSIS — N1832 Chronic kidney disease, stage 3b: Secondary | ICD-10-CM | POA: Diagnosis not present

## 2021-03-04 DIAGNOSIS — R809 Proteinuria, unspecified: Secondary | ICD-10-CM | POA: Diagnosis not present

## 2021-03-04 DIAGNOSIS — E559 Vitamin D deficiency, unspecified: Secondary | ICD-10-CM | POA: Diagnosis not present

## 2021-03-04 DIAGNOSIS — D638 Anemia in other chronic diseases classified elsewhere: Secondary | ICD-10-CM | POA: Diagnosis not present

## 2021-03-04 DIAGNOSIS — I129 Hypertensive chronic kidney disease with stage 1 through stage 4 chronic kidney disease, or unspecified chronic kidney disease: Secondary | ICD-10-CM | POA: Diagnosis not present

## 2021-03-23 DIAGNOSIS — M1712 Unilateral primary osteoarthritis, left knee: Secondary | ICD-10-CM | POA: Diagnosis not present

## 2021-03-26 ENCOUNTER — Ambulatory Visit: Payer: Self-pay | Admitting: Student

## 2021-03-26 NOTE — H&P (Signed)
TOTAL KNEE ADMISSION H&P  Patient is being admitted for left total knee arthroplasty.  Subjective:  Chief Complaint:left knee pain.  HPI: Christine Burns, 75 y.o. female, has a history of pain and functional disability in the left knee due to arthritis and has failed non-surgical conservative treatments for greater than 12 weeks to includeNSAID's and/or analgesics, corticosteriod injections, and activity modification.  Onset of symptoms was gradual, starting 3 years ago with gradually worsening course since that time. The patient noted no past surgery on the left knee(s).  Patient currently rates pain in the left knee(s) at 8 out of 10 with activity. Patient has worsening of pain with activity and weight bearing, pain that interferes with activities of daily living, pain with passive range of motion, and joint swelling.  Patient has evidence of subchondral cysts, subchondral sclerosis, and joint space narrowing by imaging studies. There is no active infection.  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 with [facility] x1, but now is managed by PCP- Nyland     Depression    Fibromyalgia    tremors   Hypertension    Hypothyroidism     Past Surgical History:  Procedure Laterality Date   ABDOMINAL HYSTERECTOMY     APPENDECTOMY     BACK SURGERY     cerv. fusion, low back surgery for stenosis    BREAST REDUCTION SURGERY Bilateral 1990's    Current Outpatient Medications  Medication Sig Dispense Refill Last Dose   amLODipine (NORVASC) 5 MG tablet       amphetamine-dextroamphetamine (ADDERALL XR) 30 MG 24 hr capsule Take 30 mg by mouth daily before  breakfast.       baclofen (LIORESAL) 10 MG tablet Take 0.5 tablets (5 mg total) by mouth 3 (three) times daily. 90 each 0    Cholecalciferol 25 MCG (1000 UT) capsule Take by mouth.      DULoxetine (CYMBALTA) 30 MG capsule Take 1 capsule (30 mg total) by mouth 2 (two) times daily. 60 capsule 3    DULoxetine (CYMBALTA) 60 MG capsule Take 60 mg by mouth 2 (two) times daily.       fluticasone (FLONASE) 50 MCG/ACT nasal spray       HYDROcodone-acetaminophen (NORCO) 10-325 MG tablet       levothyroxine (SYNTHROID, LEVOTHROID) 100 MCG tablet Take 100 mcg by mouth daily.      loratadine (CLARITIN) 10 MG tablet Take 10 mg by mouth daily as needed for allergies.       methadone (DOLOPHINE) 10 MG tablet       methylphenidate (RITALIN) 5 MG tablet       mometasone (NASONEX) 50 MCG/ACT nasal spray uses 1 spray in each nostril at bedtime      nystatin (MYCOSTATIN) 100000 UNIT/ML suspension       oxyCODONE-acetaminophen (PERCOCET/ROXICET) 5-325 MG tablet Take 1-2 tablets by mouth every 4 (four) hours as needed for moderate pain. 90 tablet 0    propranolol (INDERAL) 80 MG tablet Take by mouth.      tamsulosin (FLOMAX) 0.4 MG CAPS capsule Take 1 capsule (0.4 mg  total) by mouth daily after supper. 30 capsule 11    traZODone (DESYREL) 100 MG tablet Take 50 mg by mouth at bedtime.       No current facility-administered medications for this visit.   Allergies  Allergen Reactions   Fentanyl Hypertension   Ibuprofen Other (See Comments)    Kidney damage   Zolpidem Nausea And Vomiting and Other (See Comments)    Other reaction(s): Confusion (intolerance) "made me go crazy" "made me go crazy" "made me go crazy" "made me go crazy" Other Reaction: Other reaction    Cyclobenzaprine Other (See Comments)    Other reaction(s): Confusion (intolerance), Hallucinations, Other (See Comments) States goes crazy Cognitive changes. States goes crazy States goes crazy States goes crazy Cognitive changes.     Diazepam Other (See Comments)      SLEEP WALKING, hallucinations   Morphine And Related Rash    Other reaction(s): Other (see comments) Headache Headache Other Reaction: Other reaction    Zolpidem Tartrate Nausea And Vomiting   Metaxalone    Meperidine Nausea Only   Morphine Rash    Social History   Tobacco Use   Smoking status: Never   Smokeless tobacco: Not on file  Substance Use Topics   Alcohol use: No    No family history on file.   Review of Systems  Constitutional: Negative.   HENT: Negative.    Eyes: Negative.   Respiratory: Negative.    Cardiovascular: Negative.   Gastrointestinal: Negative.   Endocrine: Negative.   Genitourinary: Negative.   Musculoskeletal:  Positive for arthralgias.  Skin: Negative.   Allergic/Immunologic: Negative.   Neurological: Negative.   Hematological: Negative.   Psychiatric/Behavioral: Negative.     Objective:  Physical Exam Eyes:     Pupils: Pupils are equal, round, and reactive to light.  Cardiovascular:     Rate and Rhythm: Normal rate.  Pulmonary:     Effort: Pulmonary effort is normal.  Abdominal:     Palpations: Abdomen is soft.     Tenderness: There is no abdominal tenderness.  Genitourinary:    Comments: Deferred Musculoskeletal:        General: Tenderness present. Normal range of motion.     Cervical back: Normal range of motion.  Skin:    General: Skin is warm.  Neurological:     Mental Status: She is alert and oriented to person, place, and time.  Psychiatric:        Mood and Affect: Mood normal.    Vital signs in last 24 hours: @VSRANGES @  Labs:   Estimated body mass index is 25.86 kg/m as calculated from the following:   Height as of 10/05/20: 5\' 3"  (1.6 m).   Weight as of 10/05/20: 66.2 kg.   Imaging Review Plain radiographs demonstrate severe degenerative joint disease of the left knee(s). The bone quality appears to be adequate for age and reported activity  level.      Assessment/Plan:  End stage arthritis, left knee   The patient history, physical examination, clinical judgment of the provider and imaging studies are consistent with end stage degenerative joint disease of the left knee(s) and total knee arthroplasty is deemed medically necessary. The treatment options including medical management, injection therapy arthroscopy and arthroplasty were discussed at length. The risks and benefits of total knee arthroplasty were presented and reviewed. The risks due to aseptic loosening, infection, stiffness, patella tracking problems, thromboembolic complications and other imponderables were discussed. The patient acknowledged the explanation, agreed to proceed with the  plan and consent was signed. Patient is being admitted for inpatient treatment for surgery, pain control, PT, OT, prophylactic antibiotics, VTE prophylaxis, progressive ambulation and ADL's and discharge planning. The patient is planning to be discharged  home after overnight observation     Patient's anticipated LOS is less than 2 midnights, meeting these requirements: - Lives within 1 hour of care - Has a competent adult at home to recover with post-op recover - NO history of  - Chronic pain requiring opiods  - Diabetes  - Coronary Artery Disease  - Heart failure  - Heart attack  - Stroke  - DVT/VTE  - Cardiac arrhythmia  - Respiratory Failure/COPD  - Renal failure  - Anemia  - Advanced Liver disease

## 2021-03-26 NOTE — H&P (View-Only) (Signed)
TOTAL KNEE ADMISSION H&P  Patient is being admitted for left total knee arthroplasty.  Subjective:  Chief Complaint:left knee pain.  HPI: Christine Burns, 75 y.o. female, has a history of pain and functional disability in the left knee due to arthritis and has failed non-surgical conservative treatments for greater than 12 weeks to includeNSAID's and/or analgesics, corticosteriod injections, and activity modification.  Onset of symptoms was gradual, starting 3 years ago with gradually worsening course since that time. The patient noted no past surgery on the left knee(s).  Patient currently rates pain in the left knee(s) at 8 out of 10 with activity. Patient has worsening of pain with activity and weight bearing, pain that interferes with activities of daily living, pain with passive range of motion, and joint swelling.  Patient has evidence of subchondral cysts, subchondral sclerosis, and joint space narrowing by imaging studies. There is no active infection.  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 with [facility] x1, but now is managed by PCP- Nyland     Depression    Fibromyalgia    tremors   Hypertension    Hypothyroidism     Past Surgical History:  Procedure Laterality Date   ABDOMINAL HYSTERECTOMY     APPENDECTOMY     BACK SURGERY     cerv. fusion, low back surgery for stenosis    BREAST REDUCTION SURGERY Bilateral 1990's    Current Outpatient Medications  Medication Sig Dispense Refill Last Dose   amLODipine (NORVASC) 5 MG tablet       amphetamine-dextroamphetamine (ADDERALL XR) 30 MG 24 hr capsule Take 30 mg by mouth daily before  breakfast.       baclofen (LIORESAL) 10 MG tablet Take 0.5 tablets (5 mg total) by mouth 3 (three) times daily. 90 each 0    Cholecalciferol 25 MCG (1000 UT) capsule Take by mouth.      DULoxetine (CYMBALTA) 30 MG capsule Take 1 capsule (30 mg total) by mouth 2 (two) times daily. 60 capsule 3    DULoxetine (CYMBALTA) 60 MG capsule Take 60 mg by mouth 2 (two) times daily.       fluticasone (FLONASE) 50 MCG/ACT nasal spray       HYDROcodone-acetaminophen (NORCO) 10-325 MG tablet       levothyroxine (SYNTHROID, LEVOTHROID) 100 MCG tablet Take 100 mcg by mouth daily.      loratadine (CLARITIN) 10 MG tablet Take 10 mg by mouth daily as needed for allergies.       methadone (DOLOPHINE) 10 MG tablet       methylphenidate (RITALIN) 5 MG tablet       mometasone (NASONEX) 50 MCG/ACT nasal spray uses 1 spray in each nostril at bedtime      nystatin (MYCOSTATIN) 100000 UNIT/ML suspension       oxyCODONE-acetaminophen (PERCOCET/ROXICET) 5-325 MG tablet Take 1-2 tablets by mouth every 4 (four) hours as needed for moderate pain. 90 tablet 0    propranolol (INDERAL) 80 MG tablet Take by mouth.      tamsulosin (FLOMAX) 0.4 MG CAPS capsule Take 1 capsule (0.4 mg  total) by mouth daily after supper. 30 capsule 11    traZODone (DESYREL) 100 MG tablet Take 50 mg by mouth at bedtime.       No current facility-administered medications for this visit.   Allergies  Allergen Reactions   Fentanyl Hypertension   Ibuprofen Other (See Comments)    Kidney damage   Zolpidem Nausea And Vomiting and Other (See Comments)    Other reaction(s): Confusion (intolerance) "made me go crazy" "made me go crazy" "made me go crazy" "made me go crazy" Other Reaction: Other reaction    Cyclobenzaprine Other (See Comments)    Other reaction(s): Confusion (intolerance), Hallucinations, Other (See Comments) States goes crazy Cognitive changes. States goes crazy States goes crazy States goes crazy Cognitive changes.     Diazepam Other (See Comments)      SLEEP WALKING, hallucinations   Morphine And Related Rash    Other reaction(s): Other (see comments) Headache Headache Other Reaction: Other reaction    Zolpidem Tartrate Nausea And Vomiting   Metaxalone    Meperidine Nausea Only   Morphine Rash    Social History   Tobacco Use   Smoking status: Never   Smokeless tobacco: Not on file  Substance Use Topics   Alcohol use: No    No family history on file.   Review of Systems  Constitutional: Negative.   HENT: Negative.    Eyes: Negative.   Respiratory: Negative.    Cardiovascular: Negative.   Gastrointestinal: Negative.   Endocrine: Negative.   Genitourinary: Negative.   Musculoskeletal:  Positive for arthralgias.  Skin: Negative.   Allergic/Immunologic: Negative.   Neurological: Negative.   Hematological: Negative.   Psychiatric/Behavioral: Negative.     Objective:  Physical Exam Eyes:     Pupils: Pupils are equal, round, and reactive to light.  Cardiovascular:     Rate and Rhythm: Normal rate.  Pulmonary:     Effort: Pulmonary effort is normal.  Abdominal:     Palpations: Abdomen is soft.     Tenderness: There is no abdominal tenderness.  Genitourinary:    Comments: Deferred Musculoskeletal:        General: Tenderness present. Normal range of motion.     Cervical back: Normal range of motion.  Skin:    General: Skin is warm.  Neurological:     Mental Status: She is alert and oriented to person, place, and time.  Psychiatric:        Mood and Affect: Mood normal.    Vital signs in last 24 hours: @VSRANGES @  Labs:   Estimated body mass index is 25.86 kg/m as calculated from the following:   Height as of 10/05/20: 5\' 3"  (1.6 m).   Weight as of 10/05/20: 66.2 kg.   Imaging Review Plain radiographs demonstrate severe degenerative joint disease of the left knee(s). The bone quality appears to be adequate for age and reported activity  level.      Assessment/Plan:  End stage arthritis, left knee   The patient history, physical examination, clinical judgment of the provider and imaging studies are consistent with end stage degenerative joint disease of the left knee(s) and total knee arthroplasty is deemed medically necessary. The treatment options including medical management, injection therapy arthroscopy and arthroplasty were discussed at length. The risks and benefits of total knee arthroplasty were presented and reviewed. The risks due to aseptic loosening, infection, stiffness, patella tracking problems, thromboembolic complications and other imponderables were discussed. The patient acknowledged the explanation, agreed to proceed with the  plan and consent was signed. Patient is being admitted for inpatient treatment for surgery, pain control, PT, OT, prophylactic antibiotics, VTE prophylaxis, progressive ambulation and ADL's and discharge planning. The patient is planning to be discharged  home after overnight observation     Patient's anticipated LOS is less than 2 midnights, meeting these requirements: - Lives within 1 hour of care - Has a competent adult at home to recover with post-op recover - NO history of  - Chronic pain requiring opiods  - Diabetes  - Coronary Artery Disease  - Heart failure  - Heart attack  - Stroke  - DVT/VTE  - Cardiac arrhythmia  - Respiratory Failure/COPD  - Renal failure  - Anemia  - Advanced Liver disease

## 2021-03-30 ENCOUNTER — Ambulatory Visit: Payer: Self-pay | Admitting: Student

## 2021-03-30 NOTE — Progress Notes (Addendum)
COVID swab appointment: 04/07/21 DOS  COVID Vaccine Completed: yes x2 Date COVID Vaccine completed: 01/07/20, 08/04/20 Has received booster: COVID vaccine manufacturer: Pf[employer]      Date of COVID positive in last 90 days: No  PCP - Dr Chauncy Passy internal medicine  Cardiologist - N/a  Chest x-ray - N/a EKG - 07/22/20 on chart Stress Test - N/a ECHO - 2005 Cardiac Cath - N/a Pacemaker/ICD device last checked: N/a Spinal Cord Stimulator: N/a  Sleep Study - N/a CPAP -   Fasting Blood Sugar - N/a Checks Blood Sugar _____ times a day  Blood Thinner Instructions: N/a Aspirin Instructions: Last Dose:  Activity level: Can go up a flight of stairs and perform activities of daily living without stopping and without symptoms of chest pain or shortness of breath.    Anesthesia review:   Patient denies shortness of breath, fever, cough and chest pain at PAT appointment   Patient verbalized understanding of instructions that were given to them at the PAT appointment. Patient was also instructed that they will need to review over the PAT instructions again at home before surgery.

## 2021-03-30 NOTE — Progress Notes (Signed)
Sent message, via epic in basket, requesting orders in epic from surgeon.  

## 2021-03-30 NOTE — Patient Instructions (Addendum)
DUE TO COVID-19 ONLY ONE VISITOR IS ALLOWED TO COME WITH YOU AND STAY IN THE WAITING ROOM ONLY DURING PRE OP AND PROCEDURE.   **NO VISITORS ARE ALLOWED IN THE SHORT STAY AREA OR RECOVERY ROOM!!**  IF YOU WILL BE ADMITTED INTO THE HOSPITAL YOU ARE ALLOWED ONLY TWO SUPPORT PEOPLE DURING VISITATION HOURS ONLY (10AM -8PM)   The support person(s) may change daily. The support person(s) must pass our screening, gel in and out, and wear a mask at all times, including in the patient's room. Patients must also wear a mask when staff or their support person are in the room.  No visitors under the age of 38. Any visitor under the age of 48 must be accompanied by an adult.    COVID SWAB TESTING MUST BE COMPLETED ON:  04/07/21 day of surgery       Your procedure is scheduled on: 04/07/21   Report to [facility] Main  Entrance    Report to admitting at 5:15 AM   Call this number if you have problems the morning of surgery [phone]   Do not eat food :After Midnight.   May have liquids until 5:30 AM day of surgery  CLEAR LIQUID DIET  Foods Allowed                                                                     Foods Excluded  Water, Black Coffee and tea (No milk or creamer)           liquids that you cannot  Plain Jell-O in any flavor  (No red)                                     see through such as: Fruit ices (not with fruit pulp)                                            milk, soups, orange juice              Iced Popsicles (No red)                                                All solid food                                   Apple juices Sports drinks like Gatorade (No red) Lightly seasoned clear broth or consume(fat free) Sugar, honey syrup    The day of surgery:  Drink ONE (1) Pre-Surgery Clear Ensure by 5:30 am the morning of surgery. Drink in one sitting. Do not sip.  This drink was given to you during your hospital  pre-op appointment visit. Nothing else to drink  after completing the  Pre-Surgery Clear Ensure.          If you have questions, please contact your surgeon's  office.     Oral Hygiene is also important to reduce your risk of infection.                                    Remember - BRUSH YOUR TEETH THE MORNING OF SURGERY WITH YOUR REGULAR TOOTHPASTE   Do NOT smoke after Midnight   Take these medicines the morning of surgery with A SIP OF WATER: Amlodipine, Duloxetine, Norco, Synthroid, Claritin, Percocet, Propranolol                              You may not have any metal on your body including hair pins, jewelry, and body piercing             Do not wear make-up, lotions, powders, perfumes, or deodorant  Do not wear nail polish including gel and S&S, artificial/acrylic nails, or any other type of covering on natural nails including finger and toenails. If you have artificial nails, gel coating, etc. that needs to be removed by a nail salon please have this removed prior to surgery or surgery may need to be canceled/ delayed if the surgeon/ anesthesia feels like they are unable to be safely monitored.   Do not shave  48 hours prior to surgery.    Do not bring valuables to the hospital. [facility] IS NOT             RESPONSIBLE   FOR VALUABLES.   Contacts, dentures or bridgework may not be worn into surgery.   Bring small overnight bag day of surgery.   Please ask you primary care provider about Methadone prior to surgery.   Please read over the following fact sheets you were given: IF YOU HAVE QUESTIONS ABOUT YOUR PRE OP INSTRUCTIONS PLEASE CALL [phone]- [facility] - Preparing for Surgery Before surgery, you can play an important role.  Because skin is not sterile, your skin needs to be as free of germs as possible.  You can reduce the number of germs on your skin by washing with CHG (chlorahexidine gluconate) soap before surgery.  CHG is an antiseptic cleaner which kills germs and bonds with the skin to continue  killing germs even after washing. Please DO NOT use if you have an allergy to CHG or antibacterial soaps.  If your skin becomes reddened/irritated stop using the CHG and inform your nurse when you arrive at Short Stay. Do not shave (including legs and underarms) for at least 48 hours prior to the first CHG shower.  You may shave your face/neck.  Please follow these instructions carefully:  1.  Shower with CHG Soap the night before surgery and the  morning of surgery.  2.  If you choose to wash your hair, wash your hair first as usual with your normal  shampoo.  3.  After you shampoo, rinse your hair and body thoroughly to remove the shampoo.                             4.  Use CHG as you would any other liquid soap.  You can apply chg directly to the skin and wash.  Gently with a scrungie or clean washcloth.  5.  Apply the CHG Soap to your body ONLY FROM THE NECK DOWN.   Do  not use on face/ open                           Wound or open sores. Avoid contact with eyes, ears mouth and   genitals (private parts).                       Wash face,  Genitals (private parts) with your normal soap.             6.  Wash thoroughly, paying special attention to the area where your    surgery  will be performed.  7.  Thoroughly rinse your body with warm water from the neck down.  8.  DO NOT shower/wash with your normal soap after using and rinsing off the CHG Soap.                9.  Pat yourself dry with a clean towel.            10.  Wear clean pajamas.            11.  Place clean sheets on your bed the night of your first shower and do not  sleep with pets. Day of Surgery : Do not apply any lotions/deodorants the morning of surgery.  Please wear clean clothes to the hospital/surgery center.  FAILURE TO FOLLOW THESE INSTRUCTIONS MAY RESULT IN THE CANCELLATION OF YOUR SURGERY  PATIENT SIGNATURE_________________________________  NURSE  SIGNATURE__________________________________  ________________________________________________________________________   Christine Burns  An incentive spirometer is a tool that can help keep your lungs clear and active. This tool measures how well you are filling your lungs with each breath. Taking long deep breaths may help reverse or decrease the chance of developing breathing (pulmonary) problems (especially infection) following: A long period of time when you are unable to move or be active. BEFORE THE PROCEDURE  If the spirometer includes an indicator to show your best effort, your nurse or respiratory therapist will set it to a desired goal. If possible, sit up straight or lean slightly forward. Try not to slouch. Hold the incentive spirometer in an upright position. INSTRUCTIONS FOR USE  Sit on the edge of your bed if possible, or sit up as far as you can in bed or on a chair. Hold the incentive spirometer in an upright position. Breathe out normally. Place the mouthpiece in your mouth and seal your lips tightly around it. Breathe in slowly and as deeply as possible, raising the piston or the ball toward the top of the column. Hold your breath for 3-5 seconds or for as long as possible. Allow the piston or ball to fall to the bottom of the column. Remove the mouthpiece from your mouth and breathe out normally. Rest for a few seconds and repeat Steps 1 through 7 at least 10 times every 1-2 hours when you are awake. Take your time and take a few normal breaths between deep breaths. The spirometer may include an indicator to show your best effort. Use the indicator as a goal to work toward during each repetition. After each set of 10 deep breaths, practice coughing to be sure your lungs are clear. If you have an incision (the cut made at the time of surgery), support your incision when coughing by placing a pillow or rolled up towels firmly against it. Once you are able to get out of  bed, walk around indoors and cough well. You may  stop using the incentive spirometer when instructed by your caregiver.  RISKS AND COMPLICATIONS Take your time so you do not get dizzy or light-headed. If you are in pain, you may need to take or ask for pain medication before doing incentive spirometry. It is harder to take a deep breath if you are having pain. AFTER USE Rest and breathe slowly and easily. It can be helpful to keep track of a log of your progress. Your caregiver can provide you with a simple table to help with this. If you are using the spirometer at home, follow these instructions: SEEK MEDICAL CARE IF:  You are having difficultly using the spirometer. You have trouble using the spirometer as often as instructed. Your pain medication is not giving enough relief while using the spirometer. You develop fever of 100.5 F (38.1 C) or higher. SEEK IMMEDIATE MEDICAL CARE IF:  You cough up bloody sputum that had not been present before. You develop fever of 102 F (38.9 C) or greater. You develop worsening pain at or near the incision site. MAKE SURE YOU:  Understand these instructions. Will watch your condition. Will get help right away if you are not doing well or get worse. Document Released: 11/21/2006 Document Revised: 10/03/2011 Document Reviewed: 01/22/2007 [employer] Patient Information 2014 [employer], [place].   ________________________________________________________________________  WHAT IS A BLOOD TRANSFUSION? Blood Transfusion Information  A transfusion is the replacement of blood or some of its parts. Blood is made up of multiple cells which provide different functions. Red blood cells carry oxygen and are used for blood loss replacement. White blood cells fight against infection. Platelets control bleeding. Plasma helps clot blood. Other blood products are available for specialized needs, such as hemophilia or other clotting disorders. BEFORE THE TRANSFUSION   Who gives blood for transfusions?  Healthy volunteers who are fully evaluated to make sure their blood is safe. This is blood bank blood. Transfusion therapy is the safest it has ever been in the practice of medicine. Before blood is taken from a donor, a complete history is taken to make sure that person has no history of diseases nor engages in risky social behavior (examples are intravenous drug use or sexual activity with multiple partners). The donor's travel history is screened to minimize risk of transmitting infections, such as malaria. The donated blood is tested for signs of infectious diseases, such as HIV and hepatitis. The blood is then tested to be sure it is compatible with you in order to minimize the chance of a transfusion reaction. If you or a relative donates blood, this is often done in anticipation of surgery and is not appropriate for emergency situations. It takes many days to process the donated blood. RISKS AND COMPLICATIONS Although transfusion therapy is very safe and saves many lives, the main dangers of transfusion include:  Getting an infectious disease. Developing a transfusion reaction. This is an allergic reaction to something in the blood you were given. Every precaution is taken to prevent this. The decision to have a blood transfusion has been considered carefully by your caregiver before blood is given. Blood is not given unless the benefits outweigh the risks. AFTER THE TRANSFUSION Right after receiving a blood transfusion, you will usually feel much better and more energetic. This is especially true if your red blood cells have gotten low (anemic). The transfusion raises the level of the red blood cells which carry oxygen, and this usually causes an energy increase. The nurse administering the transfusion will monitor you  carefully for complications. HOME CARE INSTRUCTIONS  No special instructions are needed after a transfusion. You may find your energy is  better. Speak with your caregiver about any limitations on activity for underlying diseases you may have. SEEK MEDICAL CARE IF:  Your condition is not improving after your transfusion. You develop redness or irritation at the intravenous (IV) site. SEEK IMMEDIATE MEDICAL CARE IF:  Any of the following symptoms occur over the next 12 hours: Shaking chills. You have a temperature by mouth above 102 F (38.9 C), not controlled by medicine. Chest, back, or muscle pain. People around you feel you are not acting correctly or are confused. Shortness of breath or difficulty breathing. Dizziness and fainting. You get a rash or develop hives. You have a decrease in urine output. Your urine turns a dark color or changes to pink, red, or brown. Any of the following symptoms occur over the next 10 days: You have a temperature by mouth above 102 F (38.9 C), not controlled by medicine. Shortness of breath. Weakness after normal activity. The white part of the eye turns yellow (jaundice). You have a decrease in the amount of urine or are urinating less often. Your urine turns a dark color or changes to pink, red, or brown. Document Released: 07/08/2000 Document Revised: 10/03/2011 Document Reviewed: 02/25/2008 [facility] Patient Information 2014 [place], [place].  _______________________________________________________________________

## 2021-03-31 ENCOUNTER — Encounter (HOSPITAL_COMMUNITY): Payer: Self-pay

## 2021-03-31 ENCOUNTER — Encounter (HOSPITAL_COMMUNITY)
Admission: RE | Admit: 2021-03-31 | Discharge: 2021-03-31 | Disposition: A | Discharge status: Home / Self Care | Payer: Medicare HMO | Source: Ambulatory Visit | Attending: Orthopedic Surgery | Admitting: Orthopedic Surgery

## 2021-03-31 ENCOUNTER — Other Ambulatory Visit: Payer: Self-pay

## 2021-03-31 DIAGNOSIS — Z01812 Encounter for preprocedural laboratory examination: Secondary | ICD-10-CM | POA: Insufficient documentation

## 2021-03-31 HISTORY — DX: Anemia, unspecified: D64.9

## 2021-03-31 LAB — CBC
HCT: 35 % — ABNORMAL LOW (ref 36.0–46.0)
Hemoglobin: 11.1 g/dL — ABNORMAL LOW (ref 12.0–15.0)
MCH: 29.1 pg (ref 26.0–34.0)
MCHC: 31.7 g/dL (ref 30.0–36.0)
MCV: 91.6 fL (ref 80.0–100.0)
Platelets: 184 10*3/uL (ref 150–400)
RBC: 3.82 MIL/uL — ABNORMAL LOW (ref 3.87–5.11)
RDW: 13.6 % (ref 11.5–15.5)
WBC: 4.7 10*3/uL (ref 4.0–10.5)
nRBC: 0 % (ref 0.0–0.2)

## 2021-03-31 LAB — PROTIME-INR
INR: 1 (ref 0.8–1.2)
Prothrombin Time: 13.2 seconds (ref 11.4–15.2)

## 2021-03-31 LAB — COMPREHENSIVE METABOLIC PANEL
ALT: 11 U/L (ref 0–44)
AST: 14 U/L — ABNORMAL LOW (ref 15–41)
Albumin: 3.9 g/dL (ref 3.5–5.0)
Alkaline Phosphatase: 60 U/L (ref 38–126)
Anion gap: 6 (ref 5–15)
BUN: 22 mg/dL (ref 8–23)
CO2: 29 mmol/L (ref 22–32)
Calcium: 8.9 mg/dL (ref 8.9–10.3)
Chloride: 106 mmol/L (ref 98–111)
Creatinine, Ser: 1.47 mg/dL — ABNORMAL HIGH (ref 0.44–1.00)
GFR, Estimated: 37 mL/min — ABNORMAL LOW (ref >60)
Glucose, Bld: 92 mg/dL (ref 70–99)
Potassium: 4 mmol/L (ref 3.5–5.1)
Sodium: 141 mmol/L (ref 135–145)
Total Bilirubin: 0.5 mg/dL (ref 0.3–1.2)
Total Protein: 6.9 g/dL (ref 6.5–8.1)

## 2021-03-31 LAB — SURGICAL PCR SCREEN
MRSA, PCR: NEGATIVE
Staphylococcus aureus: POSITIVE — AB

## 2021-03-31 NOTE — Progress Notes (Signed)
PCR test came back [facility] positive. Results sent tot Dr. Linna Caprice.

## 2021-04-05 DIAGNOSIS — E559 Vitamin D deficiency, unspecified: Secondary | ICD-10-CM | POA: Diagnosis not present

## 2021-04-05 DIAGNOSIS — N1832 Chronic kidney disease, stage 3b: Secondary | ICD-10-CM | POA: Diagnosis not present

## 2021-04-05 DIAGNOSIS — I129 Hypertensive chronic kidney disease with stage 1 through stage 4 chronic kidney disease, or unspecified chronic kidney disease: Secondary | ICD-10-CM | POA: Diagnosis not present

## 2021-04-05 DIAGNOSIS — D638 Anemia in other chronic diseases classified elsewhere: Secondary | ICD-10-CM | POA: Diagnosis not present

## 2021-04-05 DIAGNOSIS — Z299 Encounter for prophylactic measures, unspecified: Secondary | ICD-10-CM | POA: Diagnosis not present

## 2021-04-05 DIAGNOSIS — I10 Essential (primary) hypertension: Secondary | ICD-10-CM | POA: Diagnosis not present

## 2021-04-05 DIAGNOSIS — M1712 Unilateral primary osteoarthritis, left knee: Secondary | ICD-10-CM | POA: Diagnosis not present

## 2021-04-05 DIAGNOSIS — R809 Proteinuria, unspecified: Secondary | ICD-10-CM | POA: Diagnosis not present

## 2021-04-06 NOTE — Anesthesia Preprocedure Evaluation (Addendum)
Anesthesia Evaluation  Patient identified by MRN, date of birth, ID band Patient awake    Reviewed: Allergy & Precautions, NPO status , Patient's Chart, lab work & pertinent test results  Airway Mallampati: II  TM Distance: >3 FB Neck ROM: Full    Dental no notable dental hx. (+) Lower Dentures, Partial Upper,    Pulmonary    Pulmonary exam normal breath sounds clear to auscultation       Cardiovascular hypertension, Pt. on medications Normal cardiovascular exam Rhythm:Regular Rate:Normal     Neuro/Psych Anxiety  Neuromuscular disease    GI/Hepatic negative GI ROS, Neg liver ROS,   Endo/Other    Renal/GU Renal InsufficiencyRenal diseaseLab Results      Component                Value               Date                      CREATININE               1.47 (H)            03/31/2021                BUN                      22                  03/31/2021                NA                       141                 03/31/2021                K                        4.0                 03/31/2021                CL                       106                 03/31/2021                CO2                      29                  03/31/2021                Musculoskeletal  (+) Arthritis , Fibromyalgia -  Abdominal   Peds  Hematology Lab Results      Component                Value               Date                      WBC                      4.7  03/31/2021                HGB                      11.1 (L)            03/31/2021                HCT                      35.0 (L)            03/31/2021                MCV                      91.6                03/31/2021                PLT                      184                 03/31/2021              Anesthesia Other Findings All see list  Reproductive/Obstetrics                           Anesthesia Physical Anesthesia Plan  ASA:  3  Anesthesia Plan: Spinal   Post-op Pain Management:  Regional for Post-op pain   Induction:   PONV Risk Score and Plan: Treatment may vary due to age or medical condition and Ondansetron  Airway Management Planned: Natural Airway and Nasal Cannula  Additional Equipment: None  Intra-op Plan:   Post-operative Plan:   Informed Consent: I have reviewed the patients History and Physical, chart, labs and discussed the procedure including the risks, benefits and alternatives for the proposed anesthesia with the patient or authorized representative who has indicated his/her understanding and acceptance.     Dental advisory given  Plan Discussed with: CRNA  Anesthesia Plan Comments: (Spinal w L adductor Canal)       Anesthesia Quick Evaluation

## 2021-04-07 ENCOUNTER — Ambulatory Visit (HOSPITAL_COMMUNITY): Payer: Medicare HMO

## 2021-04-07 ENCOUNTER — Encounter (HOSPITAL_COMMUNITY): Payer: Self-pay | Admitting: Orthopedic Surgery

## 2021-04-07 ENCOUNTER — Encounter (HOSPITAL_COMMUNITY)
Admission: AD | Disposition: A | Discharge status: Home Health | Payer: Self-pay | Source: Home / Self Care | Attending: Orthopedic Surgery

## 2021-04-07 ENCOUNTER — Ambulatory Visit (HOSPITAL_COMMUNITY): Payer: Medicare HMO | Admitting: Certified Registered Nurse Anesthetist

## 2021-04-07 ENCOUNTER — Inpatient Hospital Stay (HOSPITAL_COMMUNITY)
Admission: AD | Admit: 2021-04-07 | Discharge: 2021-04-11 | DRG: 470 | Disposition: A | Discharge status: Home Health | Payer: Medicare HMO | Attending: Orthopedic Surgery | Admitting: Orthopedic Surgery

## 2021-04-07 ENCOUNTER — Other Ambulatory Visit: Payer: Self-pay

## 2021-04-07 DIAGNOSIS — E039 Hypothyroidism, unspecified: Secondary | ICD-10-CM | POA: Diagnosis present

## 2021-04-07 DIAGNOSIS — F418 Other specified anxiety disorders: Secondary | ICD-10-CM | POA: Diagnosis not present

## 2021-04-07 DIAGNOSIS — M5116 Intervertebral disc disorders with radiculopathy, lumbar region: Secondary | ICD-10-CM | POA: Diagnosis not present

## 2021-04-07 DIAGNOSIS — M1712 Unilateral primary osteoarthritis, left knee: Secondary | ICD-10-CM | POA: Diagnosis present

## 2021-04-07 DIAGNOSIS — Z20822 Contact with and (suspected) exposure to covid-19: Secondary | ICD-10-CM | POA: Diagnosis present

## 2021-04-07 DIAGNOSIS — I10 Essential (primary) hypertension: Secondary | ICD-10-CM | POA: Diagnosis present

## 2021-04-07 DIAGNOSIS — Z885 Allergy status to narcotic agent status: Secondary | ICD-10-CM

## 2021-04-07 DIAGNOSIS — Z90710 Acquired absence of both cervix and uterus: Secondary | ICD-10-CM

## 2021-04-07 DIAGNOSIS — Z888 Allergy status to other drugs, medicaments and biological substances status: Secondary | ICD-10-CM

## 2021-04-07 DIAGNOSIS — G8918 Other acute postprocedural pain: Secondary | ICD-10-CM | POA: Diagnosis not present

## 2021-04-07 DIAGNOSIS — Z96652 Presence of left artificial knee joint: Secondary | ICD-10-CM | POA: Diagnosis not present

## 2021-04-07 DIAGNOSIS — F419 Anxiety disorder, unspecified: Secondary | ICD-10-CM | POA: Diagnosis present

## 2021-04-07 DIAGNOSIS — Z79890 Hormone replacement therapy (postmenopausal): Secondary | ICD-10-CM

## 2021-04-07 DIAGNOSIS — Z79899 Other long term (current) drug therapy: Secondary | ICD-10-CM

## 2021-04-07 DIAGNOSIS — M797 Fibromyalgia: Secondary | ICD-10-CM | POA: Diagnosis present

## 2021-04-07 HISTORY — PX: KNEE ARTHROPLASTY: SHX992

## 2021-04-07 LAB — TYPE AND SCREEN
ABO/RH(D): A POS
Antibody Screen: NEGATIVE

## 2021-04-07 LAB — SARS CORONAVIRUS 2 BY RT PCR (HOSPITAL ORDER, PERFORMED IN ~~LOC~~ HOSPITAL LAB): SARS Coronavirus 2: NEGATIVE

## 2021-04-07 SURGERY — ARTHROPLASTY, KNEE, TOTAL, USING IMAGELESS COMPUTER-ASSISTED NAVIGATION
Anesthesia: Spinal | Site: Knee | Laterality: Left

## 2021-04-07 MED ORDER — POLYETHYLENE GLYCOL 3350 17 G PO PACK: 17.0000 g | PACK | Freq: Every day | ORAL | Status: DC | PRN

## 2021-04-07 MED ORDER — CHLORHEXIDINE GLUCONATE 0.12 % MT SOLN
15.0000 mL | Freq: Once | OROMUCOSAL | Status: AC
  Administered 2021-04-07: 15 mL via OROMUCOSAL

## 2021-04-07 MED ORDER — TRANEXAMIC ACID-NACL 1000-0.7 MG/100ML-% IV SOLN
1000.0000 mg | Freq: Once | INTRAVENOUS | Status: AC
  Administered 2021-04-07: 1000 mg via INTRAVENOUS
  Filled 2021-04-07: qty 100

## 2021-04-07 MED ORDER — ACETAMINOPHEN 10 MG/ML IV SOLN
INTRAVENOUS | Status: AC
  Filled 2021-04-07: qty 100

## 2021-04-07 MED ORDER — PROPRANOLOL HCL ER 60 MG PO CP24
120.0000 mg | ORAL_CAPSULE | Freq: Every day | ORAL | Status: DC
  Administered 2021-04-08 – 2021-04-11 (×4): 120 mg via ORAL
  Filled 2021-04-07: qty 1
  Filled 2021-04-07: qty 2
  Filled 2021-04-07: qty 1
  Filled 2021-04-07: qty 2
  Filled 2021-04-07: qty 1

## 2021-04-07 MED ORDER — PHENYLEPHRINE HCL-NACL 20-0.9 MG/250ML-% IV SOLN
INTRAVENOUS | Status: AC
  Filled 2021-04-07: qty 500

## 2021-04-07 MED ORDER — DEXAMETHASONE SODIUM PHOSPHATE 10 MG/ML IJ SOLN
10.0000 mg | Freq: Once | INTRAMUSCULAR | Status: AC
  Administered 2021-04-08: 10 mg via INTRAVENOUS
  Filled 2021-04-07: qty 1

## 2021-04-07 MED ORDER — ORAL CARE MOUTH RINSE: 15.0000 mL | Freq: Once | OROMUCOSAL | Status: AC

## 2021-04-07 MED ORDER — LIDOCAINE 2% (20 MG/ML) 5 ML SYRINGE
INTRAMUSCULAR | Status: DC | PRN
  Administered 2021-04-07: 40 mg via INTRAVENOUS

## 2021-04-07 MED ORDER — ACETAMINOPHEN 10 MG/ML IV SOLN
1000.0000 mg | Freq: Once | INTRAVENOUS | Status: AC
  Administered 2021-04-07: 1000 mg via INTRAVENOUS
  Filled 2021-04-07: qty 100

## 2021-04-07 MED ORDER — ISOPROPYL ALCOHOL 70 % SOLN
Status: DC | PRN
  Administered 2021-04-07: 1 via TOPICAL

## 2021-04-07 MED ORDER — METHOCARBAMOL 500 MG IVPB - SIMPLE MED
500.0000 mg | Freq: Four times a day (QID) | INTRAVENOUS | Status: DC | PRN
  Filled 2021-04-07: qty 50

## 2021-04-07 MED ORDER — LIDOCAINE 2% (20 MG/ML) 5 ML SYRINGE
INTRAMUSCULAR | Status: AC
  Filled 2021-04-07: qty 5

## 2021-04-07 MED ORDER — SENNA 8.6 MG PO TABS
1.0000 | ORAL_TABLET | Freq: Two times a day (BID) | ORAL | Status: DC
  Administered 2021-04-09 – 2021-04-11 (×3): 8.6 mg via ORAL
  Filled 2021-04-07 (×7): qty 1

## 2021-04-07 MED ORDER — BUPIVACAINE-EPINEPHRINE (PF) 0.25% -1:200000 IJ SOLN
INTRAMUSCULAR | Status: AC
  Filled 2021-04-07: qty 30

## 2021-04-07 MED ORDER — FENTANYL CITRATE PF 50 MCG/ML IJ SOSY
25.0000 ug | PREFILLED_SYRINGE | INTRAMUSCULAR | Status: DC | PRN
  Administered 2021-04-07: 50 ug via INTRAVENOUS
  Administered 2021-04-07: 25 ug via INTRAVENOUS

## 2021-04-07 MED ORDER — FENTANYL CITRATE PF 50 MCG/ML IJ SOSY
PREFILLED_SYRINGE | INTRAMUSCULAR | Status: AC
  Filled 2021-04-07: qty 1

## 2021-04-07 MED ORDER — ONDANSETRON HCL 4 MG/2ML IJ SOLN
INTRAMUSCULAR | Status: DC | PRN
  Administered 2021-04-07: 4 mg via INTRAVENOUS

## 2021-04-07 MED ORDER — ALUM & MAG HYDROXIDE-SIMETH 200-200-20 MG/5ML PO SUSP
30.0000 mL | ORAL | Status: DC | PRN
  Administered 2021-04-09: 30 mL via ORAL
  Filled 2021-04-07: qty 30

## 2021-04-07 MED ORDER — ASPIRIN 81 MG PO CHEW
81.0000 mg | CHEWABLE_TABLET | Freq: Two times a day (BID) | ORAL | Status: DC
  Administered 2021-04-07 – 2021-04-11 (×8): 81 mg via ORAL
  Filled 2021-04-07 (×8): qty 1

## 2021-04-07 MED ORDER — AMLODIPINE BESYLATE 5 MG PO TABS
2.5000 mg | ORAL_TABLET | Freq: Every day | ORAL | Status: DC
  Administered 2021-04-08 – 2021-04-11 (×4): 2.5 mg via ORAL
  Filled 2021-04-07 (×4): qty 1

## 2021-04-07 MED ORDER — POVIDONE-IODINE 10 % EX SWAB: 2.0000 "application " | Freq: Once | CUTANEOUS | Status: DC

## 2021-04-07 MED ORDER — ACETAMINOPHEN 325 MG PO TABS
325.0000 mg | ORAL_TABLET | Freq: Four times a day (QID) | ORAL | Status: DC | PRN
  Administered 2021-04-11 (×2): 650 mg via ORAL
  Filled 2021-04-07 (×2): qty 2

## 2021-04-07 MED ORDER — ONDANSETRON HCL 4 MG/2ML IJ SOLN
INTRAMUSCULAR | Status: AC
  Filled 2021-04-07: qty 2

## 2021-04-07 MED ORDER — METHOCARBAMOL 500 MG PO TABS
500.0000 mg | ORAL_TABLET | Freq: Four times a day (QID) | ORAL | Status: DC | PRN
  Administered 2021-04-07 – 2021-04-11 (×8): 500 mg via ORAL
  Filled 2021-04-07 (×8): qty 1

## 2021-04-07 MED ORDER — ACETAMINOPHEN 10 MG/ML IV SOLN: 1000.0000 mg | Freq: Once | INTRAVENOUS | Status: DC | PRN

## 2021-04-07 MED ORDER — PREGABALIN 100 MG PO CAPS
100.0000 mg | ORAL_CAPSULE | Freq: Every day | ORAL | Status: DC
  Administered 2021-04-08 – 2021-04-11 (×4): 100 mg via ORAL
  Filled 2021-04-07 (×4): qty 1

## 2021-04-07 MED ORDER — LORATADINE 10 MG PO TABS: 10.0000 mg | ORAL_TABLET | Freq: Every day | ORAL | Status: DC | PRN

## 2021-04-07 MED ORDER — CEFAZOLIN SODIUM-DEXTROSE 2-4 GM/100ML-% IV SOLN
2.0000 g | Freq: Four times a day (QID) | INTRAVENOUS | Status: AC
Start: 2021-04-07 — End: 2021-04-07
  Administered 2021-04-07 (×2): 2 g via INTRAVENOUS
  Filled 2021-04-07 (×2): qty 100

## 2021-04-07 MED ORDER — LEVOTHYROXINE SODIUM 100 MCG PO TABS
100.0000 ug | ORAL_TABLET | Freq: Every day | ORAL | Status: DC
  Administered 2021-04-08 – 2021-04-11 (×4): 100 ug via ORAL
  Filled 2021-04-07 (×4): qty 1

## 2021-04-07 MED ORDER — FENTANYL CITRATE (PF) 100 MCG/2ML IJ SOLN
INTRAMUSCULAR | Status: DC | PRN
  Administered 2021-04-07 (×2): 50 ug via INTRAVENOUS

## 2021-04-07 MED ORDER — DEXAMETHASONE SODIUM PHOSPHATE 10 MG/ML IJ SOLN
INTRAMUSCULAR | Status: DC | PRN
  Administered 2021-04-07: 4 mg via INTRAVENOUS

## 2021-04-07 MED ORDER — PHENOL 1.4 % MT LIQD: 1.0000 | OROMUCOSAL | Status: DC | PRN

## 2021-04-07 MED ORDER — OXYCODONE HCL 5 MG PO TABS
10.0000 mg | ORAL_TABLET | ORAL | Status: DC | PRN
  Administered 2021-04-07 – 2021-04-08 (×4): 15 mg via ORAL
  Filled 2021-04-07 (×4): qty 3
  Filled 2021-04-07: qty 2

## 2021-04-07 MED ORDER — 0.9 % SODIUM CHLORIDE (POUR BTL) OPTIME
TOPICAL | Status: DC | PRN
  Administered 2021-04-07: 1000 mL

## 2021-04-07 MED ORDER — SODIUM CHLORIDE 0.9 % IR SOLN
Status: DC | PRN
  Administered 2021-04-07: 1000 mL

## 2021-04-07 MED ORDER — DEXAMETHASONE SODIUM PHOSPHATE 10 MG/ML IJ SOLN
INTRAMUSCULAR | Status: AC
  Filled 2021-04-07: qty 1

## 2021-04-07 MED ORDER — POVIDONE-IODINE 10 % EX SWAB
2.0000 "application " | Freq: Once | CUTANEOUS | Status: AC
  Administered 2021-04-07: 2 via TOPICAL

## 2021-04-07 MED ORDER — SODIUM CHLORIDE 0.9 % IV SOLN
INTRAVENOUS | Status: DC
  Administered 2021-04-07: 1000 mL via INTRAVENOUS

## 2021-04-07 MED ORDER — OXYCODONE HCL 5 MG PO TABS
ORAL_TABLET | ORAL | Status: AC
  Filled 2021-04-07: qty 1

## 2021-04-07 MED ORDER — SODIUM CHLORIDE (PF) 0.9 % IJ SOLN
INTRAMUSCULAR | Status: DC | PRN
  Administered 2021-04-07: 30 mL

## 2021-04-07 MED ORDER — ONDANSETRON HCL 4 MG/2ML IJ SOLN: 4.0000 mg | Freq: Once | INTRAMUSCULAR | Status: DC | PRN

## 2021-04-07 MED ORDER — SODIUM CHLORIDE 0.9 % IV SOLN: INTRAVENOUS | Status: DC

## 2021-04-07 MED ORDER — ISOPROPYL ALCOHOL 70 % SOLN
Status: AC
  Filled 2021-04-07: qty 480

## 2021-04-07 MED ORDER — FENTANYL CITRATE (PF) 100 MCG/2ML IJ SOLN
INTRAMUSCULAR | Status: AC
  Filled 2021-04-07: qty 2

## 2021-04-07 MED ORDER — DOCUSATE SODIUM 100 MG PO CAPS
100.0000 mg | ORAL_CAPSULE | Freq: Two times a day (BID) | ORAL | Status: DC
  Administered 2021-04-09 – 2021-04-11 (×3): 100 mg via ORAL
  Filled 2021-04-07 (×8): qty 1

## 2021-04-07 MED ORDER — DIPHENHYDRAMINE HCL 12.5 MG/5ML PO ELIX
12.5000 mg | ORAL_SOLUTION | ORAL | Status: DC | PRN
  Administered 2021-04-11: 25 mg via ORAL
  Filled 2021-04-07: qty 10

## 2021-04-07 MED ORDER — TRAZODONE HCL 100 MG PO TABS
100.0000 mg | ORAL_TABLET | Freq: Every day | ORAL | Status: DC
  Administered 2021-04-07 – 2021-04-10 (×3): 100 mg via ORAL
  Filled 2021-04-07 (×4): qty 1

## 2021-04-07 MED ORDER — MENTHOL 3 MG MT LOZG: 1.0000 | LOZENGE | OROMUCOSAL | Status: DC | PRN

## 2021-04-07 MED ORDER — KETOROLAC TROMETHAMINE 30 MG/ML IJ SOLN
INTRAMUSCULAR | Status: DC | PRN
  Administered 2021-04-07: 30 mg via INTRA_ARTICULAR

## 2021-04-07 MED ORDER — METOCLOPRAMIDE HCL 5 MG/ML IJ SOLN: 5.0000 mg | Freq: Three times a day (TID) | INTRAMUSCULAR | Status: DC | PRN

## 2021-04-07 MED ORDER — HYDROMORPHONE HCL 1 MG/ML IJ SOLN
0.5000 mg | INTRAMUSCULAR | Status: DC | PRN
  Administered 2021-04-07 – 2021-04-08 (×3): 1 mg via INTRAVENOUS
  Filled 2021-04-07 (×3): qty 1

## 2021-04-07 MED ORDER — OXYCODONE HCL 5 MG PO TABS
5.0000 mg | ORAL_TABLET | ORAL | Status: DC | PRN
  Administered 2021-04-07: 10 mg via ORAL
  Administered 2021-04-07: 5 mg via ORAL

## 2021-04-07 MED ORDER — DULOXETINE HCL 60 MG PO CPEP
60.0000 mg | ORAL_CAPSULE | Freq: Every day | ORAL | Status: DC
  Administered 2021-04-08 – 2021-04-11 (×4): 60 mg via ORAL
  Filled 2021-04-07 (×4): qty 1

## 2021-04-07 MED ORDER — FENTANYL CITRATE PF 50 MCG/ML IJ SOSY
PREFILLED_SYRINGE | INTRAMUSCULAR | Status: AC
  Administered 2021-04-07: 50 ug via INTRAVENOUS
  Filled 2021-04-07: qty 1

## 2021-04-07 MED ORDER — PHENYLEPHRINE HCL (PRESSORS) 10 MG/ML IV SOLN
INTRAVENOUS | Status: AC
  Filled 2021-04-07: qty 2

## 2021-04-07 MED ORDER — PHENYLEPHRINE HCL-NACL 20-0.9 MG/250ML-% IV SOLN
INTRAVENOUS | Status: DC | PRN
  Administered 2021-04-07: 40 ug/min via INTRAVENOUS

## 2021-04-07 MED ORDER — ONDANSETRON HCL 4 MG PO TABS: 4.0000 mg | ORAL_TABLET | Freq: Four times a day (QID) | ORAL | Status: DC | PRN

## 2021-04-07 MED ORDER — METHADONE HCL 10 MG PO TABS
20.0000 mg | ORAL_TABLET | Freq: Two times a day (BID) | ORAL | Status: DC
  Administered 2021-04-07 – 2021-04-11 (×8): 20 mg via ORAL
  Filled 2021-04-07 (×8): qty 2

## 2021-04-07 MED ORDER — KETOROLAC TROMETHAMINE 30 MG/ML IJ SOLN
INTRAMUSCULAR | Status: AC
  Filled 2021-04-07: qty 1

## 2021-04-07 MED ORDER — METOCLOPRAMIDE HCL 5 MG PO TABS: 5.0000 mg | ORAL_TABLET | Freq: Three times a day (TID) | ORAL | Status: DC | PRN

## 2021-04-07 MED ORDER — BUPIVACAINE-EPINEPHRINE 0.25% -1:200000 IJ SOLN
INTRAMUSCULAR | Status: DC | PRN
  Administered 2021-04-07: 30 mL

## 2021-04-07 MED ORDER — PROPOFOL 500 MG/50ML IV EMUL
INTRAVENOUS | Status: DC | PRN
  Administered 2021-04-07: 75 ug/kg/min via INTRAVENOUS

## 2021-04-07 MED ORDER — PROPOFOL 1000 MG/100ML IV EMUL
INTRAVENOUS | Status: AC
  Filled 2021-04-07: qty 100

## 2021-04-07 MED ORDER — LACTATED RINGERS IV SOLN: INTRAVENOUS | Status: DC

## 2021-04-07 MED ORDER — TRANEXAMIC ACID-NACL 1000-0.7 MG/100ML-% IV SOLN
1000.0000 mg | INTRAVENOUS | Status: AC
  Administered 2021-04-07: 1000 mg via INTRAVENOUS
  Filled 2021-04-07: qty 100

## 2021-04-07 MED ORDER — STERILE WATER FOR IRRIGATION IR SOLN
Status: DC | PRN
  Administered 2021-04-07: 2000 mL

## 2021-04-07 MED ORDER — PROPOFOL 10 MG/ML IV BOLUS
INTRAVENOUS | Status: DC | PRN
  Administered 2021-04-07 (×2): 20 mg via INTRAVENOUS

## 2021-04-07 MED ORDER — FLUTICASONE PROPIONATE 50 MCG/ACT NA SUSP
1.0000 | Freq: Every day | NASAL | Status: DC | PRN
  Filled 2021-04-07: qty 16

## 2021-04-07 MED ORDER — ONDANSETRON HCL 4 MG/2ML IJ SOLN: 4.0000 mg | Freq: Four times a day (QID) | INTRAMUSCULAR | Status: DC | PRN

## 2021-04-07 MED ORDER — FENTANYL CITRATE PF 50 MCG/ML IJ SOSY
PREFILLED_SYRINGE | INTRAMUSCULAR | Status: AC
  Administered 2021-04-07: 25 ug via INTRAVENOUS
  Filled 2021-04-07: qty 1

## 2021-04-07 MED ORDER — CEFAZOLIN SODIUM-DEXTROSE 2-4 GM/100ML-% IV SOLN
2.0000 g | INTRAVENOUS | Status: AC
  Administered 2021-04-07: 2 g via INTRAVENOUS
  Filled 2021-04-07: qty 100

## 2021-04-07 SURGICAL SUPPLY — 77 items
ADH SKN CLS APL DERMABOND .7 (GAUZE/BANDAGES/DRESSINGS) ×1
APL PRP STRL LF DISP 70% ISPRP (MISCELLANEOUS) ×2
BAG COUNTER SPONGE SURGICOUNT (BAG) IMPLANT
BAG SPEC THK2 15X12 ZIP CLS (MISCELLANEOUS)
BAG SPNG CNTER NS LX DISP (BAG)
BAG ZIPLOCK 12X15 (MISCELLANEOUS) IMPLANT
BATTERY INSTRU NAVIGATION (MISCELLANEOUS) ×6 IMPLANT
BEARING TIBIA INSRT KNEE SZ4 9 (Insert) ×1 IMPLANT
BLADE SAW RECIPROCATING 77.5 (BLADE) ×2 IMPLANT
BNDG CMPR MED 10X6 ELC LF (GAUZE/BANDAGES/DRESSINGS) ×1
BNDG ELASTIC 4X5.8 VLCR STR LF (GAUZE/BANDAGES/DRESSINGS) ×2 IMPLANT
BNDG ELASTIC 6X10 VLCR STRL LF (GAUZE/BANDAGES/DRESSINGS) ×2 IMPLANT
BNDG ELASTIC 6X5.8 VLCR STR LF (GAUZE/BANDAGES/DRESSINGS) ×2 IMPLANT
BSPLAT TIB 4 KN TRITANIUM (Knees) ×1 IMPLANT
BTRY SRG DRVR LF (MISCELLANEOUS) ×3
CHLORAPREP W/TINT 26 (MISCELLANEOUS) ×4 IMPLANT
COVER SURGICAL LIGHT HANDLE (MISCELLANEOUS) ×2 IMPLANT
CUFF TOURN SGL QUICK 34 (TOURNIQUET CUFF) ×2
CUFF TRNQT CYL 34X4.125X (TOURNIQUET CUFF) ×1 IMPLANT
DECANTER SPIKE VIAL GLASS SM (MISCELLANEOUS) ×4 IMPLANT
DERMABOND ADVANCED (GAUZE/BANDAGES/DRESSINGS) ×1
DERMABOND ADVANCED .7 DNX12 (GAUZE/BANDAGES/DRESSINGS) ×1 IMPLANT
DRAPE INCISE IOBAN 66X45 STRL (DRAPES) ×6 IMPLANT
DRAPE SHEET LG 3/4 BI-LAMINATE (DRAPES) ×6 IMPLANT
DRAPE U-SHAPE 47X51 STRL (DRAPES) ×2 IMPLANT
DRSG AQUACEL AG ADV 3.5X10 (GAUZE/BANDAGES/DRESSINGS) ×2 IMPLANT
DRSG TEGADERM 4X4.75 (GAUZE/BANDAGES/DRESSINGS) IMPLANT
ELECT BLADE TIP CTD 4 INCH (ELECTRODE) ×2 IMPLANT
ELECT REM PT RETURN 15FT ADLT (MISCELLANEOUS) ×2 IMPLANT
EVACUATOR 1/8 PVC DRAIN (DRAIN) IMPLANT
FEMORAL RETAINING CRUC KNEE #4 (Knees) ×2 IMPLANT
GAUZE SPONGE 4X4 12PLY STRL (GAUZE/BANDAGES/DRESSINGS) ×2 IMPLANT
GLOVE SRG 8 PF TXTR STRL LF DI (GLOVE) ×2 IMPLANT
GLOVE SURG ENC MOIS LTX SZ8.5 (GLOVE) ×4 IMPLANT
GLOVE SURG ENC TEXT LTX SZ7.5 (GLOVE) ×6 IMPLANT
GLOVE SURG UNDER POLY LF SZ8 (GLOVE) ×4
GLOVE SURG UNDER POLY LF SZ8.5 (GLOVE) ×2 IMPLANT
GOWN SPEC L3 XXLG W/TWL (GOWN DISPOSABLE) ×2 IMPLANT
GOWN SPEC L4 XLG W/TWL (GOWN DISPOSABLE) ×2 IMPLANT
HANDPIECE INTERPULSE COAX TIP (DISPOSABLE) ×2
HOLDER FOLEY CATH W/STRAP (MISCELLANEOUS) ×2 IMPLANT
HOOD PEEL AWAY FLYTE STAYCOOL (MISCELLANEOUS) ×6 IMPLANT
JET LAVAGE IRRISEPT WOUND (IRRIGATION / IRRIGATOR)
KIT TURNOVER KIT A (KITS) ×2 IMPLANT
KNEE PATELLA ASYMMETRIC 10X35 (Knees) ×2 IMPLANT
KNEE TIBIAL COMP TRI SZ4 (Knees) ×2 IMPLANT
LAVAGE JET IRRISEPT WOUND (IRRIGATION / IRRIGATOR) IMPLANT
MARKER SKIN DUAL TIP RULER LAB (MISCELLANEOUS) ×2 IMPLANT
NDL SAFETY ECLIPSE 18X1.5 (NEEDLE) ×1 IMPLANT
NEEDLE HYPO 18GX1.5 SHARP (NEEDLE) ×2
NEEDLE SPNL 18GX3.5 QUINCKE PK (NEEDLE) ×2 IMPLANT
NS IRRIG 1000ML POUR BTL (IV SOLUTION) ×2 IMPLANT
PACK TOTAL KNEE CUSTOM (KITS) ×2 IMPLANT
PADDING CAST COTTON 6X4 STRL (CAST SUPPLIES) ×2 IMPLANT
PIN FLUTED HEDLESS FIX 3.5X1/8 (PIN) ×2 IMPLANT
PROTECTOR NERVE ULNAR (MISCELLANEOUS) ×2 IMPLANT
SAW OSC TIP CART 19.5X105X1.3 (SAW) ×2 IMPLANT
SEALER BIPOLAR AQUA 6.0 (INSTRUMENTS) ×2 IMPLANT
SET HNDPC FAN SPRY TIP SCT (DISPOSABLE) ×1 IMPLANT
SET PAD KNEE POSITIONER (MISCELLANEOUS) ×2 IMPLANT
SPONGE DRAIN TRACH 4X4 STRL 2S (GAUZE/BANDAGES/DRESSINGS) IMPLANT
SUT MNCRL AB 3-0 PS2 18 (SUTURE) ×2 IMPLANT
SUT MNCRL AB 4-0 PS2 18 (SUTURE) ×2 IMPLANT
SUT MON AB 2-0 CT1 36 (SUTURE) ×2 IMPLANT
SUT STRATAFIX PDO 1 14 VIOLET (SUTURE) ×2
SUT STRATFX PDO 1 14 VIOLET (SUTURE) ×1
SUT VIC AB 1 CTX 36 (SUTURE) ×4
SUT VIC AB 1 CTX36XBRD ANBCTR (SUTURE) ×2 IMPLANT
SUT VIC AB 2-0 CT1 27 (SUTURE) ×2
SUT VIC AB 2-0 CT1 TAPERPNT 27 (SUTURE) ×1 IMPLANT
SUTURE STRATFX PDO 1 14 VIOLET (SUTURE) ×1 IMPLANT
SYR 3ML LL SCALE MARK (SYRINGE) ×2 IMPLANT
TIBIA BEAR INSERT KNEE SZ4 9 (Insert) ×2 IMPLANT
TOWER CARTRIDGE SMART MIX (DISPOSABLE) IMPLANT
TRAY FOLEY MTR SLVR 16FR STAT (SET/KITS/TRAYS/PACK) IMPLANT
TUBE SUCTION HIGH CAP CLEAR NV (SUCTIONS) ×2 IMPLANT
WATER STERILE IRR 1000ML POUR (IV SOLUTION) ×4 IMPLANT

## 2021-04-07 NOTE — Transfer of Care (Signed)
Immediate Anesthesia Transfer of Care Note  Patient: Christine Burns  Procedure(s) Performed: COMPUTER ASSISTED TOTAL KNEE ARTHROPLASTY (Left: Knee)  Patient Location: PACU  Anesthesia Type:Spinal  Level of Consciousness: awake, alert , oriented and patient cooperative  Airway & Oxygen Therapy: Patient Spontanous Breathing and Patient connected to face mask  Post-op Assessment: Report given to RN and Post -op Vital signs reviewed and stable  Post vital signs: Reviewed and stable  Last Vitals:  Vitals Value Taken Time  BP    Temp    Pulse    Resp    SpO2      Last Pain:  Vitals:   04/07/21 0633  TempSrc:   PainSc: 0-No pain      Patients Stated Pain Goal: 4 (04/07/21 4270)  Complications: No notable events documented.

## 2021-04-07 NOTE — Evaluation (Signed)
Physical Therapy Evaluation Patient Details Name: Christine Burns MRN: [id number] DOB: [birth year] Today's Date: 04/07/2021  History of Present Illness  Patient is 75 y.o. female s/p Lt TKA on 04/07/21 with PMH significant for HTN, hypothyroidism, fibromyalgia, CKD, OA, anemia, anxiety, depression, back fusion.    Clinical Impression  Christine Burns is a 75 y.o. female POD 0 s/p Lt TKA. Patient reports independence with mobility at baseline. Patient is now limited by functional impairments (see PT problem list below) and requires min guard for transfers and gait with RW. Patient was able to ambulate ~50 feet with RW and min guard assist. Patient instructed in exercise to facilitate circulation to manage edema and reduce risk of DVT. Patient will benefit from continued skilled PT interventions to address impairments and progress towards PLOF. Acute PT will follow to progress mobility and stair training in preparation for safe discharge home.        Recommendations for follow up therapy are one component of a multi-disciplinary discharge planning process, led by the attending physician.  Recommendations may be updated based on patient status, additional functional criteria and insurance authorization.  Follow Up Recommendations Follow surgeon's recommendation for DC plan and follow-up therapies    Equipment Recommendations  None recommended by PT    Recommendations for Other Services       Precautions / Restrictions Precautions Precautions: Fall Restrictions Weight Bearing Restrictions: No Other Position/Activity Restrictions: WBAT      Mobility  Bed Mobility Overal bed mobility: Needs Assistance Bed Mobility: Supine to Sit     Supine to sit: Supervision     General bed mobility comments: no assist needed, pt taking extra time and use of bed rail    Transfers Overall transfer level: Needs assistance Equipment used: Rolling walker (2 wheeled) Transfers: Sit to/from Stand Sit to  Stand: Min guard         General transfer comment: cues for safe technique/hand placement, no assist to rise. pt steady with standing  Ambulation/Gait Ambulation/Gait assistance: Min guard Gait Distance (Feet): 50 Feet Assistive device: Rolling walker (2 wheeled) Gait Pattern/deviations: Step-to pattern;Decreased stride length Gait velocity: decr   General Gait Details: cues for step to pattern, pt maintained safe proximity to RW throughout with min guard for safety  Stairs            Wheelchair Mobility    Modified Rankin (Stroke Patients Only)       Balance Overall balance assessment: Mild deficits observed, not formally tested                                           Pertinent Vitals/Pain Pain Assessment: Faces Faces Pain Scale: Hurts a little bit Pain Location: Lt knee Pain Descriptors / Indicators: Aching;Discomfort Pain Intervention(s): Limited activity within patient's tolerance;Monitored during session;Repositioned;Ice applied    Home Living Family/patient expects to be discharged to:: Private residence Living Arrangements: Other relatives Available Help at Discharge: Family Type of Home: Apartment Home Access: Level entry     Home Layout: One level Home Equipment: Environmental consultant - 2 wheels;Shower seat;Cane - single point;Bedside commode Additional Comments: pt lives with granddaughter (15)    Prior Function Level of Independence: Independent         Comments: pt denies using device for mobility; is independent with drivning, shopping, cooking, cleaning, and ADL's     Hand Dominance   Dominant Hand:  Right    Extremity/Trunk Assessment   Upper Extremity Assessment Upper Extremity Assessment: Overall WFL for tasks assessed    Lower Extremity Assessment Lower Extremity Assessment: LLE deficits/detail LLE Deficits / Details: good quad acitvation, no extensor lag with SLR LLE Sensation: WNL LLE Coordination: WNL    Cervical  / Trunk Assessment Cervical / Trunk Assessment: Normal  Communication   Communication: No difficulties  Cognition Arousal/Alertness: Awake/alert Behavior During Therapy: WFL for tasks assessed/performed Overall Cognitive Status: Within Functional Limits for tasks assessed                                        General Comments      Exercises Total Joint Exercises Ankle Circles/Pumps: AROM;Both;20 reps;Seated   Assessment/Plan    PT Assessment Patient needs continued PT services  PT Problem List Decreased strength;Decreased range of motion;Decreased activity tolerance;Decreased balance;Decreased mobility;Decreased knowledge of use of DME;Decreased knowledge of precautions       PT Treatment Interventions DME instruction;Gait training;Stair training;Functional mobility training;Therapeutic activities;Therapeutic exercise;Balance training;Patient/family education    PT Goals (Current goals can be found in the Care Plan section)  Acute Rehab PT Goals Patient Stated Goal: regain independence PT Goal Formulation: With patient Time For Goal Achievement: 04/14/21 Potential to Achieve Goals: Good    Frequency 7X/week   Barriers to discharge        Co-evaluation               AM-PAC PT "6 Clicks" Mobility  Outcome Measure Help needed turning from your back to your side while in a flat bed without using bedrails?: None Help needed moving from lying on your back to sitting on the side of a flat bed without using bedrails?: A Little Help needed moving to and from a bed to a chair (including a wheelchair)?: A Little Help needed standing up from a chair using your arms (e.g., wheelchair or bedside chair)?: A Little Help needed to walk in hospital room?: A Little Help needed climbing 3-5 steps with a railing? : A Little 6 Click Score: 19    End of Session Equipment Utilized During Treatment: Gait belt Activity Tolerance: Patient tolerated treatment  well Patient left: in chair;with call bell/phone within reach;with chair alarm set;with family/visitor present Nurse Communication: Mobility status PT Visit Diagnosis: Muscle weakness (generalized) (M62.81);Difficulty in walking, not elsewhere classified (R26.2)    Time: 4967-5916 PT Time Calculation (min) (ACUTE ONLY): 21 min   Charges:   PT Evaluation $PT Eval Low Complexity: 1 Low          Wynn Maudlin, DPT Acute Rehabilitation Services Office [phone] Pager [phone]    Anitra Lauth 04/07/2021, 6:19 PM

## 2021-04-07 NOTE — Anesthesia Procedure Notes (Addendum)
Anesthesia Regional Block: Adductor canal block   Pre-Anesthetic Checklist: , timeout performed,  Correct Patient, Correct Site, Correct Laterality,  Correct Procedure, Correct Position, site marked,  Risks and benefits discussed,  Surgical consent,  Pre-op evaluation,  At surgeon's request and post-op pain management  Laterality: Lower and Left  Prep: chloraprep       Needles:  Injection technique: Single-shot  Needle Type: Echogenic Needle     Needle Length: 9cm  Needle Gauge: 22     Additional Needles:   Procedures:,,,, ultrasound used (permanent image in chart),,    Narrative:  Start time: 04/07/2021 7:57 AM End time: 04/07/2021 8:03 AM Injection made incrementally with aspirations every 5 mL.  Performed by: Personally  Anesthesiologist: Trevor Iha, MD  Additional Notes: Block assessed prior to surgery. Pt tolerated procedure well.

## 2021-04-07 NOTE — Care Plan (Signed)
Ortho Bundle Case Management Note  Patient Details  Name: Christine Burns MRN: [id number] Date of Birth: 12-07-1945                  L TKA on 04-07-21 DCP: home with granddtr. 1 story home with 0 ste. DME: No needs. Has a RW and 3-in-1 PT: [facility] 9/19   DME Arranged:  N/A DME Agency:     HH Arranged:    HH Agency:     Additional Comments: Please contact me with any questions of if this plan should need to change.  Ennis Forts, RN,CCM [facility]  [phone] 04/07/2021, 9:22 AM

## 2021-04-07 NOTE — Op Note (Signed)
OPERATIVE REPORT  SURGEON: Rod Can, MD   ASSISTANT: Nehemiah Massed, PA-C  PREOPERATIVE DIAGNOSIS: Left knee arthritis.   POSTOPERATIVE DIAGNOSIS: Left knee arthritis.   PROCEDURE: Left total knee arthroplasty.   IMPLANTS: Stryker Triathlon CR femur, size 4. Stryker Tritanium tibia, size 4. X3 polyethelyene insert, size 9 mm, CR. 3 button asymmetric patella, size 35 mm.  ANESTHESIA:  MAC, Regional, and Spinal  TOURNIQUET TIME: Not utilized.   ESTIMATED BLOOD LOSS:-150 mL    ANTIBIOTICS: 2g Ancef.  DRAINS: None.  COMPLICATIONS: None   CONDITION: PACU - hemodynamically stable.   BRIEF CLINICAL NOTE: Christine Burns is a 75 y.o. female with a long-standing history of Left knee arthritis. After failing conservative management, the patient was indicated for total knee arthroplasty. The risks, benefits, and alternatives to the procedure were explained, and the patient elected to proceed.  PROCEDURE IN DETAIL: Adductor canal block was obtained in the pre-op holding area. Once inside the operative room, spinal anesthesia was obtained, and a foley catheter was inserted. The patient was then positioned and the lower extremity was prepped and draped in the normal sterile surgical fashion.  A time-out was called verifying side and site of surgery. The patient received IV antibiotics within 60 minutes of beginning the procedure. A tourniquet was not utilized.   An anterior approach to the knee was performed utilizing a midvastus arthrotomy. A medial release was performed and the patellar fat pad was excised. Stryker imageless navigation was used to cut the distal femur perpendicular to the mechanical axis. A freehand patellar resection was performed, and the patella was sized and prepared with 3 lug holes.  Nagivation was used to make a neutral proximal tibia resection, taking 9 mm of bone  from the less affected lateral side with 3 degrees of slope. The menisci were excised. A spacer block was placed, and the alignment and balance in extension were confirmed.   The distal femur was sized using the 3-degree external rotation guide referencing the posterior femoral cortex. The appropriate 4-in-1 cutting block was pinned into place. Rotation was checked using Whiteside's line, the epicondylar axis, and then confirmed with a spacer block in flexion. The remaining femoral cuts were performed, taking care to protect the MCL.  The tibia was sized and the trial tray was pinned into place. The remaining trail components were inserted. The knee was stable to varus and valgus stress through a full range of motion. The patella tracked centrally, and the PCL was well balanced. The trial components were removed, and the proximal tibial surface was prepared. Final components were impacted into place. The knee was tested for a final time and found to be well balanced.   The wound was copiously irrigated with Irrisept solution and normal saline using pule lavage.  Marcaine solution was injected into the periarticular soft tissue.  The wound was closed in layers using #1 Vicryl and Stratafix for the fascia, 2-0 Vicryl for the subcutaneous fat, 2-0 Monocryl for the deep dermal layer, 3-0 running Monocryl subcuticular Stitch, and 4-0 Monocryl stay sutures at both ends of the wound. Dermabond was applied to the skin.  Once the glue was fully dried, an Aquacell Ag and compressive dressing were applied.  Tthe patient was transported to the recovery room in stable condition.  Sponge, needle, and instrument counts were correct at the end of the case x2.  The patient tolerated the procedure well and there were no known complications.  Please note that a surgical assistant was a  medical necessity for this procedure in order to perform it in a safe and expeditious manner. Surgical assistant was necessary to retract the  ligaments and vital neurovascular structures to prevent injury to them and also necessary for proper positioning of the limb to allow for anatomic placement of the prosthesis.

## 2021-04-07 NOTE — Plan of Care (Signed)
?  Problem: Pain Management: ?Goal: Pain level will decrease with appropriate interventions ?Outcome: Progressing ?  ?Problem: Activity: ?Goal: Ability to avoid complications of mobility impairment will improve ?Outcome: Progressing ?Goal: Range of joint motion will improve ?Outcome: Progressing ?  ?

## 2021-04-07 NOTE — Discharge Instructions (Signed)
 Dr. Camdyn Laden Total Joint Specialist [facility] [address] , [place] [address] [phone]  TOTAL KNEE REPLACEMENT POSTOPERATIVE DIRECTIONS    Knee Rehabilitation, Guidelines Following Surgery  Results after knee surgery are often greatly improved when you follow the exercise, range of motion and muscle strengthening exercises prescribed by your doctor. Safety measures are also important to protect the knee from further injury. Any time any of these exercises cause you to have increased pain or swelling in your knee joint, decrease the amount until you are comfortable again and slowly increase them. If you have problems or questions, call your caregiver or physical therapist for advice.   WEIGHT BEARING Weight bearing as tolerated with assist device (walker, cane, etc) as directed, use it as long as suggested by your surgeon or therapist, typically at least 4-6 weeks.  HOME CARE INSTRUCTIONS  Remove items at home which could result in a fall. This includes throw rugs or furniture in walking pathways.  Continue medications as instructed at time of discharge. You may have some home medications which will be placed on hold until you complete the course of blood thinner medication.  You may start showering once you are discharged home but do not submerge the incision under water. Just pat the incision dry and apply a dry gauze dressing on daily. Walk with walker as instructed.  You may resume a sexual relationship in one month or when given the OK by your doctor.  Use walker as long as suggested by your caregivers. Avoid periods of inactivity such as sitting longer than an hour when not asleep. This helps prevent blood clots.  You may put full weight on your legs and walk as much as is comfortable.  You may return to work once you are cleared by your doctor.  Do not drive a car for 6 weeks or until released by you surgeon.  Do not drive while  taking narcotics.  Wear the elastic stockings for three weeks following surgery during the day but you may remove then at night. Make sure you keep all of your appointments after your operation with all of your doctors and caregivers. You should call the office at the above phone number and make an appointment for approximately two weeks after the date of your surgery. Do not remove your surgical dressing. The dressing is waterproof; you may take showers in 3 days, but do not take tub baths or submerge the dressing. Please pick up a stool softener and laxative for home use as long as you are requiring pain medications. ICE to the affected knee every three hours for 30 minutes at a time and then as needed for pain and swelling.  Continue to use ice on the knee for pain and swelling from surgery. You may notice swelling that will progress down to the foot and ankle.  This is normal after surgery.  Elevate the leg when you are not up walking on it.   It is important for you to complete the blood thinner medication as prescribed by your doctor. Continue to use the breathing machine which will help keep your temperature down.  It is common for your temperature to cycle up and down following surgery, especially at night when you are not up moving around and exerting yourself.  The breathing machine keeps your lungs expanded and your temperature down.  RANGE OF MOTION AND STRENGTHENING EXERCISES  Rehabilitation of the knee is important following a knee injury or an   operation. After just a few days of immobilization, the muscles of the thigh which control the knee become weakened and shrink (atrophy). Knee exercises are designed to build up the tone and strength of the thigh muscles and to improve knee motion. Often times heat used for twenty to thirty minutes before working out will loosen up your tissues and help with improving the range of motion but do not use heat for the first two weeks following surgery.  These exercises can be done on a training (exercise) mat, on the floor, on a table or on a bed. Use what ever works the best and is most comfortable for you Knee exercises include:  Leg Lifts - While your knee is still immobilized in a splint or cast, you can do straight leg raises. Lift the leg to 60 degrees, hold for 3 sec, and slowly lower the leg. Repeat 10-20 times 2-3 times daily. Perform this exercise against resistance later as your knee gets better.  Quad and Hamstring Sets - Tighten up the muscle on the front of the thigh (Quad) and hold for 5-10 sec. Repeat this 10-20 times hourly. Hamstring sets are done by pushing the foot backward against an object and holding for 5-10 sec. Repeat as with quad sets.  A rehabilitation program following serious knee injuries can speed recovery and prevent re-injury in the future due to weakened muscles. Contact your doctor or a physical therapist for more information on knee rehabilitation.   POST-OPERATIVE OPIOID TAPER INSTRUCTIONS: It is important to wean off of your opioid medication as soon as possible. If you do not need pain medication after your surgery it is ok to stop day one. Opioids include: Codeine, Hydrocodone(Norco, Vicodin), Oxycodone(Percocet, oxycontin) and hydromorphone amongst others.  Long term and even short term use of opiods can cause: Increased pain response Dependence Constipation Depression Respiratory depression And more.  Withdrawal symptoms can include Flu like symptoms Nausea, vomiting And more Techniques to manage these symptoms Hydrate well Eat regular healthy meals Stay active Use relaxation techniques(deep breathing, meditating, yoga) Do Not substitute Alcohol to help with tapering If you have been on opioids for less than two weeks and do not have pain than it is ok to stop all together.  Plan to wean off of opioids This plan should start within one week post op of your joint replacement. Maintain the same  interval or time between taking each dose and first decrease the dose.  Cut the total daily intake of opioids by one tablet each day Next start to increase the time between doses. The last dose that should be eliminated is the evening dose.    SKILLED REHAB INSTRUCTIONS: If the patient is transferred to a skilled rehab facility following release from the hospital, a list of the current medications will be sent to the facility for the patient to continue.  When discharged from the skilled rehab facility, please have the facility set up the patient's Home Health Physical Therapy prior to being released. Also, the skilled facility will be responsible for providing the patient with their medications at time of release from the facility to include their pain medication, the muscle relaxants, and their blood thinner medication. If the patient is still at the rehab facility at time of the two week follow up appointment, the skilled rehab facility will also need to assist the patient in arranging follow up appointment in our office and any transportation needs.  MAKE SURE YOU:  Understand these instructions.  Will watch   your condition.  Will get help right away if you are not doing well or get worse.    Pick up stool softner and laxative for home use following surgery while on pain medications. Do NOT remove your dressing. You may shower.  Do not take tub baths or submerge incision under water. May shower starting three days after surgery. Please use a clean towel to pat the incision dry following showers. Continue to use ice for pain and swelling after surgery. Do not use any lotions or creams on the incision until instructed by your surgeon.  

## 2021-04-07 NOTE — Anesthesia Postprocedure Evaluation (Signed)
Anesthesia Post Note  Patient: Christine Burns  Procedure(s) Performed: COMPUTER ASSISTED TOTAL KNEE ARTHROPLASTY (Left: Knee)     Anesthesia Type: Spinal Anesthetic complications: no   No notable events documented.  Last Vitals:  Vitals:   04/07/21 1215 04/07/21 1230  BP: (!) 156/120 (!) 157/97  Pulse: (!) 59 62  Resp: 16 18  Temp:    SpO2: 93% 98%    Last Pain:  Vitals:   04/07/21 1240  TempSrc:   PainSc: 7                  Trevor Iha

## 2021-04-07 NOTE — Interval H&P Note (Signed)
History and Physical Interval Note:  04/07/2021 8:11 AM  Christine Burns  has presented today for surgery, with the diagnosis of Left knee osteoarthritis.  The various methods of treatment have been discussed with the patient and family. After consideration of risks, benefits and other options for treatment, the patient has consented to  Procedure(s): COMPUTER ASSISTED TOTAL KNEE ARTHROPLASTY (Left) as a surgical intervention.  The patient's history has been reviewed, patient examined, no change in status, stable for surgery.  I have reviewed the patient's chart and labs.  Questions were answered to the patient's satisfaction.     Iline Oven Ludwika Rodd

## 2021-04-07 NOTE — Anesthesia Procedure Notes (Signed)
Spinal  Patient location during procedure: OR Start time: 04/07/2021 8:35 AM End time: 04/07/2021 8:40 AM Reason for block: surgical anesthesia Staffing Anesthesiologist: Trevor Iha, MD Preanesthetic Checklist Completed: patient identified, IV checked, site marked, risks and benefits discussed, surgical consent, monitors and equipment checked, pre-op evaluation and timeout performed Spinal Block Patient position: sitting Prep: DuraPrep Patient monitoring: heart rate, cardiac monitor, continuous pulse ox and blood pressure Approach: midline Location: L2-3 Injection technique: single-shot Needle Needle type: Sprotte  Needle gauge: 24 G Needle length: 9 cm Needle insertion depth: 6 cm Assessment Sensory level: T4 Events: CSF return Additional Notes 1 Attempt pt tolerated procedure well.

## 2021-04-08 DIAGNOSIS — F419 Anxiety disorder, unspecified: Secondary | ICD-10-CM | POA: Diagnosis not present

## 2021-04-08 DIAGNOSIS — Z885 Allergy status to narcotic agent status: Secondary | ICD-10-CM | POA: Diagnosis not present

## 2021-04-08 DIAGNOSIS — M797 Fibromyalgia: Secondary | ICD-10-CM | POA: Diagnosis not present

## 2021-04-08 DIAGNOSIS — E039 Hypothyroidism, unspecified: Secondary | ICD-10-CM | POA: Diagnosis not present

## 2021-04-08 DIAGNOSIS — Z888 Allergy status to other drugs, medicaments and biological substances status: Secondary | ICD-10-CM | POA: Diagnosis not present

## 2021-04-08 DIAGNOSIS — I10 Essential (primary) hypertension: Secondary | ICD-10-CM | POA: Diagnosis not present

## 2021-04-08 DIAGNOSIS — M1712 Unilateral primary osteoarthritis, left knee: Secondary | ICD-10-CM | POA: Diagnosis not present

## 2021-04-08 DIAGNOSIS — Z20822 Contact with and (suspected) exposure to covid-19: Secondary | ICD-10-CM | POA: Diagnosis not present

## 2021-04-08 DIAGNOSIS — Z90710 Acquired absence of both cervix and uterus: Secondary | ICD-10-CM | POA: Diagnosis not present

## 2021-04-08 LAB — CBC
HCT: 26.6 % — ABNORMAL LOW (ref 36.0–46.0)
Hemoglobin: 8.6 g/dL — ABNORMAL LOW (ref 12.0–15.0)
MCH: 29.1 pg (ref 26.0–34.0)
MCHC: 32.3 g/dL (ref 30.0–36.0)
MCV: 89.9 fL (ref 80.0–100.0)
Platelets: 148 10*3/uL — ABNORMAL LOW (ref 150–400)
RBC: 2.96 MIL/uL — ABNORMAL LOW (ref 3.87–5.11)
RDW: 13.6 % (ref 11.5–15.5)
WBC: 8.5 10*3/uL (ref 4.0–10.5)
nRBC: 0 % (ref 0.0–0.2)

## 2021-04-08 LAB — BASIC METABOLIC PANEL
Anion gap: 9 (ref 5–15)
BUN: 23 mg/dL (ref 8–23)
CO2: 25 mmol/L (ref 22–32)
Calcium: 8.2 mg/dL — ABNORMAL LOW (ref 8.9–10.3)
Chloride: 104 mmol/L (ref 98–111)
Creatinine, Ser: 1.41 mg/dL — ABNORMAL HIGH (ref 0.44–1.00)
GFR, Estimated: 39 mL/min — ABNORMAL LOW (ref >60)
Glucose, Bld: 150 mg/dL — ABNORMAL HIGH (ref 70–99)
Potassium: 3.6 mmol/L (ref 3.5–5.1)
Sodium: 138 mmol/L (ref 135–145)

## 2021-04-08 MED ORDER — HYDROMORPHONE HCL 2 MG PO TABS
2.0000 mg | ORAL_TABLET | ORAL | Status: DC | PRN
  Administered 2021-04-08 – 2021-04-09 (×5): 2 mg via ORAL
  Filled 2021-04-08 (×5): qty 1

## 2021-04-08 MED ORDER — HYDROMORPHONE HCL 1 MG/ML IJ SOLN
1.0000 mg | INTRAMUSCULAR | Status: DC | PRN
  Administered 2021-04-08 – 2021-04-09 (×6): 2 mg via INTRAVENOUS
  Filled 2021-04-08 (×6): qty 2

## 2021-04-08 NOTE — TOC Transition Note (Signed)
Transition of Care [facility]) - CM/SW Discharge Note   Patient Details  Name: Christine Burns MRN: [id number] Date of Birth: 28-Sep-1945  [facility] of [facility]) CM/SW Contact:  Lennart Pall, LCSW Phone Number: 04/08/2021, 11:23 AM   Clinical Narrative:    Met with pt and confirming she has needed DME at home. OPPT arranged at [facility] in [place].  No TOC needs.   Final next level of care: OP Rehab Barriers to Discharge: No Barriers Identified   Patient Goals and CMS Choice Patient states their goals for this hospitalization and ongoing recovery are:: return home      Discharge Placement                       Discharge Plan and Services                DME Arranged: N/A                    Social Determinants of Health (SDOH) Interventions     Readmission Risk Interventions No flowsheet data found.

## 2021-04-08 NOTE — Progress Notes (Signed)
PT Cancellation Note  Patient Details Name: Christine Burns MRN: [id number] DOB: [birth year]   Cancelled Treatment:    Reason Eval/Treat Not Completed: Pain limiting ability to participate (Pt recently returned to bed and resting with 10/10 pain. Attempted to edcuate on exercsises for ROM and on precautions to maintain Lt knee straight while resting but pain VERY limiting. Will follow up at later date/time as pt is able and schedule allows.)  Wynn Maudlin, DPT Acute Rehabilitation Services Office [phone] Pager [phone]

## 2021-04-08 NOTE — Progress Notes (Signed)
Physical Therapy Treatment Patient Details Name: Christine Burns MRN: [id number] DOB: 10-10-1945 Today's Date: 04/08/2021   History of Present Illness Patient is 75 y.o. female s/p Lt TKA on 04/07/21 with PMH significant for HTN, hypothyroidism, fibromyalgia, CKD, OA, anemia, anxiety, depression, back fusion.    PT Comments    Patient very limited this date due to pain overnight. Pt agreeable to OOB activity to move to recliner but no greater ambulation distance completed today. Instructed pt on simple exercises for ROM from HEP and on ankle pumps for circulation. She will benefit from additional therapy to progress mobility as able.    Recommendations for follow up therapy are one component of a multi-disciplinary discharge planning process, led by the attending physician.  Recommendations may be updated based on patient status, additional functional criteria and insurance authorization.  Follow Up Recommendations  Follow surgeon's recommendation for DC plan and follow-up therapies     Equipment Recommendations  None recommended by PT    Recommendations for Other Services       Precautions / Restrictions Precautions Precautions: Fall Restrictions Weight Bearing Restrictions: No Other Position/Activity Restrictions: WBAT     Mobility  Bed Mobility Overal bed mobility: Needs Assistance Bed Mobility: Supine to Sit     Supine to sit: Min assist;HOB elevated     General bed mobility comments: assist for Lt LE off EOB and cues to scoot to edge for stand.    Transfers Overall transfer level: Needs assistance Equipment used: Rolling walker (2 wheeled) Transfers: Sit to/from [employer] Sit to Stand: Min guard;Min assist Stand pivot transfers: Min assist       General transfer comment: cues for hand placement to improve power up, assist to complete rise/steady as pt avoiding WB on Lt LE this session. Min assist to manage walker with stand step transfer to move  bed>chair.  Ambulation/Gait                 Stairs             Wheelchair Mobility    Modified Rankin (Stroke Patients Only)       Balance Overall balance assessment: Mild deficits observed, not formally tested                                          Cognition Arousal/Alertness: Awake/alert Behavior During Therapy: WFL for tasks assessed/performed Overall Cognitive Status: Within Functional Limits for tasks assessed                                        Exercises Total Joint Exercises Ankle Circles/Pumps: AROM;Both;20 reps;Seated    General Comments        Pertinent Vitals/Pain Pain Assessment: 0-10 Pain Score: 10-Worst pain ever Faces Pain Scale: Hurts a little bit Pain Location: Lt knee Pain Descriptors / Indicators: Aching;Discomfort Pain Intervention(s): Limited activity within patient's tolerance;Monitored during session;Repositioned;Premedicated before session    Home Living                      Prior Function            PT Goals (current goals can now be found in the care plan section) Acute Rehab PT Goals Patient Stated Goal: regain independence PT Goal Formulation: With patient Time  For Goal Achievement: 04/14/21 Potential to Achieve Goals: Good Progress towards PT goals: Progressing toward goals (limited by pain today)    Frequency    7X/week      PT Plan Current plan remains appropriate    Co-evaluation              AM-PAC PT "6 Clicks" Mobility   Outcome Measure  Help needed turning from your back to your side while in a flat bed without using bedrails?: A Little Help needed moving from lying on your back to sitting on the side of a flat bed without using bedrails?: A Little Help needed moving to and from a bed to a chair (including a wheelchair)?: A Little Help needed standing up from a chair using your arms (e.g., wheelchair or bedside chair)?: A Little Help needed  to walk in hospital room?: A Little Help needed climbing 3-5 steps with a railing? : A Little 6 Click Score: 18    End of Session Equipment Utilized During Treatment: Gait belt Activity Tolerance: Patient tolerated treatment well Patient left: in chair;with call bell/phone within reach;with chair alarm set;with family/visitor present Nurse Communication: Mobility status PT Visit Diagnosis: Muscle weakness (generalized) (M62.81);Difficulty in walking, not elsewhere classified (R26.2)     Time: 3875-6433 PT Time Calculation (min) (ACUTE ONLY): 14 min  Charges:  $Therapeutic Activity: 8-22 mins                     Wynn Maudlin, DPT Acute Rehabilitation Services Office [phone] Pager [phone]     Anitra Lauth 04/08/2021, 11:40 AM

## 2021-04-08 NOTE — Progress Notes (Signed)
    Subjective:  Patient reports pain as moderate to severe.  Denies N/V/CP/SOB. C/o poor pain control with PT  Objective:   VITALS:   Vitals:   04/08/21 0111 04/08/21 0623 04/08/21 1107 04/08/21 1426  BP: (!) 143/66 (!) 181/99 (!) 188/86 (!) 184/96  Pulse: 72 76 73 82  Resp: 16 18 18 16   Temp: 98.3 F (36.8 C) 99.1 F (37.3 C) 99.4 F (37.4 C) 99.8 F (37.7 C)  TempSrc: Oral Oral Oral Oral  SpO2: 97% 98% 92% 98%  Weight:      Height:        NAD ABD soft Sensation intact distally Intact pulses distally Dorsiflexion/Plantar flexion intact Incision: dressing C/D/I Compartment soft   Lab Results  Component Value Date   WBC 8.5 04/08/2021   HGB 8.6 (L) 04/08/2021   HCT 26.6 (L) 04/08/2021   MCV 89.9 04/08/2021   PLT 148 (L) 04/08/2021   BMET    Component Value Date/Time   NA 138 04/08/2021 0304   K 3.6 04/08/2021 0304   CL 104 04/08/2021 0304   CO2 25 04/08/2021 0304   GLUCOSE 150 (H) 04/08/2021 0304   BUN 23 04/08/2021 0304   CREATININE 1.41 (H) 04/08/2021 0304   CALCIUM 8.2 (L) 04/08/2021 0304   GFRNONAA 39 (L) 04/08/2021 0304   GFRAA 44 (L) 10/17/2015 2220     Assessment/Plan: 1 Day Post-Op   Principal Problem:   Osteoarthritis of left knee Active Problems:   Degenerative arthritis of left knee   WBAT with walker DVT ppx: Aspirin, SCDs, TEDS PO pain control: change PO to dilaudid PT/OT Dispo: improve pain control & clear PT, probable d/c tomorrow   2221 Javious Hallisey 04/08/2021, 2:49 PM   04/10/2021, MD [phone] [facility] is now [facility]  [place] [address], [address], [place], [place] Phone: [phone] [email or web address] Facebook  [phone]

## 2021-04-08 NOTE — Plan of Care (Signed)
Plan of care reviewed and discussed with the patient. 

## 2021-04-08 NOTE — Plan of Care (Signed)
  Problem: Pain Managment: Goal: General experience of comfort will improve Outcome: Progressing   Problem: Safety: Goal: Ability to remain free from injury will improve Outcome: Progressing   

## 2021-04-09 LAB — CBC
HCT: 32.7 % — ABNORMAL LOW (ref 36.0–46.0)
Hemoglobin: 10.9 g/dL — ABNORMAL LOW (ref 12.0–15.0)
MCH: 29.1 pg (ref 26.0–34.0)
MCHC: 33.3 g/dL (ref 30.0–36.0)
MCV: 87.2 fL (ref 80.0–100.0)
Platelets: 176 10*3/uL (ref 150–400)
RBC: 3.75 MIL/uL — ABNORMAL LOW (ref 3.87–5.11)
RDW: 13.9 % (ref 11.5–15.5)
WBC: 12.7 10*3/uL — ABNORMAL HIGH (ref 4.0–10.5)
nRBC: 0 % (ref 0.0–0.2)

## 2021-04-09 MED ORDER — ASPIRIN 81 MG PO CHEW: 81.0000 mg | CHEWABLE_TABLET | Freq: Two times a day (BID) | ORAL | 0 refills | Status: AC

## 2021-04-09 MED ORDER — HYDROMORPHONE HCL 1 MG/ML IJ SOLN
1.0000 mg | INTRAMUSCULAR | Status: DC | PRN
  Administered 2021-04-10 (×2): 1 mg via INTRAVENOUS
  Filled 2021-04-09 (×2): qty 1

## 2021-04-09 MED ORDER — ONDANSETRON HCL 4 MG PO TABS: 4.0000 mg | ORAL_TABLET | Freq: Four times a day (QID) | ORAL | 0 refills | Status: AC | PRN

## 2021-04-09 MED ORDER — SENNA 8.6 MG PO TABS: 2.0000 | ORAL_TABLET | Freq: Every day | ORAL | 2 refills | Status: AC

## 2021-04-09 MED ORDER — DOCUSATE SODIUM 100 MG PO CAPS: 100.0000 mg | ORAL_CAPSULE | Freq: Two times a day (BID) | ORAL | 2 refills | Status: AC

## 2021-04-09 MED ORDER — HYDROMORPHONE HCL 2 MG PO TABS
4.0000 mg | ORAL_TABLET | ORAL | Status: DC | PRN
  Administered 2021-04-09 – 2021-04-11 (×11): 4 mg via ORAL
  Filled 2021-04-09 (×11): qty 2

## 2021-04-09 MED ORDER — HYDROMORPHONE HCL 4 MG PO TABS: 4.0000 mg | ORAL_TABLET | ORAL | 0 refills | Status: AC | PRN

## 2021-04-09 NOTE — Progress Notes (Signed)
    Subjective:  Patient reports pain as moderate.  Denies N/V/CP/SOB. Improved pain control with PT  Objective:   VITALS:   Vitals:   04/08/21 1426 04/08/21 2137 04/09/21 0605 04/09/21 1329  BP: (!) 184/96 (!) 172/84 (!) 183/99 (!) 167/91  Pulse: 82 79 75 73  Resp: 16 17 16 18   Temp: 99.8 F (37.7 C) 98.6 F (37 C) 98.9 F (37.2 C) 97.9 F (36.6 C)  TempSrc: Oral Oral Oral Oral  SpO2: 98% 94% 94% 95%  Weight:      Height:        NAD ABD soft Sensation intact distally Intact pulses distally Dorsiflexion/Plantar flexion intact Incision: dressing C/D/I Compartment soft   Lab Results  Component Value Date   WBC 12.7 (H) 04/09/2021   HGB 10.9 (L) 04/09/2021   HCT 32.7 (L) 04/09/2021   MCV 87.2 04/09/2021   PLT 176 04/09/2021   BMET    Component Value Date/Time   NA 138 04/08/2021 0304   K 3.6 04/08/2021 0304   CL 104 04/08/2021 0304   CO2 25 04/08/2021 0304   GLUCOSE 150 (H) 04/08/2021 0304   BUN 23 04/08/2021 0304   CREATININE 1.41 (H) 04/08/2021 0304   CALCIUM 8.2 (L) 04/08/2021 0304   GFRNONAA 39 (L) 04/08/2021 0304   GFRAA 44 (L) 10/17/2015 2220     Assessment/Plan: 2 Days Post-Op   Principal Problem:   Osteoarthritis of left knee Active Problems:   Degenerative arthritis of left knee   WBAT with walker DVT ppx: Aspirin, SCDs, TEDS PO pain control: PO dilaudid PT/OT Dispo: d/c home with OPPT after clears PT, probable d/c tomorrow   2221 Keegan Ducey 04/09/2021, 3:34 PM   04/11/2021, MD [phone] [facility] is now [facility]  [place] [address], [place], [place] Phone: [phone] [email or web address] Facebook  [phone]

## 2021-04-09 NOTE — Progress Notes (Signed)
Physical Therapy Treatment Patient Details Name: Christine Burns MRN: [id number] DOB: February 25, 1946 Today's Date: 04/09/2021   History of Present Illness Patient is 75 y.o. female s/p Lt TKA on 04/07/21 with PMH significant for HTN, hypothyroidism, fibromyalgia, CKD, OA, anemia, anxiety, depression, back fusion.    PT Comments    Pt very cooperative this am and with noted improvement in activity tolerance but continues pain limited and fatigued easily.  Pt able to complete therex program for first time and up to ambulate limited distance into hallway.   Recommendations for follow up therapy are one component of a multi-disciplinary discharge planning process, led by the attending physician.  Recommendations may be updated based on patient status, additional functional criteria and insurance authorization.  Follow Up Recommendations  Follow surgeon's recommendation for DC plan and follow-up therapies     Equipment Recommendations  None recommended by PT    Recommendations for Other Services       Precautions / Restrictions Precautions Precautions: Fall Restrictions Weight Bearing Restrictions: No Other Position/Activity Restrictions: WBAT     Mobility  Bed Mobility Overal bed mobility: Needs Assistance Bed Mobility: Supine to Sit     Supine to sit: Min assist;Mod assist     General bed mobility comments: modified roll 2* previous major back surgery    Transfers Overall transfer level: Needs assistance Equipment used: Rolling walker (2 wheeled) Transfers: Sit to/from Stand Sit to Stand: Min assist         General transfer comment: cues for LE management and use of UEs to self assist. Physical assist to bring wt up and fwd and to balance in initial standing  Ambulation/Gait Ambulation/Gait assistance: Min assist;Min guard Gait Distance (Feet): 13 Feet Assistive device: Rolling walker (2 wheeled) Gait Pattern/deviations: Step-to pattern;Decreased step length -  right;Decreased stance time - left;Shuffle;Trunk flexed Gait velocity: decr   General Gait Details: cues for sequence, posture and position from [employer]             Wheelchair Mobility    Modified Rankin (Stroke Patients Only)       Balance Overall balance assessment: Mild deficits observed, not formally tested                                          Cognition Arousal/Alertness: Awake/alert Behavior During Therapy: WFL for tasks assessed/performed Overall Cognitive Status: Within Functional Limits for tasks assessed                                        Exercises Total Joint Exercises Ankle Circles/Pumps: AROM;Both;20 reps;Seated Quad Sets: AROM;Both;10 reps;Supine Heel Slides: AAROM;Left;15 reps;Supine Straight Leg Raises: AAROM;Left;15 reps;Supine Goniometric ROM: AROM L knee -8 - 60    General Comments        Pertinent Vitals/Pain Pain Assessment: 0-10 Pain Score: 6  Pain Location: Lt knee Pain Descriptors / Indicators: Aching;Sore Pain Intervention(s): Limited activity within patient's tolerance;Monitored during session;Premedicated before session;Ice applied    Home Living                      Prior Function            PT Goals (current goals can now be found in the care plan section) Acute Rehab PT Goals Patient Stated  Goal: regain independence PT Goal Formulation: With patient Time For Goal Achievement: 04/14/21 Potential to Achieve Goals: Good Progress towards PT goals: Progressing toward goals    Frequency    7X/week      PT Plan Current plan remains appropriate    Co-evaluation              AM-PAC PT "6 Clicks" Mobility   Outcome Measure  Help needed turning from your back to your side while in a flat bed without using bedrails?: A Little Help needed moving from lying on your back to sitting on the side of a flat bed without using bedrails?: A Little Help needed moving to  and from a bed to a chair (including a wheelchair)?: A Little Help needed standing up from a chair using your arms (e.g., wheelchair or bedside chair)?: A Little Help needed to walk in hospital room?: A Little Help needed climbing 3-5 steps with a railing? : A Little 6 Click Score: 18    End of Session Equipment Utilized During Treatment: Gait belt Activity Tolerance: Patient limited by pain;Patient limited by fatigue Patient left: with call bell/phone within reach;with chair alarm set;with nursing/sitter in room Nurse Communication: Mobility status PT Visit Diagnosis: Muscle weakness (generalized) (M62.81);Difficulty in walking, not elsewhere classified (R26.2)     Time: 1610-9604 PT Time Calculation (min) (ACUTE ONLY): 32 min  Charges:  $Gait Training: 8-22 mins $Therapeutic Exercise: 8-22 mins                     Mauro Kaufmann PT Acute Rehabilitation Services Pager [phone] Office [phone]    Christine Burns 04/09/2021, 11:01 AM

## 2021-04-09 NOTE — Plan of Care (Signed)
Plan of care reviewed and discussed with the patient. 

## 2021-04-09 NOTE — Progress Notes (Signed)
Physical Therapy Treatment Patient Details Name: Christine Burns MRN: [id number] DOB: [birth year] Today's Date: 04/09/2021   History of Present Illness Patient is 75 y.o. female s/p Lt TKA on 04/07/21 with PMH significant for HTN, hypothyroidism, fibromyalgia, CKD, OA, anemia, anxiety, depression, back fusion.    PT Comments    Pt continues cooperative and with continued improvement noted with activity tolerance.  Pt up to ambulate increased distance in hall but with multiple standing rest breaks required to complete task.   Recommendations for follow up therapy are one component of a multi-disciplinary discharge planning process, led by the attending physician.  Recommendations may be updated based on patient status, additional functional criteria and insurance authorization.  Follow Up Recommendations  Follow surgeon's recommendation for DC plan and follow-up therapies     Equipment Recommendations  None recommended by PT    Recommendations for Other Services       Precautions / Restrictions Precautions Precautions: Fall Restrictions Weight Bearing Restrictions: No Other Position/Activity Restrictions: WBAT     Mobility  Bed Mobility Overal bed mobility: Needs Assistance Bed Mobility: Sit to Supine     Supine to sit: Min assist;Mod assist Sit to supine: Min assist;Mod assist   General bed mobility comments: modified roll 2* previous major back surgery    Transfers Overall transfer level: Needs assistance Equipment used: Rolling walker (2 wheeled) Transfers: Sit to/from Stand Sit to Stand: Min assist         General transfer comment: cues for LE management and use of UEs to self assist. Physical assist to bring wt up and fwd and to balance in initial standing  Ambulation/Gait Ambulation/Gait assistance: Min assist;Min guard Gait Distance (Feet): 29 Feet Assistive device: Rolling walker (2 wheeled) Gait Pattern/deviations: Step-to pattern;Decreased step length -  right;Decreased stance time - left;Shuffle;Trunk flexed Gait velocity: decr   General Gait Details: Increased time, multiple standing rest breaks and cues for sequence, posture and position from [employer]             Wheelchair Mobility    Modified Rankin (Stroke Patients Only)       Balance Overall balance assessment: Mild deficits observed, not formally tested                                          Cognition Arousal/Alertness: Awake/alert Behavior During Therapy: WFL for tasks assessed/performed Overall Cognitive Status: Within Functional Limits for tasks assessed                                        Exercises Total Joint Exercises Ankle Circles/Pumps: AROM;Both;20 reps;Seated Quad Sets: AROM;Both;10 reps;Supine Heel Slides: AAROM;Left;15 reps;Supine Straight Leg Raises: AAROM;Left;15 reps;Supine Goniometric ROM: AROM L knee -8 - 60    General Comments        Pertinent Vitals/Pain Pain Assessment: 0-10 Pain Score: 5  Pain Location: Lt knee Pain Descriptors / Indicators: Aching;Sore Pain Intervention(s): Limited activity within patient's tolerance;Monitored during session;Premedicated before session;Ice applied    Home Living                      Prior Function            PT Goals (current goals can now be found in the care plan section) Acute Rehab  PT Goals Patient Stated Goal: regain independence PT Goal Formulation: With patient Time For Goal Achievement: 04/14/21 Potential to Achieve Goals: Good Progress towards PT goals: Progressing toward goals    Frequency    7X/week      PT Plan Current plan remains appropriate    Co-evaluation              AM-PAC PT "6 Clicks" Mobility   Outcome Measure  Help needed turning from your back to your side while in a flat bed without using bedrails?: A Little Help needed moving from lying on your back to sitting on the side of a flat bed  without using bedrails?: A Little Help needed moving to and from a bed to a chair (including a wheelchair)?: A Little Help needed standing up from a chair using your arms (e.g., wheelchair or bedside chair)?: A Little Help needed to walk in hospital room?: A Little Help needed climbing 3-5 steps with a railing? : A Lot 6 Click Score: 17    End of Session Equipment Utilized During Treatment: Gait belt Activity Tolerance: Patient limited by pain;Patient limited by fatigue Patient left: with call bell/phone within reach;with chair alarm set;with nursing/sitter in room Nurse Communication: Mobility status PT Visit Diagnosis: Muscle weakness (generalized) (M62.81);Difficulty in walking, not elsewhere classified (R26.2)     Time: 3762-8315 PT Time Calculation (min) (ACUTE ONLY): 22 min  Charges:  $Gait Training: 8-22 mins $Therapeutic Exercise: 8-22 mins $Therapeutic Activity: 8-22 mins                     Mauro Kaufmann PT Acute Rehabilitation Services Pager [phone] Office [phone]    Christine Burns 04/09/2021, 1:27 PM

## 2021-04-10 ENCOUNTER — Encounter (HOSPITAL_COMMUNITY): Payer: Self-pay | Admitting: Orthopedic Surgery

## 2021-04-10 LAB — CBC
HCT: 32.2 % — ABNORMAL LOW (ref 36.0–46.0)
Hemoglobin: 10.5 g/dL — ABNORMAL LOW (ref 12.0–15.0)
MCH: 28.8 pg (ref 26.0–34.0)
MCHC: 32.6 g/dL (ref 30.0–36.0)
MCV: 88.2 fL (ref 80.0–100.0)
Platelets: 206 10*3/uL (ref 150–400)
RBC: 3.65 MIL/uL — ABNORMAL LOW (ref 3.87–5.11)
RDW: 14.1 % (ref 11.5–15.5)
WBC: 11.9 10*3/uL — ABNORMAL HIGH (ref 4.0–10.5)
nRBC: 0 % (ref 0.0–0.2)

## 2021-04-10 NOTE — Progress Notes (Signed)
Physical Therapy Treatment Patient Details Name: Christine Burns MRN: [id number] DOB: March 27, 1946 Today's Date: 04/10/2021   History of Present Illness Patient is 75 y.o. female s/p Lt TKA on 04/07/21 with PMH significant for HTN, hypothyroidism, fibromyalgia, CKD, OA, anemia, anxiety, depression, back fusion.    PT Comments    Pt very cooperative and with increased activity tolerance noted.  Pt performed therex program with noted increased flex achieved and ambulated increased (but still limited) distance into hall.   Recommendations for follow up therapy are one component of a multi-disciplinary discharge planning process, led by the attending physician.  Recommendations may be updated based on patient status, additional functional criteria and insurance authorization.  Follow Up Recommendations  Home health PT     Equipment Recommendations  None recommended by PT    Recommendations for Other Services       Precautions / Restrictions Precautions Precautions: Fall Restrictions Weight Bearing Restrictions: No Other Position/Activity Restrictions: WBAT     Mobility  Bed Mobility Overal bed mobility: Needs Assistance Bed Mobility: Supine to Sit;Sit to Supine     Supine to sit: Min assist Sit to supine: Min assist;Mod assist   General bed mobility comments: modified roll 2* previous major back surgery    Transfers Overall transfer level: Needs assistance Equipment used: Rolling walker (2 wheeled) Transfers: Sit to/from Stand Sit to Stand: Min assist;Min guard         General transfer comment: cues for LE management and use of UEs to self assist. Physical assist to bring wt up and fwd and to balance in initial standing  Ambulation/Gait Ambulation/Gait assistance: Min guard Gait Distance (Feet): 54 Feet Assistive device: Rolling walker (2 wheeled) Gait Pattern/deviations: Step-to pattern;Decreased step length - right;Decreased stance time - left;Shuffle;Trunk  flexed Gait velocity: decr   General Gait Details: Increased time, multiple standing rest breaks and cues for sequence, posture and position from [employer]             Wheelchair Mobility    Modified Rankin (Stroke Patients Only)       Balance Overall balance assessment: Mild deficits observed, not formally tested                                          Cognition Arousal/Alertness: Awake/alert Behavior During Therapy: WFL for tasks assessed/performed Overall Cognitive Status: Within Functional Limits for tasks assessed                                        Exercises Total Joint Exercises Ankle Circles/Pumps: AROM;Both;20 reps;Seated Quad Sets: AROM;Both;10 reps;Supine Heel Slides: AAROM;Left;15 reps;Supine Straight Leg Raises: AAROM;Left;15 reps;Supine Goniometric ROM: -7 - 70    General Comments        Pertinent Vitals/Pain Pain Assessment: 0-10 Pain Score: 6  Pain Location: Lt knee Pain Descriptors / Indicators: Aching;Sore Pain Intervention(s): Limited activity within patient's tolerance;Monitored during session;Premedicated before session;Ice applied    Home Living                      Prior Function            PT Goals (current goals can now be found in the care plan section) Acute Rehab PT Goals Patient Stated Goal: regain independence PT Goal Formulation:  With patient Time For Goal Achievement: 04/14/21 Potential to Achieve Goals: Good Progress towards PT goals: Progressing toward goals    Frequency    7X/week      PT Plan Discharge plan needs to be updated    Co-evaluation              AM-PAC PT "6 Clicks" Mobility   Outcome Measure  Help needed turning from your back to your side while in a flat bed without using bedrails?: A Little Help needed moving from lying on your back to sitting on the side of a flat bed without using bedrails?: A Little Help needed moving to and from  a bed to a chair (including a wheelchair)?: A Little Help needed standing up from a chair using your arms (e.g., wheelchair or bedside chair)?: A Little Help needed to walk in hospital room?: A Little Help needed climbing 3-5 steps with a railing? : A Lot 6 Click Score: 17    End of Session Equipment Utilized During Treatment: Gait belt Activity Tolerance: Patient tolerated treatment well;Patient limited by pain;Patient limited by fatigue Patient left: with call bell/phone within reach;in bed;with bed alarm set Nurse Communication: Mobility status PT Visit Diagnosis: Muscle weakness (generalized) (M62.81);Difficulty in walking, not elsewhere classified (R26.2)     Time: 6659-9357 PT Time Calculation (min) (ACUTE ONLY): 34 min  Charges:  $Gait Training: 8-22 mins $Therapeutic Exercise: 8-22 mins                     Mauro Kaufmann PT Acute Rehabilitation Services Pager [phone] Office [phone]    Christine Burns 04/10/2021, 4:04 PM

## 2021-04-10 NOTE — Progress Notes (Signed)
Subjective: 3 Days Post-Op Procedure(s) (LRB): COMPUTER ASSISTED TOTAL KNEE ARTHROPLASTY (Left) Patient reports pain as severe.   Reports pain is "terrible"  Objective: Vital signs in last 24 hours: Temp:  [97.9 F (36.6 C)-99.1 F (37.3 C)] 98.5 F (36.9 C) (09/17 0529) Pulse Rate:  [72-80] 77 (09/17 0651) Resp:  [16-18] 16 (09/17 0529) BP: (167-193)/(91-112) 175/102 (09/17 0651) SpO2:  [95 %-97 %] 97 % (09/17 0651)  Intake/Output from previous day: 09/16 0701 - 09/17 0700 In: 120 [P.O.:120] Out: 580 [Urine:580] Intake/Output this shift: No intake/output data recorded.  Recent Labs    04/08/21 0304 04/09/21 0305 04/10/21 0305  HGB 8.6* 10.9* 10.5*   Recent Labs    04/09/21 0305 04/10/21 0305  WBC 12.7* 11.9*  RBC 3.75* 3.65*  HCT 32.7* 32.2*  PLT 176 206   Recent Labs    04/08/21 0304  NA 138  K 3.6  CL 104  CO2 25  BUN 23  CREATININE 1.41*  GLUCOSE 150*  CALCIUM 8.2*   No results for input(s): LABPT, INR in the last 72 hours.  Neurologically intact ABD soft Neurovascular intact Sensation intact distally Intact pulses distally Dorsiflexion/Plantar flexion intact Incision: dressing C/D/I and no drainage No cellulitis present Compartment soft No sign of DVT   Assessment/Plan: 3 Days Post-Op Procedure(s) (LRB): COMPUTER ASSISTED TOTAL KNEE ARTHROPLASTY (Left) Advance diet Up with therapy D/C IV fluids Plan was for D/C today- if she feels her pain is adequately controlled and ready for D/C later today then ok to D/C Please call or text me [phone] if need orders for DC Current pain control: Tylenol, Dilaudid 4mg  PO q3h, Robaxin, Lyrica, Cymbalta ASA for DVT ppx   04/10/2021, 8:55 AM

## 2021-04-10 NOTE — Plan of Care (Signed)
  Problem: Pain Management: Goal: Pain level will decrease with appropriate interventions Outcome: Progressing   Problem: Activity: Goal: Risk for activity intolerance will decrease Outcome: Progressing   

## 2021-04-10 NOTE — Plan of Care (Signed)
  Problem: Education: Goal: Knowledge of the prescribed therapeutic regimen will improve Outcome: Progressing   Problem: Pain Management: Goal: Pain level will decrease with appropriate interventions Outcome: Progressing   Problem: Skin Integrity: Goal: Will show signs of wound healing Outcome: Progressing   

## 2021-04-10 NOTE — Progress Notes (Signed)
Irish Elders notified of need for home health orders.

## 2021-04-10 NOTE — Progress Notes (Signed)
Physical Therapy Treatment Patient Details Name: Christine Burns MRN: [id number] DOB: 1945-10-03 Today's Date: 04/10/2021   History of Present Illness Patient is 75 y.o. female s/p Lt TKA on 04/07/21 with PMH significant for HTN, hypothyroidism, fibromyalgia, CKD, OA, anemia, anxiety, depression, back fusion.    PT Comments    Pt continues cooperative but requiring encouragement and largely pain limited.  Pt states,"if I knew it was going to hurt like this, I would not have had it done".  Recommendations for follow up therapy are one component of a multi-disciplinary discharge planning process, led by the attending physician.  Recommendations may be updated based on patient status, additional functional criteria and insurance authorization.  Follow Up Recommendations  Home health PT     Equipment Recommendations  None recommended by PT    Recommendations for Other Services       Precautions / Restrictions Precautions Precautions: Fall Restrictions Weight Bearing Restrictions: No Other Position/Activity Restrictions: WBAT     Mobility  Bed Mobility Overal bed mobility: Needs Assistance Bed Mobility: Supine to Sit     Supine to sit: Min assist     General bed mobility comments: modified roll 2* previous major back surgery    Transfers Overall transfer level: Needs assistance Equipment used: Rolling walker (2 wheeled) Transfers: Sit to/from Stand Sit to Stand: Min assist;Min guard         General transfer comment: cues for LE management and use of UEs to self assist. Physical assist to bring wt up and fwd and to balance in initial standing  Ambulation/Gait Ambulation/Gait assistance: Min assist;Min guard Gait Distance (Feet): 27 Feet Assistive device: Rolling walker (2 wheeled) Gait Pattern/deviations: Step-to pattern;Decreased step length - right;Decreased stance time - left;Shuffle;Trunk flexed Gait velocity: decr   General Gait Details: Increased time, multiple  standing rest breaks and cues for sequence, posture and position from [employer]             Wheelchair Mobility    Modified Rankin (Stroke Patients Only)       Balance Overall balance assessment: Mild deficits observed, not formally tested                                          Cognition Arousal/Alertness: Awake/alert Behavior During Therapy: WFL for tasks assessed/performed Overall Cognitive Status: Within Functional Limits for tasks assessed                                        Exercises Total Joint Exercises Ankle Circles/Pumps: AROM;Both;20 reps;Seated Quad Sets: AROM;Both;10 reps;Supine Straight Leg Raises: AAROM;Left;15 reps;Supine    General Comments        Pertinent Vitals/Pain Pain Assessment: 0-10 Pain Score: 10-Worst pain ever Faces Pain Scale: Hurts little more Pain Location: Lt knee Pain Descriptors / Indicators: Aching;Sore;Grimacing Pain Intervention(s): Limited activity within patient's tolerance;Monitored during session;Premedicated before session;Ice applied    Home Living                      Prior Function            PT Goals (current goals can now be found in the care plan section) Acute Rehab PT Goals Patient Stated Goal: regain independence PT Goal Formulation: With patient Time For Goal Achievement: 04/14/21  Potential to Achieve Goals: Good Progress towards PT goals: Progressing toward goals    Frequency    7X/week      PT Plan Discharge plan needs to be updated    Co-evaluation              AM-PAC PT "6 Clicks" Mobility   Outcome Measure  Help needed turning from your back to your side while in a flat bed without using bedrails?: A Little Help needed moving from lying on your back to sitting on the side of a flat bed without using bedrails?: A Little Help needed moving to and from a bed to a chair (including a wheelchair)?: A Little Help needed standing up  from a chair using your arms (e.g., wheelchair or bedside chair)?: A Little Help needed to walk in hospital room?: A Little Help needed climbing 3-5 steps with a railing? : A Lot 6 Click Score: 17    End of Session Equipment Utilized During Treatment: Gait belt Activity Tolerance: Patient limited by pain;Patient limited by fatigue Patient left: with call bell/phone within reach;with chair alarm set;in chair Nurse Communication: Mobility status PT Visit Diagnosis: Muscle weakness (generalized) (M62.81);Difficulty in walking, not elsewhere classified (R26.2)     Time: 1610-9604 PT Time Calculation (min) (ACUTE ONLY): 27 min  Charges:  $Gait Training: 8-22 mins $Therapeutic Exercise: 8-22 mins                     Mauro Kaufmann PT Acute Rehabilitation Services Pager [phone] Office [phone]    Isobel Eisenhuth 04/10/2021, 1:37 PM

## 2021-04-10 NOTE — TOC Progression Note (Signed)
Transition of Care [facility]) - Progression Note    Patient Details  Name: [name] MRN: [id number] Date of Birth: September 19, 1945  Transition of [facility]) CM/SW Contact  Armanda Heritage, RN Phone Number: 04/10/2021, 10:40 AM  Clinical Narrative:    CM spoke with charge RN and was informed patient will now need HHPT services at discharge.  Patient set up with services through [facility].  Patient has all necessary dme.   Expected Discharge Plan: Home w Home Health Services Barriers to Discharge: No Barriers Identified  Expected Discharge Plan and Services Expected Discharge Plan: Home w Home Health Services     Post Acute Care Choice: Home Health Living arrangements for the past 2 months: Single Family Home                 DME Arranged: N/A         HH Arranged: PT HH Agency: [facility] Date HH Agency Contacted: 04/10/21 Time HH Agency Contacted: 1040 Representative spoke with at [facility] Agency: Stacie   Social Determinants of Health (SDOH) Interventions    Readmission Risk Interventions No flowsheet data found.

## 2021-04-11 DIAGNOSIS — I10 Essential (primary) hypertension: Secondary | ICD-10-CM | POA: Diagnosis present

## 2021-04-11 DIAGNOSIS — M1712 Unilateral primary osteoarthritis, left knee: Secondary | ICD-10-CM | POA: Diagnosis present

## 2021-04-11 DIAGNOSIS — F419 Anxiety disorder, unspecified: Secondary | ICD-10-CM | POA: Diagnosis present

## 2021-04-11 DIAGNOSIS — Z20822 Contact with and (suspected) exposure to covid-19: Secondary | ICD-10-CM | POA: Diagnosis present

## 2021-04-11 DIAGNOSIS — Z79899 Other long term (current) drug therapy: Secondary | ICD-10-CM | POA: Diagnosis not present

## 2021-04-11 DIAGNOSIS — Z79890 Hormone replacement therapy (postmenopausal): Secondary | ICD-10-CM | POA: Diagnosis not present

## 2021-04-11 DIAGNOSIS — Z90710 Acquired absence of both cervix and uterus: Secondary | ICD-10-CM | POA: Diagnosis not present

## 2021-04-11 DIAGNOSIS — E039 Hypothyroidism, unspecified: Secondary | ICD-10-CM | POA: Diagnosis present

## 2021-04-11 DIAGNOSIS — Z885 Allergy status to narcotic agent status: Secondary | ICD-10-CM | POA: Diagnosis not present

## 2021-04-11 DIAGNOSIS — Z888 Allergy status to other drugs, medicaments and biological substances status: Secondary | ICD-10-CM | POA: Diagnosis not present

## 2021-04-11 DIAGNOSIS — M797 Fibromyalgia: Secondary | ICD-10-CM | POA: Diagnosis present

## 2021-04-11 NOTE — Progress Notes (Signed)
Orthopedics Progress Note  Subjective: Patient reports pain in control. She would like to go home if she clears therapy.   Objective:  Vitals:   04/10/21 2340 04/11/21 0608  BP: (!) 157/96 125/87  Pulse: 80 72  Resp: 17 17  Temp: 99 F (37.2 C) 99 F (37.2 C)  SpO2: 93% 96%    General: Awake and alert  Musculoskeletal: Left knee dressing CDI, compression in place Neurovascularly intact  Lab Results  Component Value Date   WBC 11.9 (H) 04/10/2021   HGB 10.5 (L) 04/10/2021   HCT 32.2 (L) 04/10/2021   MCV 88.2 04/10/2021   PLT 206 04/10/2021       Component Value Date/Time   NA 138 04/08/2021 0304   K 3.6 04/08/2021 0304   CL 104 04/08/2021 0304   CO2 25 04/08/2021 0304   GLUCOSE 150 (H) 04/08/2021 0304   BUN 23 04/08/2021 0304   CREATININE 1.41 (H) 04/08/2021 0304   CALCIUM 8.2 (L) 04/08/2021 0304   GFRNONAA 39 (L) 04/08/2021 0304   GFRAA 44 (L) 10/17/2015 2220    Lab Results  Component Value Date   INR 1.0 03/31/2021    Assessment/Plan: POD #4 s/p Procedure(s): COMPUTER ASSISTED TOTAL KNEE ARTHROPLASTY Stable after TKR. Home after cleared by therapy  Almedia Balls. Ranell Patrick, MD 04/11/2021 9:37 AM

## 2021-04-11 NOTE — Progress Notes (Signed)
Discharge package printed and instructions given to patient. Patient verbalizes understanding. 

## 2021-04-11 NOTE — Discharge Summary (Signed)
In most cases prophylactic antibiotics for Dental procdeures after total joint surgery are not necessary.  Exceptions are as follows:  1. History of prior total joint infection  2. Severely immunocompromised (Organ Transplant, cancer chemotherapy, Rheumatoid biologic meds such as Humera)  3. Poorly controlled diabetes (A1C &gt; 8.0, blood glucose over 200)  If you have one of these conditions, contact your surgeon for an antibiotic prescription, prior to your dental procedure. Orthopedic Discharge Summary        Physician Discharge Summary  Patient ID: Christine Burns MRN: [id number] DOB/AGE: June 07, 1946 75 y.o.  Admit date: 04/07/2021 Discharge date: 04/11/2021   Procedures:  Procedure(s) (LRB): COMPUTER ASSISTED TOTAL KNEE ARTHROPLASTY (Left)  Attending Physician:  Dr. Samson Frederic  Admission Diagnoses:   Left knee OA, end stage  Discharge Diagnoses:  Left knee OA   Past Medical History:  Diagnosis Date   Anemia    Anxiety    Arthritis    back & knees & fingers    Chronic kidney disease    due to increased use of NSAIDS in the past, appt. with [facility] x1, but now is managed by PCP- Nyland     Depression    Fibromyalgia    tremors   Hypertension    Hypothyroidism     PCP: Kirstie Peri, MD   Discharged Condition: good  Hospital Course:  Patient underwent the above stated procedure on 04/07/2021. Patient tolerated the procedure well and brought to the recovery room in good condition and subsequently to the floor. Patient had an uncomplicated hospital course and was stable for discharge.   Disposition: Discharge disposition: 01-Home or Self Care      with follow up in 2 weeks    Follow-up Information     Samson Frederic, MD. Go on 04/26/2021.   Specialty: Orthopedic Surgery Why: You are scheduled for first post op appointment on Monday October 3rd at 2:45pm. Contact information: [address] [place] [address] [phone]          Samson Frederic, MD. Schedule an appointment as soon as possible for a visit in 2 week(s).   Specialty: Orthopedic Surgery Why: For wound re-check Contact information: [address] [place] [address] [phone]         [facility] Follow up.   Specialty: Home Health Services Why: agency will provide home health therapy. Contact information: [address] [place] [address] [phone]                 Dental Antibiotics:  In most cases prophylactic antibiotics for Dental procdeures after total joint surgery are not necessary.  Exceptions are as follows:  1. History of prior total joint infection  2. Severely immunocompromised (Organ Transplant, cancer chemotherapy, Rheumatoid biologic meds such as Humera)  3. Poorly controlled diabetes (A1C &gt; 8.0, blood glucose over 200)  If you have one of these conditions, contact your surgeon for an antibiotic prescription, prior to your dental procedure.  Discharge Instructions     Call MD / Call 911   Complete by: As directed    If you experience chest pain or shortness of breath, CALL 911 and be transported to the hospital emergency room.  If you develope a fever above 101 F, pus (white drainage) or increased drainage or redness at the wound, or calf pain, call your surgeon's office.   Constipation Prevention   Complete by: As directed    Drink plenty of fluids.  Prune juice may be helpful.  You may use a stool softener, such as Colace (over the counter) 100 mg twice a day.  Use MiraLax (over the counter) for constipation as needed.   Diet - low sodium heart healthy   Complete by: As directed    Increase activity slowly as tolerated   Complete by: As directed    Post-operative opioid taper instructions:   Complete by: As directed    POST-OPERATIVE OPIOID TAPER INSTRUCTIONS: It is important to wean off of your opioid medication as soon as possible. If you do not need  pain medication after your surgery it is ok to stop day one. Opioids include: Codeine, Hydrocodone(Norco, Vicodin), Oxycodone(Percocet, oxycontin) and hydromorphone amongst others.  Long term and even short term use of opiods can cause: Increased pain response Dependence Constipation Depression Respiratory depression And more.  Withdrawal symptoms can include Flu like symptoms Nausea, vomiting And more Techniques to manage these symptoms Hydrate well Eat regular healthy meals Stay active Use relaxation techniques(deep breathing, meditating, yoga) Do Not substitute Alcohol to help with tapering If you have been on opioids for less than two weeks and do not have pain than it is ok to stop all together.  Plan to wean off of opioids This plan should start within one week post op of your joint replacement. Maintain the same interval or time between taking each dose and first decrease the dose.  Cut the total daily intake of opioids by one tablet each day Next start to increase the time between doses. The last dose that should be eliminated is the evening dose.          Allergies as of 04/11/2021       Reactions   Fentanyl Hypertension   Ibuprofen Other (See Comments)   Kidney damage   Zolpidem Nausea And Vomiting, Other (See Comments)   Other reaction(s): Confusion (intolerance) "made me go crazy" "made me go crazy" "made me go crazy" "made me go crazy" Other Reaction: Other reaction   Cyclobenzaprine Other (See Comments)   Other reaction(s): Confusion (intolerance), Hallucinations, Other (See Comments) States goes crazy Cognitive changes. States goes crazy States goes crazy States goes crazy Cognitive changes.   Diazepam Other (See Comments)     SLEEP WALKING, hallucinations   Morphine And Related Rash   Other reaction(s): Other (see comments) Headache Headache Other Reaction: Other reaction   Zolpidem Tartrate Nausea And Vomiting   Metaxalone    Meperidine  Nausea Only   Morphine Rash        Medication List     TAKE these medications    amLODipine 2.5 MG tablet Commonly known as: NORVASC Take 2.5 mg by mouth daily.   aspirin 81 MG chewable tablet Chew 1 tablet (81 mg total) by mouth 2 (two) times daily.   B-12 PO Take 1 tablet by mouth daily.   docusate sodium 100 MG capsule Commonly known as: COLACE Take 1 capsule (100 mg total) by mouth 2 (two) times daily.   DULoxetine 60 MG capsule Commonly known as: CYMBALTA Take 60 mg by mouth daily.   DULoxetine 30 MG capsule Commonly known as: CYMBALTA Take 1 capsule (30 mg total) by mouth 2 (two) times daily.   fluticasone 50 MCG/ACT nasal spray Commonly known as: FLONASE Place 1-2 sprays into both nostrils daily as needed for allergies.   HYDROmorphone 4 MG tablet Commonly known as: DILAUDID Take 1 tablet (4 mg total) by mouth every 4 (four) hours as needed for  up to 7 days for severe pain.   IRON PO Take 1 tablet by mouth daily.   levothyroxine 100 MCG tablet Commonly known as: SYNTHROID Take 100 mcg by mouth daily.   lisinopril 2.5 MG tablet Commonly known as: ZESTRIL Take 2.5 mg by mouth daily.   loratadine 10 MG tablet Commonly known as: CLARITIN Take 10 mg by mouth daily as needed for allergies.   methadone 10 MG tablet Commonly known as: DOLOPHINE Take 20 mg by mouth 2 (two) times daily.   nystatin 100000 UNIT/ML suspension Commonly known as: MYCOSTATIN   ondansetron 4 MG tablet Commonly known as: ZOFRAN Take 1 tablet (4 mg total) by mouth every 6 (six) hours as needed for nausea.   pregabalin 100 MG capsule Commonly known as: LYRICA Take 100 mg by mouth daily.   propranolol ER 120 MG 24 hr capsule Commonly known as: INDERAL LA Take 120 mg by mouth daily.   senna 8.6 MG Tabs tablet Commonly known as: SENOKOT Take 2 tablets (17.2 mg total) by mouth at bedtime.   traZODone 100 MG tablet Commonly known as: DESYREL Take 100 mg by mouth at  bedtime.          Signed: Verlee Rossetti 04/11/2021, 9:39 AM  [facility] is now [facility] [address], [place], [place] [address] Phone: [phone] Facebook  Instagram  [employer]

## 2021-04-11 NOTE — Progress Notes (Signed)
Physical Therapy Treatment Patient Details Name: Christine Burns MRN: [id number] DOB: January 11, 1946 Today's Date: 04/11/2021   History of Present Illness Patient is 75 y.o. female s/p Lt TKA on 04/07/21 with PMH significant for HTN, hypothyroidism, fibromyalgia, CKD, OA, anemia, anxiety, depression, back fusion.    PT Comments    Pt in good spirits with marked improvement inactivity tolerance and quality of movement.  Pt performed HEP with assist and up to ambulate increased distance in hall.  Pt eager for dc home with family/friends.   Recommendations for follow up therapy are one component of a multi-disciplinary discharge planning process, led by the attending physician.  Recommendations may be updated based on patient status, additional functional criteria and insurance authorization.  Follow Up Recommendations  Home health PT     Equipment Recommendations  None recommended by PT    Recommendations for Other Services       Precautions / Restrictions Precautions Precautions: Fall Restrictions Weight Bearing Restrictions: No Other Position/Activity Restrictions: WBAT     Mobility  Bed Mobility Overal bed mobility: Needs Assistance Bed Mobility: Supine to Sit     Supine to sit: Supervision     General bed mobility comments: modified roll 2* previous major back surgery    Transfers Overall transfer level: Needs assistance Equipment used: Rolling walker (2 wheeled) Transfers: Sit to/from Stand Sit to Stand: Min guard;Supervision         General transfer comment: min cues for LE management and use of UEs to self assist.  Ambulation/Gait Ambulation/Gait assistance: Min guard;Supervision Gait Distance (Feet): 112 Feet Assistive device: Rolling walker (2 wheeled) Gait Pattern/deviations: Step-to pattern;Decreased step length - right;Decreased stance time - left;Shuffle;Trunk flexed Gait velocity: decr   General Gait Details: Increased time and min cues for sequence,  posture and position from [employer] Mobility    Modified Rankin (Stroke Patients Only)       Balance Overall balance assessment: Mild deficits observed, not formally tested                                          Cognition Arousal/Alertness: Awake/alert Behavior During Therapy: WFL for tasks assessed/performed Overall Cognitive Status: Within Functional Limits for tasks assessed                                        Exercises Total Joint Exercises Ankle Circles/Pumps: AROM;Both;20 reps;Seated Quad Sets: AROM;Both;10 reps;Supine Heel Slides: AAROM;Left;15 reps;Supine Straight Leg Raises: AAROM;Left;15 reps;Supine Goniometric ROM: -7-80 AAROM at L knee    General Comments        Pertinent Vitals/Pain Pain Assessment: 0-10 Pain Score: 5  Pain Location: Lt knee Pain Descriptors / Indicators: Aching;Sore Pain Intervention(s): Limited activity within patient's tolerance;Monitored during session;Premedicated before session;Ice applied    Home Living                      Prior Function            PT Goals (current goals can now be found in the care plan section) Acute Rehab PT Goals Patient Stated Goal: regain independence PT Goal Formulation: With patient Time For Goal Achievement: 04/14/21 Potential to Achieve Goals: Good Progress towards PT  goals: Progressing toward goals    Frequency    7X/week      PT Plan Current plan remains appropriate    Co-evaluation              AM-PAC PT "6 Clicks" Mobility   Outcome Measure  Help needed turning from your back to your side while in a flat bed without using bedrails?: A Little Help needed moving from lying on your back to sitting on the side of a flat bed without using bedrails?: A Little Help needed moving to and from a bed to a chair (including a wheelchair)?: A Little Help needed standing up from a chair using your arms  (e.g., wheelchair or bedside chair)?: A Little Help needed to walk in hospital room?: A Little Help needed climbing 3-5 steps with a railing? : A Lot 6 Click Score: 17    End of Session Equipment Utilized During Treatment: Gait belt Activity Tolerance: Patient tolerated treatment well;Patient limited by pain;Patient limited by fatigue Patient left: in chair;with chair alarm set;with call bell/phone within reach Nurse Communication: Mobility status PT Visit Diagnosis: Muscle weakness (generalized) (M62.81);Difficulty in walking, not elsewhere classified (R26.2)     Time: 3335-4562 PT Time Calculation (min) (ACUTE ONLY): 36 min  Charges:  $Gait Training: 8-22 mins $Therapeutic Exercise: 8-22 mins                     Christine Burns PT Acute Rehabilitation Services Pager [phone] Office [phone]    Christine Burns 04/11/2021, 12:20 PM

## 2021-04-11 NOTE — Plan of Care (Signed)
  Problem: Activity: Goal: Ability to avoid complications of mobility impairment will improve Outcome: Progressing Goal: Range of joint motion will improve Outcome: Progressing   Problem: Pain Management: Goal: Pain level will decrease with appropriate interventions Outcome: Progressing   

## 2021-04-14 DIAGNOSIS — Z96652 Presence of left artificial knee joint: Secondary | ICD-10-CM | POA: Diagnosis not present

## 2021-04-14 DIAGNOSIS — Z471 Aftercare following joint replacement surgery: Secondary | ICD-10-CM | POA: Diagnosis not present

## 2021-04-14 DIAGNOSIS — R2689 Other abnormalities of gait and mobility: Secondary | ICD-10-CM | POA: Diagnosis not present

## 2021-04-14 DIAGNOSIS — M25662 Stiffness of left knee, not elsewhere classified: Secondary | ICD-10-CM | POA: Diagnosis not present

## 2021-04-14 DIAGNOSIS — M6281 Muscle weakness (generalized): Secondary | ICD-10-CM | POA: Diagnosis not present

## 2021-04-16 DIAGNOSIS — Z96652 Presence of left artificial knee joint: Secondary | ICD-10-CM | POA: Diagnosis not present

## 2021-04-16 DIAGNOSIS — Z471 Aftercare following joint replacement surgery: Secondary | ICD-10-CM | POA: Diagnosis not present

## 2021-04-16 DIAGNOSIS — R2689 Other abnormalities of gait and mobility: Secondary | ICD-10-CM | POA: Diagnosis not present

## 2021-04-16 DIAGNOSIS — M6281 Muscle weakness (generalized): Secondary | ICD-10-CM | POA: Diagnosis not present

## 2021-04-16 DIAGNOSIS — M25662 Stiffness of left knee, not elsewhere classified: Secondary | ICD-10-CM | POA: Diagnosis not present

## 2021-04-19 DIAGNOSIS — Z471 Aftercare following joint replacement surgery: Secondary | ICD-10-CM | POA: Diagnosis not present

## 2021-04-19 DIAGNOSIS — R2689 Other abnormalities of gait and mobility: Secondary | ICD-10-CM | POA: Diagnosis not present

## 2021-04-19 DIAGNOSIS — M25662 Stiffness of left knee, not elsewhere classified: Secondary | ICD-10-CM | POA: Diagnosis not present

## 2021-04-19 DIAGNOSIS — Z96652 Presence of left artificial knee joint: Secondary | ICD-10-CM | POA: Diagnosis not present

## 2021-04-19 DIAGNOSIS — M6281 Muscle weakness (generalized): Secondary | ICD-10-CM | POA: Diagnosis not present

## 2021-04-22 DIAGNOSIS — Z471 Aftercare following joint replacement surgery: Secondary | ICD-10-CM | POA: Diagnosis not present

## 2021-04-22 DIAGNOSIS — R2689 Other abnormalities of gait and mobility: Secondary | ICD-10-CM | POA: Diagnosis not present

## 2021-04-22 DIAGNOSIS — Z96652 Presence of left artificial knee joint: Secondary | ICD-10-CM | POA: Diagnosis not present

## 2021-04-22 DIAGNOSIS — M6281 Muscle weakness (generalized): Secondary | ICD-10-CM | POA: Diagnosis not present

## 2021-04-22 DIAGNOSIS — M25662 Stiffness of left knee, not elsewhere classified: Secondary | ICD-10-CM | POA: Diagnosis not present

## 2021-04-23 DIAGNOSIS — M25662 Stiffness of left knee, not elsewhere classified: Secondary | ICD-10-CM | POA: Diagnosis not present

## 2021-04-23 DIAGNOSIS — Z471 Aftercare following joint replacement surgery: Secondary | ICD-10-CM | POA: Diagnosis not present

## 2021-04-23 DIAGNOSIS — M6281 Muscle weakness (generalized): Secondary | ICD-10-CM | POA: Diagnosis not present

## 2021-04-23 DIAGNOSIS — R2689 Other abnormalities of gait and mobility: Secondary | ICD-10-CM | POA: Diagnosis not present

## 2021-04-23 DIAGNOSIS — Z96652 Presence of left artificial knee joint: Secondary | ICD-10-CM | POA: Diagnosis not present

## 2021-04-26 DIAGNOSIS — M25662 Stiffness of left knee, not elsewhere classified: Secondary | ICD-10-CM | POA: Diagnosis not present

## 2021-04-26 DIAGNOSIS — Z471 Aftercare following joint replacement surgery: Secondary | ICD-10-CM | POA: Diagnosis not present

## 2021-04-26 DIAGNOSIS — M6281 Muscle weakness (generalized): Secondary | ICD-10-CM | POA: Diagnosis not present

## 2021-04-26 DIAGNOSIS — R2689 Other abnormalities of gait and mobility: Secondary | ICD-10-CM | POA: Diagnosis not present

## 2021-04-26 DIAGNOSIS — Z96652 Presence of left artificial knee joint: Secondary | ICD-10-CM | POA: Diagnosis not present

## 2021-04-28 DIAGNOSIS — M6281 Muscle weakness (generalized): Secondary | ICD-10-CM | POA: Diagnosis not present

## 2021-04-28 DIAGNOSIS — Z471 Aftercare following joint replacement surgery: Secondary | ICD-10-CM | POA: Diagnosis not present

## 2021-04-28 DIAGNOSIS — R2689 Other abnormalities of gait and mobility: Secondary | ICD-10-CM | POA: Diagnosis not present

## 2021-04-28 DIAGNOSIS — M25662 Stiffness of left knee, not elsewhere classified: Secondary | ICD-10-CM | POA: Diagnosis not present

## 2021-04-28 DIAGNOSIS — Z96652 Presence of left artificial knee joint: Secondary | ICD-10-CM | POA: Diagnosis not present

## 2021-04-30 DIAGNOSIS — M6281 Muscle weakness (generalized): Secondary | ICD-10-CM | POA: Diagnosis not present

## 2021-04-30 DIAGNOSIS — Z471 Aftercare following joint replacement surgery: Secondary | ICD-10-CM | POA: Diagnosis not present

## 2021-04-30 DIAGNOSIS — M25662 Stiffness of left knee, not elsewhere classified: Secondary | ICD-10-CM | POA: Diagnosis not present

## 2021-04-30 DIAGNOSIS — Z96652 Presence of left artificial knee joint: Secondary | ICD-10-CM | POA: Diagnosis not present

## 2021-04-30 DIAGNOSIS — R2689 Other abnormalities of gait and mobility: Secondary | ICD-10-CM | POA: Diagnosis not present

## 2021-05-03 DIAGNOSIS — M6281 Muscle weakness (generalized): Secondary | ICD-10-CM | POA: Diagnosis not present

## 2021-05-03 DIAGNOSIS — F329 Major depressive disorder, single episode, unspecified: Secondary | ICD-10-CM | POA: Diagnosis not present

## 2021-05-03 DIAGNOSIS — G8918 Other acute postprocedural pain: Secondary | ICD-10-CM | POA: Diagnosis not present

## 2021-05-03 DIAGNOSIS — Z96652 Presence of left artificial knee joint: Secondary | ICD-10-CM | POA: Diagnosis not present

## 2021-05-03 DIAGNOSIS — R2689 Other abnormalities of gait and mobility: Secondary | ICD-10-CM | POA: Diagnosis not present

## 2021-05-03 DIAGNOSIS — F418 Other specified anxiety disorders: Secondary | ICD-10-CM | POA: Diagnosis not present

## 2021-05-03 DIAGNOSIS — Z79899 Other long term (current) drug therapy: Secondary | ICD-10-CM | POA: Diagnosis not present

## 2021-05-03 DIAGNOSIS — Z471 Aftercare following joint replacement surgery: Secondary | ICD-10-CM | POA: Diagnosis not present

## 2021-05-03 DIAGNOSIS — M1991 Primary osteoarthritis, unspecified site: Secondary | ICD-10-CM | POA: Diagnosis not present

## 2021-05-03 DIAGNOSIS — M25662 Stiffness of left knee, not elsewhere classified: Secondary | ICD-10-CM | POA: Diagnosis not present

## 2021-05-03 DIAGNOSIS — M13862 Other specified arthritis, left knee: Secondary | ICD-10-CM | POA: Diagnosis not present

## 2021-05-05 DIAGNOSIS — M25662 Stiffness of left knee, not elsewhere classified: Secondary | ICD-10-CM | POA: Diagnosis not present

## 2021-05-05 DIAGNOSIS — Z471 Aftercare following joint replacement surgery: Secondary | ICD-10-CM | POA: Diagnosis not present

## 2021-05-05 DIAGNOSIS — Z96652 Presence of left artificial knee joint: Secondary | ICD-10-CM | POA: Diagnosis not present

## 2021-05-05 DIAGNOSIS — M6281 Muscle weakness (generalized): Secondary | ICD-10-CM | POA: Diagnosis not present

## 2021-05-05 DIAGNOSIS — R2689 Other abnormalities of gait and mobility: Secondary | ICD-10-CM | POA: Diagnosis not present

## 2021-05-07 DIAGNOSIS — M25662 Stiffness of left knee, not elsewhere classified: Secondary | ICD-10-CM | POA: Diagnosis not present

## 2021-05-07 DIAGNOSIS — Z471 Aftercare following joint replacement surgery: Secondary | ICD-10-CM | POA: Diagnosis not present

## 2021-05-07 DIAGNOSIS — M6281 Muscle weakness (generalized): Secondary | ICD-10-CM | POA: Diagnosis not present

## 2021-05-07 DIAGNOSIS — Z96652 Presence of left artificial knee joint: Secondary | ICD-10-CM | POA: Diagnosis not present

## 2021-05-07 DIAGNOSIS — R2689 Other abnormalities of gait and mobility: Secondary | ICD-10-CM | POA: Diagnosis not present

## 2021-05-10 DIAGNOSIS — E8940 Asymptomatic postprocedural ovarian failure: Secondary | ICD-10-CM | POA: Diagnosis not present

## 2021-05-10 DIAGNOSIS — Z6826 Body mass index (BMI) 26.0-26.9, adult: Secondary | ICD-10-CM | POA: Diagnosis not present

## 2021-05-10 DIAGNOSIS — Z299 Encounter for prophylactic measures, unspecified: Secondary | ICD-10-CM | POA: Diagnosis not present

## 2021-05-10 DIAGNOSIS — G4700 Insomnia, unspecified: Secondary | ICD-10-CM | POA: Diagnosis not present

## 2021-05-10 DIAGNOSIS — Z23 Encounter for immunization: Secondary | ICD-10-CM | POA: Diagnosis not present

## 2021-05-10 DIAGNOSIS — R5383 Other fatigue: Secondary | ICD-10-CM | POA: Diagnosis not present

## 2021-05-10 DIAGNOSIS — I10 Essential (primary) hypertension: Secondary | ICD-10-CM | POA: Diagnosis not present

## 2021-05-10 DIAGNOSIS — R432 Parageusia: Secondary | ICD-10-CM | POA: Diagnosis not present

## 2021-05-18 DIAGNOSIS — M6281 Muscle weakness (generalized): Secondary | ICD-10-CM | POA: Diagnosis not present

## 2021-05-18 DIAGNOSIS — Z96652 Presence of left artificial knee joint: Secondary | ICD-10-CM | POA: Diagnosis not present

## 2021-05-18 DIAGNOSIS — R2689 Other abnormalities of gait and mobility: Secondary | ICD-10-CM | POA: Diagnosis not present

## 2021-05-18 DIAGNOSIS — Z471 Aftercare following joint replacement surgery: Secondary | ICD-10-CM | POA: Diagnosis not present

## 2021-05-18 DIAGNOSIS — M25662 Stiffness of left knee, not elsewhere classified: Secondary | ICD-10-CM | POA: Diagnosis not present

## 2021-05-21 DIAGNOSIS — R52 Pain, unspecified: Secondary | ICD-10-CM | POA: Diagnosis not present

## 2021-05-21 DIAGNOSIS — R519 Headache, unspecified: Secondary | ICD-10-CM | POA: Diagnosis not present

## 2021-05-21 DIAGNOSIS — Z299 Encounter for prophylactic measures, unspecified: Secondary | ICD-10-CM | POA: Diagnosis not present

## 2021-05-21 DIAGNOSIS — R251 Tremor, unspecified: Secondary | ICD-10-CM | POA: Diagnosis not present

## 2021-05-21 DIAGNOSIS — I10 Essential (primary) hypertension: Secondary | ICD-10-CM | POA: Diagnosis not present

## 2021-05-24 DIAGNOSIS — Z471 Aftercare following joint replacement surgery: Secondary | ICD-10-CM | POA: Diagnosis not present

## 2021-05-24 DIAGNOSIS — E559 Vitamin D deficiency, unspecified: Secondary | ICD-10-CM | POA: Diagnosis not present

## 2021-05-24 DIAGNOSIS — Z96652 Presence of left artificial knee joint: Secondary | ICD-10-CM | POA: Diagnosis not present

## 2021-05-24 DIAGNOSIS — N1832 Chronic kidney disease, stage 3b: Secondary | ICD-10-CM | POA: Diagnosis not present

## 2021-05-24 DIAGNOSIS — R2689 Other abnormalities of gait and mobility: Secondary | ICD-10-CM | POA: Diagnosis not present

## 2021-05-24 DIAGNOSIS — R809 Proteinuria, unspecified: Secondary | ICD-10-CM | POA: Diagnosis not present

## 2021-05-24 DIAGNOSIS — D638 Anemia in other chronic diseases classified elsewhere: Secondary | ICD-10-CM | POA: Diagnosis not present

## 2021-05-24 DIAGNOSIS — M6281 Muscle weakness (generalized): Secondary | ICD-10-CM | POA: Diagnosis not present

## 2021-05-24 DIAGNOSIS — M25662 Stiffness of left knee, not elsewhere classified: Secondary | ICD-10-CM | POA: Diagnosis not present

## 2021-05-24 DIAGNOSIS — I129 Hypertensive chronic kidney disease with stage 1 through stage 4 chronic kidney disease, or unspecified chronic kidney disease: Secondary | ICD-10-CM | POA: Diagnosis not present

## 2021-05-25 DIAGNOSIS — Z471 Aftercare following joint replacement surgery: Secondary | ICD-10-CM | POA: Diagnosis not present

## 2021-05-25 DIAGNOSIS — Z96652 Presence of left artificial knee joint: Secondary | ICD-10-CM | POA: Diagnosis not present

## 2021-05-28 DIAGNOSIS — N1832 Chronic kidney disease, stage 3b: Secondary | ICD-10-CM | POA: Diagnosis not present

## 2021-05-28 DIAGNOSIS — D638 Anemia in other chronic diseases classified elsewhere: Secondary | ICD-10-CM | POA: Diagnosis not present

## 2021-05-28 DIAGNOSIS — R809 Proteinuria, unspecified: Secondary | ICD-10-CM | POA: Diagnosis not present

## 2021-05-28 DIAGNOSIS — E559 Vitamin D deficiency, unspecified: Secondary | ICD-10-CM | POA: Diagnosis not present

## 2021-05-28 DIAGNOSIS — M25662 Stiffness of left knee, not elsewhere classified: Secondary | ICD-10-CM | POA: Diagnosis not present

## 2021-05-28 DIAGNOSIS — Z471 Aftercare following joint replacement surgery: Secondary | ICD-10-CM | POA: Diagnosis not present

## 2021-05-28 DIAGNOSIS — Z96652 Presence of left artificial knee joint: Secondary | ICD-10-CM | POA: Diagnosis not present

## 2021-05-28 DIAGNOSIS — R2689 Other abnormalities of gait and mobility: Secondary | ICD-10-CM | POA: Diagnosis not present

## 2021-05-28 DIAGNOSIS — M6281 Muscle weakness (generalized): Secondary | ICD-10-CM | POA: Diagnosis not present

## 2021-05-28 DIAGNOSIS — I129 Hypertensive chronic kidney disease with stage 1 through stage 4 chronic kidney disease, or unspecified chronic kidney disease: Secondary | ICD-10-CM | POA: Diagnosis not present

## 2021-05-31 DIAGNOSIS — M6281 Muscle weakness (generalized): Secondary | ICD-10-CM | POA: Diagnosis not present

## 2021-05-31 DIAGNOSIS — Z96652 Presence of left artificial knee joint: Secondary | ICD-10-CM | POA: Diagnosis not present

## 2021-05-31 DIAGNOSIS — M25662 Stiffness of left knee, not elsewhere classified: Secondary | ICD-10-CM | POA: Diagnosis not present

## 2021-05-31 DIAGNOSIS — Z471 Aftercare following joint replacement surgery: Secondary | ICD-10-CM | POA: Diagnosis not present

## 2021-05-31 DIAGNOSIS — R2689 Other abnormalities of gait and mobility: Secondary | ICD-10-CM | POA: Diagnosis not present

## 2021-06-01 DIAGNOSIS — Z299 Encounter for prophylactic measures, unspecified: Secondary | ICD-10-CM | POA: Diagnosis not present

## 2021-06-01 DIAGNOSIS — E039 Hypothyroidism, unspecified: Secondary | ICD-10-CM | POA: Diagnosis not present

## 2021-06-01 DIAGNOSIS — I10 Essential (primary) hypertension: Secondary | ICD-10-CM | POA: Diagnosis not present

## 2021-06-01 DIAGNOSIS — R110 Nausea: Secondary | ICD-10-CM | POA: Diagnosis not present

## 2021-06-01 DIAGNOSIS — J302 Other seasonal allergic rhinitis: Secondary | ICD-10-CM | POA: Diagnosis not present

## 2021-06-04 DIAGNOSIS — R2689 Other abnormalities of gait and mobility: Secondary | ICD-10-CM | POA: Diagnosis not present

## 2021-06-04 DIAGNOSIS — Z96652 Presence of left artificial knee joint: Secondary | ICD-10-CM | POA: Diagnosis not present

## 2021-06-04 DIAGNOSIS — Z471 Aftercare following joint replacement surgery: Secondary | ICD-10-CM | POA: Diagnosis not present

## 2021-06-04 DIAGNOSIS — M6281 Muscle weakness (generalized): Secondary | ICD-10-CM | POA: Diagnosis not present

## 2021-06-04 DIAGNOSIS — M25662 Stiffness of left knee, not elsewhere classified: Secondary | ICD-10-CM | POA: Diagnosis not present

## 2021-06-07 DIAGNOSIS — Z96652 Presence of left artificial knee joint: Secondary | ICD-10-CM | POA: Diagnosis not present

## 2021-06-07 DIAGNOSIS — R2689 Other abnormalities of gait and mobility: Secondary | ICD-10-CM | POA: Diagnosis not present

## 2021-06-07 DIAGNOSIS — M25662 Stiffness of left knee, not elsewhere classified: Secondary | ICD-10-CM | POA: Diagnosis not present

## 2021-06-07 DIAGNOSIS — M6281 Muscle weakness (generalized): Secondary | ICD-10-CM | POA: Diagnosis not present

## 2021-06-07 DIAGNOSIS — Z471 Aftercare following joint replacement surgery: Secondary | ICD-10-CM | POA: Diagnosis not present

## 2021-06-11 DIAGNOSIS — Z471 Aftercare following joint replacement surgery: Secondary | ICD-10-CM | POA: Diagnosis not present

## 2021-06-11 DIAGNOSIS — M25662 Stiffness of left knee, not elsewhere classified: Secondary | ICD-10-CM | POA: Diagnosis not present

## 2021-06-11 DIAGNOSIS — M6281 Muscle weakness (generalized): Secondary | ICD-10-CM | POA: Diagnosis not present

## 2021-06-11 DIAGNOSIS — R2689 Other abnormalities of gait and mobility: Secondary | ICD-10-CM | POA: Diagnosis not present

## 2021-06-11 DIAGNOSIS — Z96652 Presence of left artificial knee joint: Secondary | ICD-10-CM | POA: Diagnosis not present

## 2021-06-23 DIAGNOSIS — Z789 Other specified health status: Secondary | ICD-10-CM | POA: Diagnosis not present

## 2021-06-23 DIAGNOSIS — Z299 Encounter for prophylactic measures, unspecified: Secondary | ICD-10-CM | POA: Diagnosis not present

## 2021-06-23 DIAGNOSIS — I10 Essential (primary) hypertension: Secondary | ICD-10-CM | POA: Diagnosis not present

## 2021-06-23 DIAGNOSIS — J40 Bronchitis, not specified as acute or chronic: Secondary | ICD-10-CM | POA: Diagnosis not present

## 2021-06-23 DIAGNOSIS — Z6826 Body mass index (BMI) 26.0-26.9, adult: Secondary | ICD-10-CM | POA: Diagnosis not present

## 2021-07-08 DIAGNOSIS — Z6826 Body mass index (BMI) 26.0-26.9, adult: Secondary | ICD-10-CM | POA: Diagnosis not present

## 2021-07-08 DIAGNOSIS — Z1339 Encounter for screening examination for other mental health and behavioral disorders: Secondary | ICD-10-CM | POA: Diagnosis not present

## 2021-07-08 DIAGNOSIS — G250 Essential tremor: Secondary | ICD-10-CM | POA: Diagnosis not present

## 2021-07-08 DIAGNOSIS — Z1331 Encounter for screening for depression: Secondary | ICD-10-CM | POA: Diagnosis not present

## 2021-07-08 DIAGNOSIS — Z0000 Encounter for general adult medical examination without abnormal findings: Secondary | ICD-10-CM | POA: Diagnosis not present

## 2021-07-08 DIAGNOSIS — R5383 Other fatigue: Secondary | ICD-10-CM | POA: Diagnosis not present

## 2021-07-08 DIAGNOSIS — E7800 Pure hypercholesterolemia, unspecified: Secondary | ICD-10-CM | POA: Diagnosis not present

## 2021-07-08 DIAGNOSIS — Z7189 Other specified counseling: Secondary | ICD-10-CM | POA: Diagnosis not present

## 2021-07-08 DIAGNOSIS — Z299 Encounter for prophylactic measures, unspecified: Secondary | ICD-10-CM | POA: Diagnosis not present

## 2021-07-23 DIAGNOSIS — I10 Essential (primary) hypertension: Secondary | ICD-10-CM | POA: Diagnosis not present

## 2021-07-23 DIAGNOSIS — M4185 Other forms of scoliosis, thoracolumbar region: Secondary | ICD-10-CM | POA: Diagnosis not present

## 2021-07-23 DIAGNOSIS — E039 Hypothyroidism, unspecified: Secondary | ICD-10-CM | POA: Diagnosis not present

## 2021-07-23 DIAGNOSIS — M546 Pain in thoracic spine: Secondary | ICD-10-CM | POA: Diagnosis not present

## 2021-07-23 DIAGNOSIS — Z299 Encounter for prophylactic measures, unspecified: Secondary | ICD-10-CM | POA: Diagnosis not present

## 2021-07-23 DIAGNOSIS — N183 Chronic kidney disease, stage 3 unspecified: Secondary | ICD-10-CM | POA: Diagnosis not present

## 2021-07-23 DIAGNOSIS — R5383 Other fatigue: Secondary | ICD-10-CM | POA: Diagnosis not present

## 2021-07-23 DIAGNOSIS — E559 Vitamin D deficiency, unspecified: Secondary | ICD-10-CM | POA: Diagnosis not present

## 2021-07-23 DIAGNOSIS — Z79899 Other long term (current) drug therapy: Secondary | ICD-10-CM | POA: Diagnosis not present

## 2021-07-23 DIAGNOSIS — Z981 Arthrodesis status: Secondary | ICD-10-CM | POA: Diagnosis not present

## 2021-07-23 DIAGNOSIS — E7800 Pure hypercholesterolemia, unspecified: Secondary | ICD-10-CM | POA: Diagnosis not present

## 2021-07-28 DIAGNOSIS — M7918 Myalgia, other site: Secondary | ICD-10-CM | POA: Diagnosis not present

## 2021-07-28 DIAGNOSIS — M461 Sacroiliitis, not elsewhere classified: Secondary | ICD-10-CM | POA: Diagnosis not present

## 2021-07-28 DIAGNOSIS — Z6825 Body mass index (BMI) 25.0-25.9, adult: Secondary | ICD-10-CM | POA: Diagnosis not present

## 2021-07-28 DIAGNOSIS — M4325 Fusion of spine, thoracolumbar region: Secondary | ICD-10-CM | POA: Diagnosis not present

## 2021-07-30 DIAGNOSIS — M419 Scoliosis, unspecified: Secondary | ICD-10-CM | POA: Diagnosis not present

## 2021-07-30 DIAGNOSIS — M4325 Fusion of spine, thoracolumbar region: Secondary | ICD-10-CM | POA: Diagnosis not present

## 2021-07-30 DIAGNOSIS — M4804 Spinal stenosis, thoracic region: Secondary | ICD-10-CM | POA: Diagnosis not present

## 2021-07-30 DIAGNOSIS — Z981 Arthrodesis status: Secondary | ICD-10-CM | POA: Diagnosis not present

## 2021-07-30 DIAGNOSIS — M546 Pain in thoracic spine: Secondary | ICD-10-CM | POA: Diagnosis not present

## 2021-08-02 DIAGNOSIS — F418 Other specified anxiety disorders: Secondary | ICD-10-CM | POA: Diagnosis not present

## 2021-08-02 DIAGNOSIS — M1991 Primary osteoarthritis, unspecified site: Secondary | ICD-10-CM | POA: Diagnosis not present

## 2021-08-02 DIAGNOSIS — F329 Major depressive disorder, single episode, unspecified: Secondary | ICD-10-CM | POA: Diagnosis not present

## 2021-08-02 DIAGNOSIS — Z79899 Other long term (current) drug therapy: Secondary | ICD-10-CM | POA: Diagnosis not present

## 2021-08-02 DIAGNOSIS — M13862 Other specified arthritis, left knee: Secondary | ICD-10-CM | POA: Diagnosis not present

## 2021-08-10 DIAGNOSIS — Z299 Encounter for prophylactic measures, unspecified: Secondary | ICD-10-CM | POA: Diagnosis not present

## 2021-08-10 DIAGNOSIS — Z789 Other specified health status: Secondary | ICD-10-CM | POA: Diagnosis not present

## 2021-08-10 DIAGNOSIS — Z6828 Body mass index (BMI) 28.0-28.9, adult: Secondary | ICD-10-CM | POA: Diagnosis not present

## 2021-08-10 DIAGNOSIS — M549 Dorsalgia, unspecified: Secondary | ICD-10-CM | POA: Diagnosis not present

## 2021-08-10 DIAGNOSIS — I10 Essential (primary) hypertension: Secondary | ICD-10-CM | POA: Diagnosis not present

## 2021-08-10 DIAGNOSIS — G250 Essential tremor: Secondary | ICD-10-CM | POA: Diagnosis not present

## 2021-08-10 DIAGNOSIS — G4700 Insomnia, unspecified: Secondary | ICD-10-CM | POA: Diagnosis not present

## 2021-08-11 DIAGNOSIS — M4722 Other spondylosis with radiculopathy, cervical region: Secondary | ICD-10-CM | POA: Diagnosis not present

## 2021-08-11 DIAGNOSIS — M4325 Fusion of spine, thoracolumbar region: Secondary | ICD-10-CM | POA: Diagnosis not present

## 2021-08-11 DIAGNOSIS — M542 Cervicalgia: Secondary | ICD-10-CM | POA: Diagnosis not present

## 2021-08-11 DIAGNOSIS — Z6825 Body mass index (BMI) 25.0-25.9, adult: Secondary | ICD-10-CM | POA: Diagnosis not present

## 2021-08-18 DIAGNOSIS — M5412 Radiculopathy, cervical region: Secondary | ICD-10-CM | POA: Diagnosis not present

## 2021-08-23 DIAGNOSIS — E559 Vitamin D deficiency, unspecified: Secondary | ICD-10-CM | POA: Diagnosis not present

## 2021-08-23 DIAGNOSIS — N1832 Chronic kidney disease, stage 3b: Secondary | ICD-10-CM | POA: Diagnosis not present

## 2021-08-23 DIAGNOSIS — I129 Hypertensive chronic kidney disease with stage 1 through stage 4 chronic kidney disease, or unspecified chronic kidney disease: Secondary | ICD-10-CM | POA: Diagnosis not present

## 2021-08-23 DIAGNOSIS — D638 Anemia in other chronic diseases classified elsewhere: Secondary | ICD-10-CM | POA: Diagnosis not present

## 2021-08-23 DIAGNOSIS — R809 Proteinuria, unspecified: Secondary | ICD-10-CM | POA: Diagnosis not present

## 2021-09-01 DIAGNOSIS — R809 Proteinuria, unspecified: Secondary | ICD-10-CM | POA: Diagnosis not present

## 2021-09-01 DIAGNOSIS — E559 Vitamin D deficiency, unspecified: Secondary | ICD-10-CM | POA: Diagnosis not present

## 2021-09-01 DIAGNOSIS — R531 Weakness: Secondary | ICD-10-CM | POA: Diagnosis not present

## 2021-09-01 DIAGNOSIS — I129 Hypertensive chronic kidney disease with stage 1 through stage 4 chronic kidney disease, or unspecified chronic kidney disease: Secondary | ICD-10-CM | POA: Diagnosis not present

## 2021-09-01 DIAGNOSIS — N1832 Chronic kidney disease, stage 3b: Secondary | ICD-10-CM | POA: Diagnosis not present

## 2021-09-15 DIAGNOSIS — Z713 Dietary counseling and surveillance: Secondary | ICD-10-CM | POA: Diagnosis not present

## 2021-09-15 DIAGNOSIS — Z6826 Body mass index (BMI) 26.0-26.9, adult: Secondary | ICD-10-CM | POA: Diagnosis not present

## 2021-09-15 DIAGNOSIS — Z299 Encounter for prophylactic measures, unspecified: Secondary | ICD-10-CM | POA: Diagnosis not present

## 2021-09-15 DIAGNOSIS — I10 Essential (primary) hypertension: Secondary | ICD-10-CM | POA: Diagnosis not present

## 2021-09-20 DIAGNOSIS — M47812 Spondylosis without myelopathy or radiculopathy, cervical region: Secondary | ICD-10-CM | POA: Diagnosis not present

## 2021-09-20 DIAGNOSIS — M5412 Radiculopathy, cervical region: Secondary | ICD-10-CM | POA: Diagnosis not present

## 2021-09-20 DIAGNOSIS — M4325 Fusion of spine, thoracolumbar region: Secondary | ICD-10-CM | POA: Diagnosis not present

## 2021-10-04 DIAGNOSIS — M47812 Spondylosis without myelopathy or radiculopathy, cervical region: Secondary | ICD-10-CM | POA: Diagnosis not present

## 2021-11-03 DIAGNOSIS — M19011 Primary osteoarthritis, right shoulder: Secondary | ICD-10-CM | POA: Diagnosis not present

## 2021-11-03 DIAGNOSIS — M19012 Primary osteoarthritis, left shoulder: Secondary | ICD-10-CM | POA: Diagnosis not present

## 2021-11-03 DIAGNOSIS — F418 Other specified anxiety disorders: Secondary | ICD-10-CM | POA: Diagnosis not present

## 2021-11-03 DIAGNOSIS — Z79891 Long term (current) use of opiate analgesic: Secondary | ICD-10-CM | POA: Diagnosis not present

## 2021-11-03 DIAGNOSIS — M13862 Other specified arthritis, left knee: Secondary | ICD-10-CM | POA: Diagnosis not present

## 2021-11-03 DIAGNOSIS — Z5181 Encounter for therapeutic drug level monitoring: Secondary | ICD-10-CM | POA: Diagnosis not present

## 2021-11-03 DIAGNOSIS — Z79899 Other long term (current) drug therapy: Secondary | ICD-10-CM | POA: Diagnosis not present

## 2021-11-03 DIAGNOSIS — F329 Major depressive disorder, single episode, unspecified: Secondary | ICD-10-CM | POA: Diagnosis not present

## 2021-11-08 DIAGNOSIS — Z1211 Encounter for screening for malignant neoplasm of colon: Secondary | ICD-10-CM | POA: Diagnosis not present

## 2021-11-08 DIAGNOSIS — Z299 Encounter for prophylactic measures, unspecified: Secondary | ICD-10-CM | POA: Diagnosis not present

## 2021-11-08 DIAGNOSIS — Z0000 Encounter for general adult medical examination without abnormal findings: Secondary | ICD-10-CM | POA: Diagnosis not present

## 2021-11-08 DIAGNOSIS — Z1331 Encounter for screening for depression: Secondary | ICD-10-CM | POA: Diagnosis not present

## 2021-11-08 DIAGNOSIS — Z6826 Body mass index (BMI) 26.0-26.9, adult: Secondary | ICD-10-CM | POA: Diagnosis not present

## 2021-11-08 DIAGNOSIS — Z7189 Other specified counseling: Secondary | ICD-10-CM | POA: Diagnosis not present

## 2021-11-29 DIAGNOSIS — N1832 Chronic kidney disease, stage 3b: Secondary | ICD-10-CM | POA: Diagnosis not present

## 2021-11-29 DIAGNOSIS — R809 Proteinuria, unspecified: Secondary | ICD-10-CM | POA: Diagnosis not present

## 2021-11-29 DIAGNOSIS — E559 Vitamin D deficiency, unspecified: Secondary | ICD-10-CM | POA: Diagnosis not present

## 2021-11-29 DIAGNOSIS — I129 Hypertensive chronic kidney disease with stage 1 through stage 4 chronic kidney disease, or unspecified chronic kidney disease: Secondary | ICD-10-CM | POA: Diagnosis not present

## 2021-12-02 DIAGNOSIS — Z299 Encounter for prophylactic measures, unspecified: Secondary | ICD-10-CM | POA: Diagnosis not present

## 2021-12-02 DIAGNOSIS — I129 Hypertensive chronic kidney disease with stage 1 through stage 4 chronic kidney disease, or unspecified chronic kidney disease: Secondary | ICD-10-CM | POA: Diagnosis not present

## 2021-12-02 DIAGNOSIS — Z6826 Body mass index (BMI) 26.0-26.9, adult: Secondary | ICD-10-CM | POA: Diagnosis not present

## 2021-12-02 DIAGNOSIS — Z713 Dietary counseling and surveillance: Secondary | ICD-10-CM | POA: Diagnosis not present

## 2021-12-02 DIAGNOSIS — R809 Proteinuria, unspecified: Secondary | ICD-10-CM | POA: Diagnosis not present

## 2021-12-02 DIAGNOSIS — I10 Essential (primary) hypertension: Secondary | ICD-10-CM | POA: Diagnosis not present

## 2021-12-02 DIAGNOSIS — N200 Calculus of kidney: Secondary | ICD-10-CM | POA: Diagnosis not present

## 2021-12-02 DIAGNOSIS — N1832 Chronic kidney disease, stage 3b: Secondary | ICD-10-CM | POA: Diagnosis not present

## 2021-12-02 DIAGNOSIS — N2581 Secondary hyperparathyroidism of renal origin: Secondary | ICD-10-CM | POA: Diagnosis not present

## 2021-12-06 DIAGNOSIS — Z6825 Body mass index (BMI) 25.0-25.9, adult: Secondary | ICD-10-CM | POA: Diagnosis not present

## 2021-12-06 DIAGNOSIS — M47812 Spondylosis without myelopathy or radiculopathy, cervical region: Secondary | ICD-10-CM | POA: Diagnosis not present

## 2022-01-11 DIAGNOSIS — Z299 Encounter for prophylactic measures, unspecified: Secondary | ICD-10-CM | POA: Diagnosis not present

## 2022-01-11 DIAGNOSIS — R110 Nausea: Secondary | ICD-10-CM | POA: Diagnosis not present

## 2022-01-11 DIAGNOSIS — Z6826 Body mass index (BMI) 26.0-26.9, adult: Secondary | ICD-10-CM | POA: Diagnosis not present

## 2022-01-11 DIAGNOSIS — I10 Essential (primary) hypertension: Secondary | ICD-10-CM | POA: Diagnosis not present

## 2022-01-11 DIAGNOSIS — Z713 Dietary counseling and surveillance: Secondary | ICD-10-CM | POA: Diagnosis not present

## 2022-01-20 DIAGNOSIS — H5200 Hypermetropia, unspecified eye: Secondary | ICD-10-CM | POA: Diagnosis not present

## 2022-01-20 DIAGNOSIS — I10 Essential (primary) hypertension: Secondary | ICD-10-CM | POA: Diagnosis not present

## 2022-01-21 DIAGNOSIS — Z299 Encounter for prophylactic measures, unspecified: Secondary | ICD-10-CM | POA: Diagnosis not present

## 2022-01-21 DIAGNOSIS — J019 Acute sinusitis, unspecified: Secondary | ICD-10-CM | POA: Diagnosis not present

## 2022-01-21 DIAGNOSIS — I10 Essential (primary) hypertension: Secondary | ICD-10-CM | POA: Diagnosis not present

## 2022-01-21 DIAGNOSIS — Z6826 Body mass index (BMI) 26.0-26.9, adult: Secondary | ICD-10-CM | POA: Diagnosis not present

## 2022-01-21 DIAGNOSIS — R432 Parageusia: Secondary | ICD-10-CM | POA: Diagnosis not present

## 2022-01-24 DIAGNOSIS — M5412 Radiculopathy, cervical region: Secondary | ICD-10-CM | POA: Diagnosis not present

## 2022-01-26 DIAGNOSIS — Z0100 Encounter for examination of eyes and vision without abnormal findings: Secondary | ICD-10-CM | POA: Diagnosis not present

## 2022-02-08 DIAGNOSIS — I10 Essential (primary) hypertension: Secondary | ICD-10-CM | POA: Diagnosis not present

## 2022-02-08 DIAGNOSIS — Z299 Encounter for prophylactic measures, unspecified: Secondary | ICD-10-CM | POA: Diagnosis not present

## 2022-02-08 DIAGNOSIS — J069 Acute upper respiratory infection, unspecified: Secondary | ICD-10-CM | POA: Diagnosis not present

## 2022-02-08 DIAGNOSIS — J029 Acute pharyngitis, unspecified: Secondary | ICD-10-CM | POA: Diagnosis not present

## 2022-02-09 DIAGNOSIS — I10 Essential (primary) hypertension: Secondary | ICD-10-CM | POA: Diagnosis not present

## 2022-02-09 DIAGNOSIS — J069 Acute upper respiratory infection, unspecified: Secondary | ICD-10-CM | POA: Diagnosis not present

## 2022-02-09 DIAGNOSIS — Z299 Encounter for prophylactic measures, unspecified: Secondary | ICD-10-CM | POA: Diagnosis not present

## 2022-02-10 DIAGNOSIS — Z299 Encounter for prophylactic measures, unspecified: Secondary | ICD-10-CM | POA: Diagnosis not present

## 2022-02-10 DIAGNOSIS — J069 Acute upper respiratory infection, unspecified: Secondary | ICD-10-CM | POA: Diagnosis not present

## 2022-02-10 DIAGNOSIS — R6883 Chills (without fever): Secondary | ICD-10-CM | POA: Diagnosis not present

## 2022-02-10 DIAGNOSIS — R197 Diarrhea, unspecified: Secondary | ICD-10-CM | POA: Diagnosis not present

## 2022-02-10 DIAGNOSIS — R110 Nausea: Secondary | ICD-10-CM | POA: Diagnosis not present

## 2022-02-16 DIAGNOSIS — I10 Essential (primary) hypertension: Secondary | ICD-10-CM | POA: Diagnosis not present

## 2022-02-16 DIAGNOSIS — N183 Chronic kidney disease, stage 3 unspecified: Secondary | ICD-10-CM | POA: Diagnosis not present

## 2022-02-16 DIAGNOSIS — Z299 Encounter for prophylactic measures, unspecified: Secondary | ICD-10-CM | POA: Diagnosis not present

## 2022-02-16 DIAGNOSIS — Z6826 Body mass index (BMI) 26.0-26.9, adult: Secondary | ICD-10-CM | POA: Diagnosis not present

## 2022-02-16 DIAGNOSIS — Z0000 Encounter for general adult medical examination without abnormal findings: Secondary | ICD-10-CM | POA: Diagnosis not present

## 2022-03-04 DIAGNOSIS — N200 Calculus of kidney: Secondary | ICD-10-CM | POA: Diagnosis not present

## 2022-03-04 DIAGNOSIS — R809 Proteinuria, unspecified: Secondary | ICD-10-CM | POA: Diagnosis not present

## 2022-03-04 DIAGNOSIS — N1832 Chronic kidney disease, stage 3b: Secondary | ICD-10-CM | POA: Diagnosis not present

## 2022-03-04 DIAGNOSIS — I129 Hypertensive chronic kidney disease with stage 1 through stage 4 chronic kidney disease, or unspecified chronic kidney disease: Secondary | ICD-10-CM | POA: Diagnosis not present

## 2022-03-09 DIAGNOSIS — N1832 Chronic kidney disease, stage 3b: Secondary | ICD-10-CM | POA: Diagnosis not present

## 2022-03-09 DIAGNOSIS — I129 Hypertensive chronic kidney disease with stage 1 through stage 4 chronic kidney disease, or unspecified chronic kidney disease: Secondary | ICD-10-CM | POA: Diagnosis not present

## 2022-03-09 DIAGNOSIS — R809 Proteinuria, unspecified: Secondary | ICD-10-CM | POA: Diagnosis not present

## 2022-03-09 DIAGNOSIS — N200 Calculus of kidney: Secondary | ICD-10-CM | POA: Diagnosis not present

## 2022-04-04 DIAGNOSIS — F339 Major depressive disorder, recurrent, unspecified: Secondary | ICD-10-CM | POA: Diagnosis not present

## 2022-04-04 DIAGNOSIS — F418 Other specified anxiety disorders: Secondary | ICD-10-CM | POA: Diagnosis not present

## 2022-04-04 DIAGNOSIS — M47896 Other spondylosis, lumbar region: Secondary | ICD-10-CM | POA: Diagnosis not present

## 2022-04-04 DIAGNOSIS — M1991 Primary osteoarthritis, unspecified site: Secondary | ICD-10-CM | POA: Diagnosis not present

## 2022-04-04 DIAGNOSIS — M797 Fibromyalgia: Secondary | ICD-10-CM | POA: Diagnosis not present

## 2022-04-04 DIAGNOSIS — M13862 Other specified arthritis, left knee: Secondary | ICD-10-CM | POA: Diagnosis not present

## 2022-04-04 DIAGNOSIS — Z79899 Other long term (current) drug therapy: Secondary | ICD-10-CM | POA: Diagnosis not present

## 2022-04-13 DIAGNOSIS — N1832 Chronic kidney disease, stage 3b: Secondary | ICD-10-CM | POA: Diagnosis not present

## 2022-04-13 DIAGNOSIS — I129 Hypertensive chronic kidney disease with stage 1 through stage 4 chronic kidney disease, or unspecified chronic kidney disease: Secondary | ICD-10-CM | POA: Diagnosis not present

## 2022-04-13 DIAGNOSIS — N200 Calculus of kidney: Secondary | ICD-10-CM | POA: Diagnosis not present

## 2022-04-13 DIAGNOSIS — R809 Proteinuria, unspecified: Secondary | ICD-10-CM | POA: Diagnosis not present

## 2022-04-23 DIAGNOSIS — R001 Bradycardia, unspecified: Secondary | ICD-10-CM | POA: Diagnosis not present

## 2022-04-23 DIAGNOSIS — I10 Essential (primary) hypertension: Secondary | ICD-10-CM | POA: Diagnosis not present

## 2022-05-19 DIAGNOSIS — I10 Essential (primary) hypertension: Secondary | ICD-10-CM | POA: Diagnosis not present

## 2022-05-19 DIAGNOSIS — J40 Bronchitis, not specified as acute or chronic: Secondary | ICD-10-CM | POA: Diagnosis not present

## 2022-05-19 DIAGNOSIS — M549 Dorsalgia, unspecified: Secondary | ICD-10-CM | POA: Diagnosis not present

## 2022-05-19 DIAGNOSIS — N183 Chronic kidney disease, stage 3 unspecified: Secondary | ICD-10-CM | POA: Diagnosis not present

## 2022-05-19 DIAGNOSIS — Z6827 Body mass index (BMI) 27.0-27.9, adult: Secondary | ICD-10-CM | POA: Diagnosis not present

## 2022-05-19 DIAGNOSIS — Z299 Encounter for prophylactic measures, unspecified: Secondary | ICD-10-CM | POA: Diagnosis not present

## 2022-05-30 DIAGNOSIS — M797 Fibromyalgia: Secondary | ICD-10-CM | POA: Diagnosis not present

## 2022-05-30 DIAGNOSIS — F329 Major depressive disorder, single episode, unspecified: Secondary | ICD-10-CM | POA: Diagnosis not present

## 2022-05-30 DIAGNOSIS — F418 Other specified anxiety disorders: Secondary | ICD-10-CM | POA: Diagnosis not present

## 2022-05-30 DIAGNOSIS — M1991 Primary osteoarthritis, unspecified site: Secondary | ICD-10-CM | POA: Diagnosis not present

## 2022-05-30 DIAGNOSIS — M13862 Other specified arthritis, left knee: Secondary | ICD-10-CM | POA: Diagnosis not present

## 2022-05-30 DIAGNOSIS — Z79899 Other long term (current) drug therapy: Secondary | ICD-10-CM | POA: Diagnosis not present

## 2022-05-30 DIAGNOSIS — F339 Major depressive disorder, recurrent, unspecified: Secondary | ICD-10-CM | POA: Diagnosis not present

## 2022-06-02 DIAGNOSIS — Z6826 Body mass index (BMI) 26.0-26.9, adult: Secondary | ICD-10-CM | POA: Diagnosis not present

## 2022-06-02 DIAGNOSIS — Z299 Encounter for prophylactic measures, unspecified: Secondary | ICD-10-CM | POA: Diagnosis not present

## 2022-06-02 DIAGNOSIS — M549 Dorsalgia, unspecified: Secondary | ICD-10-CM | POA: Diagnosis not present

## 2022-06-02 DIAGNOSIS — G250 Essential tremor: Secondary | ICD-10-CM | POA: Diagnosis not present

## 2022-06-02 DIAGNOSIS — I10 Essential (primary) hypertension: Secondary | ICD-10-CM | POA: Diagnosis not present

## 2022-06-07 DIAGNOSIS — Z299 Encounter for prophylactic measures, unspecified: Secondary | ICD-10-CM | POA: Diagnosis not present

## 2022-06-07 DIAGNOSIS — Z6826 Body mass index (BMI) 26.0-26.9, adult: Secondary | ICD-10-CM | POA: Diagnosis not present

## 2022-06-07 DIAGNOSIS — R509 Fever, unspecified: Secondary | ICD-10-CM | POA: Diagnosis not present

## 2022-06-07 DIAGNOSIS — J069 Acute upper respiratory infection, unspecified: Secondary | ICD-10-CM | POA: Diagnosis not present

## 2022-06-07 DIAGNOSIS — I10 Essential (primary) hypertension: Secondary | ICD-10-CM | POA: Diagnosis not present

## 2022-06-09 DIAGNOSIS — M5412 Radiculopathy, cervical region: Secondary | ICD-10-CM | POA: Diagnosis not present

## 2022-07-08 DIAGNOSIS — Z6825 Body mass index (BMI) 25.0-25.9, adult: Secondary | ICD-10-CM | POA: Diagnosis not present

## 2022-07-08 DIAGNOSIS — Z299 Encounter for prophylactic measures, unspecified: Secondary | ICD-10-CM | POA: Diagnosis not present

## 2022-07-08 DIAGNOSIS — E039 Hypothyroidism, unspecified: Secondary | ICD-10-CM | POA: Diagnosis not present

## 2022-07-08 DIAGNOSIS — Z713 Dietary counseling and surveillance: Secondary | ICD-10-CM | POA: Diagnosis not present

## 2022-07-08 DIAGNOSIS — I10 Essential (primary) hypertension: Secondary | ICD-10-CM | POA: Diagnosis not present

## 2022-07-20 DIAGNOSIS — M5412 Radiculopathy, cervical region: Secondary | ICD-10-CM | POA: Diagnosis not present

## 2022-08-01 DIAGNOSIS — R809 Proteinuria, unspecified: Secondary | ICD-10-CM | POA: Diagnosis not present

## 2022-08-01 DIAGNOSIS — I129 Hypertensive chronic kidney disease with stage 1 through stage 4 chronic kidney disease, or unspecified chronic kidney disease: Secondary | ICD-10-CM | POA: Diagnosis not present

## 2022-08-01 DIAGNOSIS — N200 Calculus of kidney: Secondary | ICD-10-CM | POA: Diagnosis not present

## 2022-08-01 DIAGNOSIS — N1832 Chronic kidney disease, stage 3b: Secondary | ICD-10-CM | POA: Diagnosis not present

## 2022-08-05 DIAGNOSIS — I129 Hypertensive chronic kidney disease with stage 1 through stage 4 chronic kidney disease, or unspecified chronic kidney disease: Secondary | ICD-10-CM | POA: Diagnosis not present

## 2022-08-05 DIAGNOSIS — D638 Anemia in other chronic diseases classified elsewhere: Secondary | ICD-10-CM | POA: Diagnosis not present

## 2022-08-05 DIAGNOSIS — R809 Proteinuria, unspecified: Secondary | ICD-10-CM | POA: Diagnosis not present

## 2022-08-05 DIAGNOSIS — F4320 Adjustment disorder, unspecified: Secondary | ICD-10-CM | POA: Diagnosis not present

## 2022-08-05 DIAGNOSIS — N1832 Chronic kidney disease, stage 3b: Secondary | ICD-10-CM | POA: Diagnosis not present

## 2022-08-05 DIAGNOSIS — N200 Calculus of kidney: Secondary | ICD-10-CM | POA: Diagnosis not present

## 2022-08-05 DIAGNOSIS — E876 Hypokalemia: Secondary | ICD-10-CM | POA: Diagnosis not present

## 2022-08-31 DIAGNOSIS — M13862 Other specified arthritis, left knee: Secondary | ICD-10-CM | POA: Diagnosis not present

## 2022-08-31 DIAGNOSIS — Z5181 Encounter for therapeutic drug level monitoring: Secondary | ICD-10-CM | POA: Diagnosis not present

## 2022-08-31 DIAGNOSIS — Z79899 Other long term (current) drug therapy: Secondary | ICD-10-CM | POA: Diagnosis not present

## 2022-08-31 DIAGNOSIS — F418 Other specified anxiety disorders: Secondary | ICD-10-CM | POA: Diagnosis not present

## 2022-08-31 DIAGNOSIS — M797 Fibromyalgia: Secondary | ICD-10-CM | POA: Diagnosis not present

## 2022-08-31 DIAGNOSIS — M1991 Primary osteoarthritis, unspecified site: Secondary | ICD-10-CM | POA: Diagnosis not present

## 2022-08-31 DIAGNOSIS — F339 Major depressive disorder, recurrent, unspecified: Secondary | ICD-10-CM | POA: Diagnosis not present

## 2022-08-31 DIAGNOSIS — F329 Major depressive disorder, single episode, unspecified: Secondary | ICD-10-CM | POA: Diagnosis not present

## 2022-08-31 DIAGNOSIS — M5412 Radiculopathy, cervical region: Secondary | ICD-10-CM | POA: Diagnosis not present

## 2022-09-03 IMAGING — US US RENAL
1 series · 14 of 25 positions shown · non-contrast
Comparison: Renal ultrasound 05/07/2020

CLINICAL DATA: CKD stage 3

EXAM:
RENAL / URINARY TRACT ULTRASOUND COMPLETE

[Series 1: us renal · 14 of 49 slices shown]
[im 1/49]
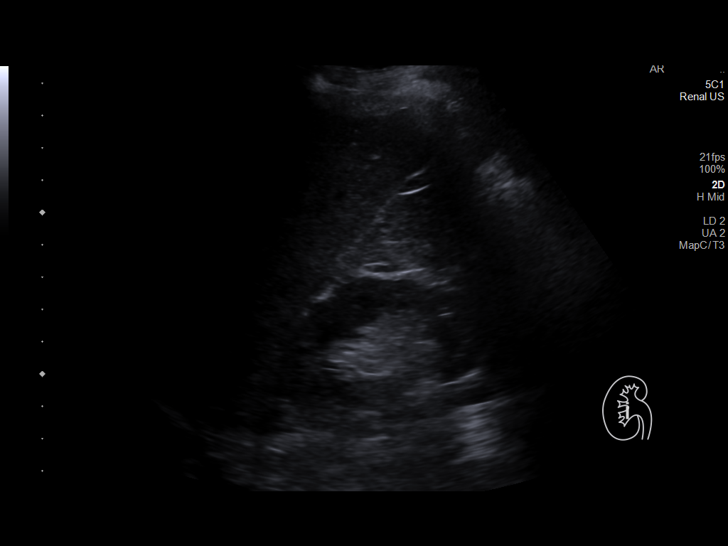
[im 5/49]
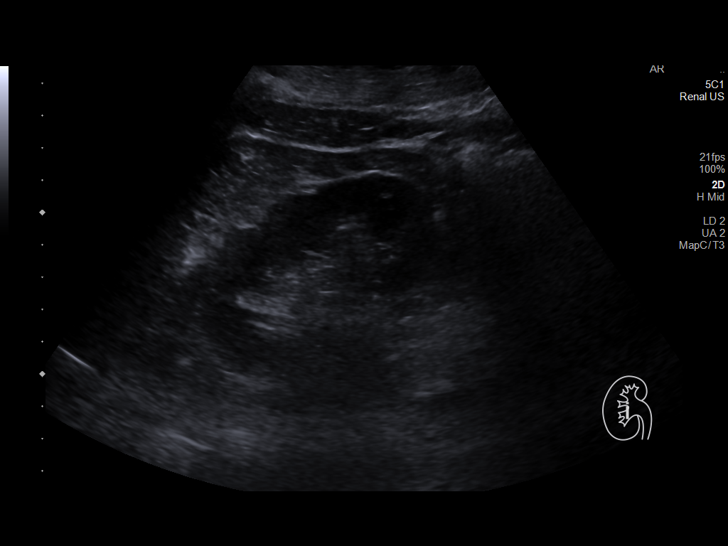
[im 9/49]
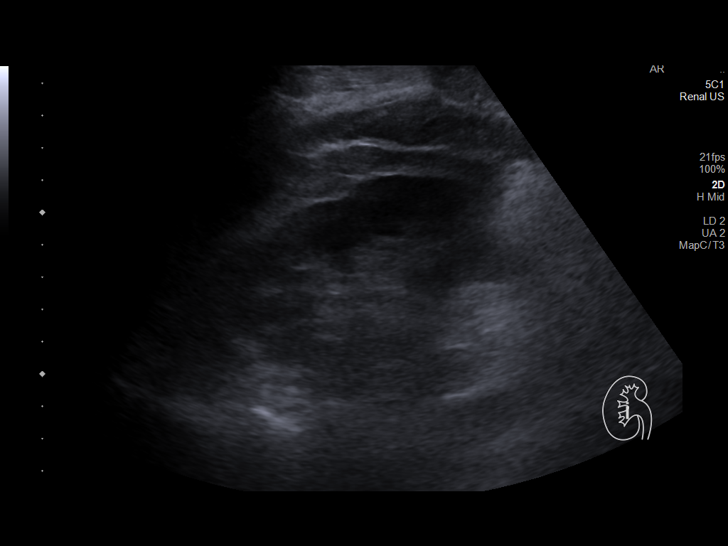
[im 13/49]
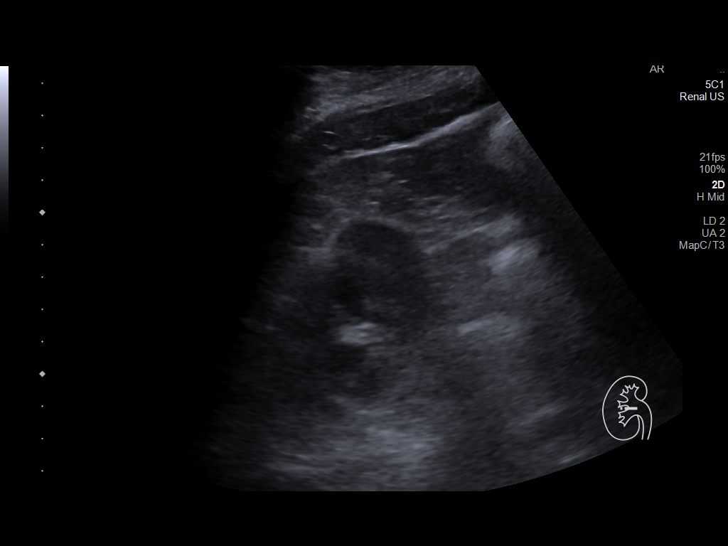
[im 17/49]
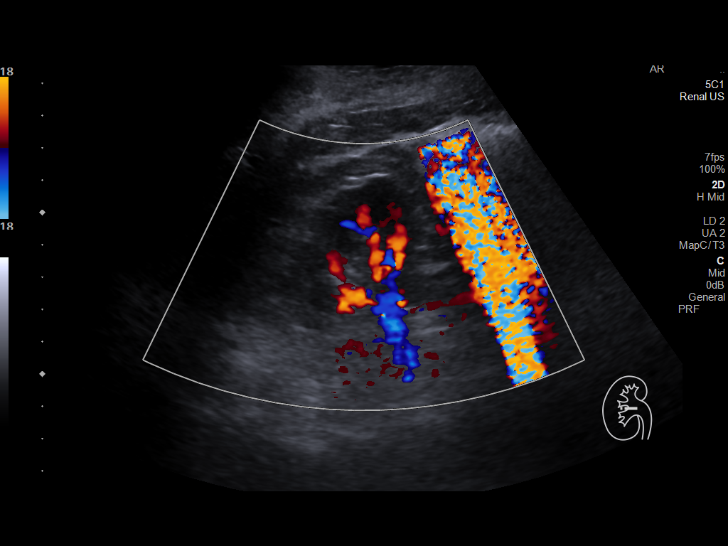
[im 19/49]
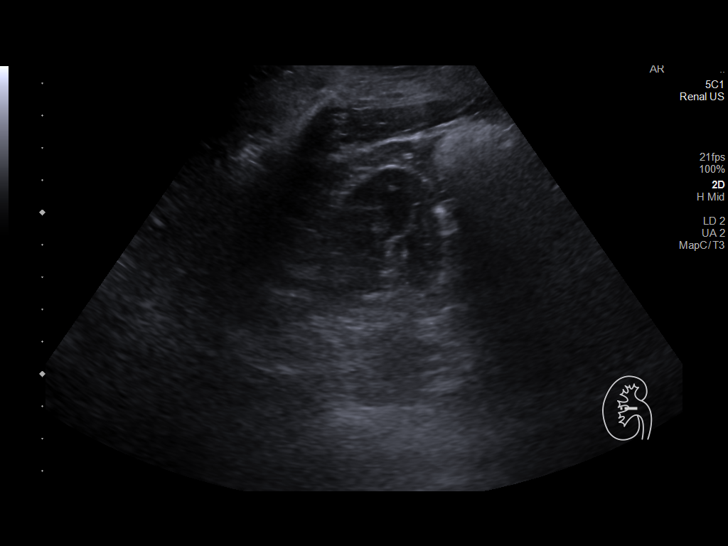
[im 23/49]
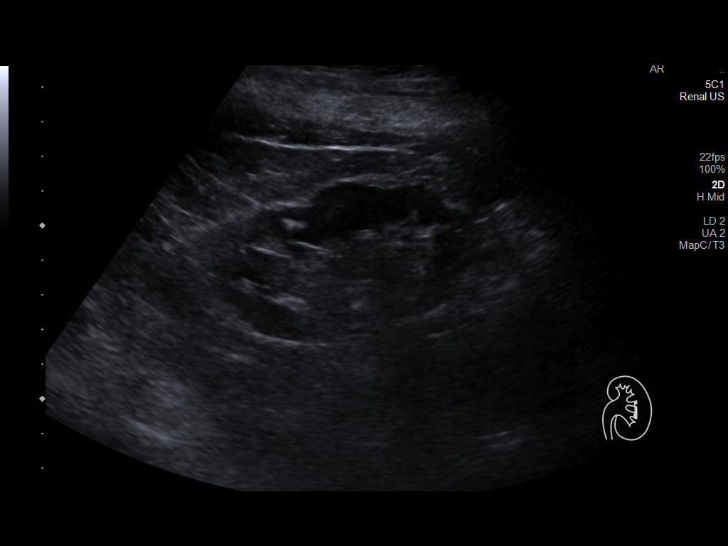
[im 27/49]
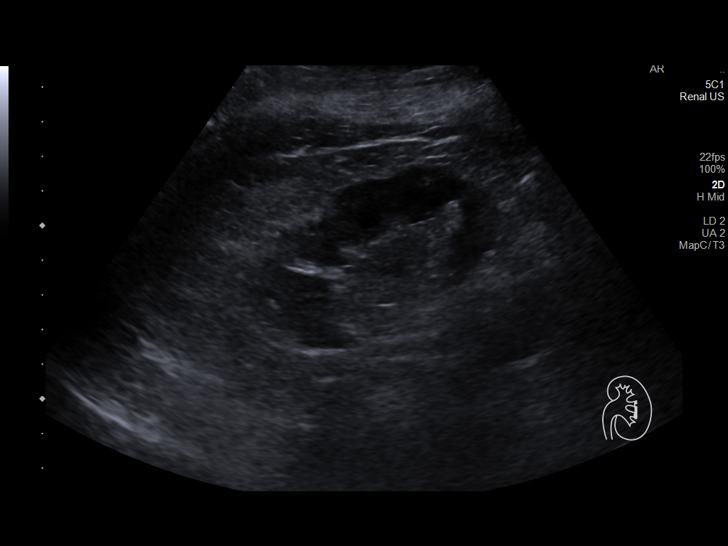
[im 31/49]
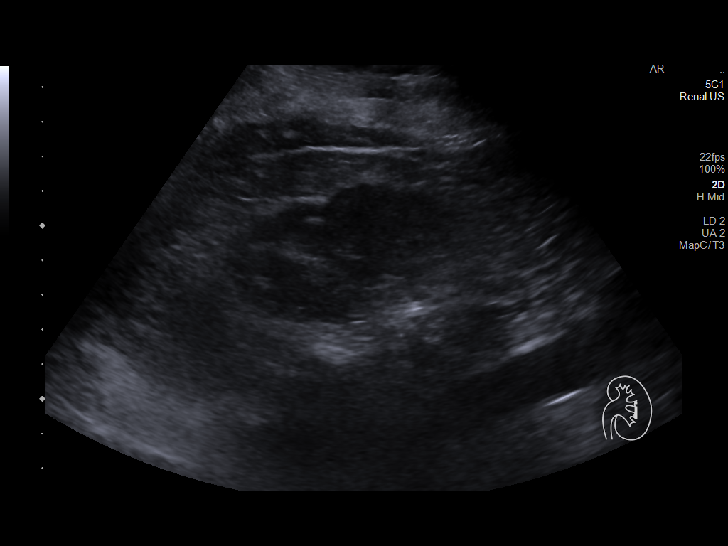
[im 33/49]
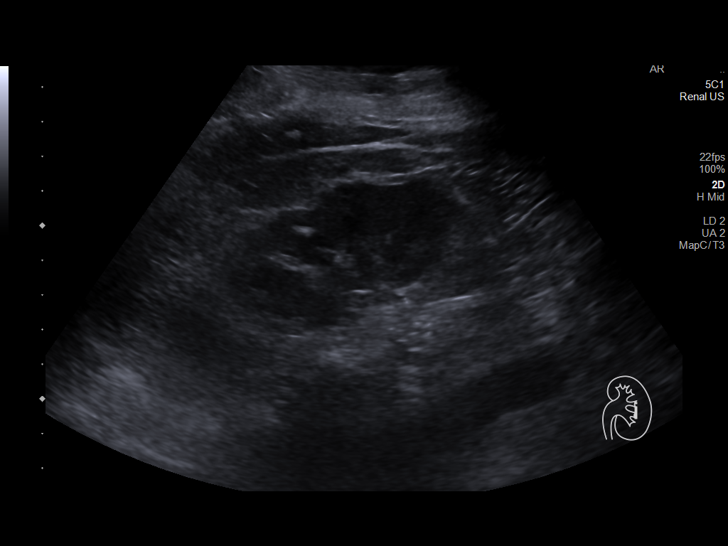
[im 37/49]
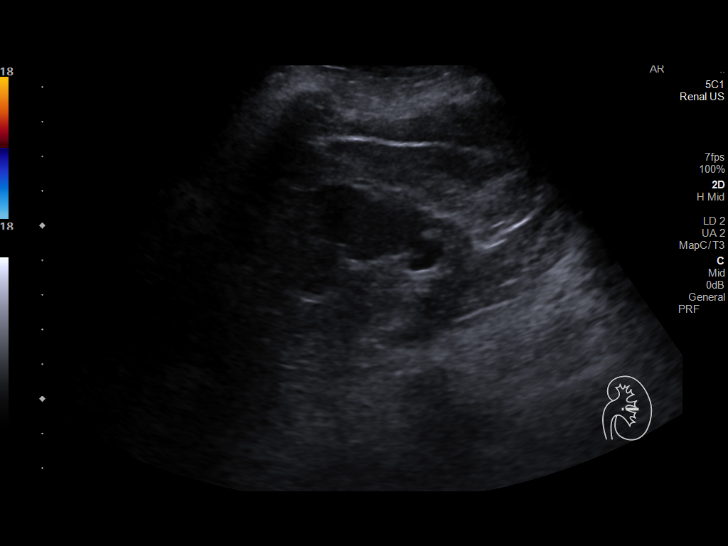
[im 41/49]
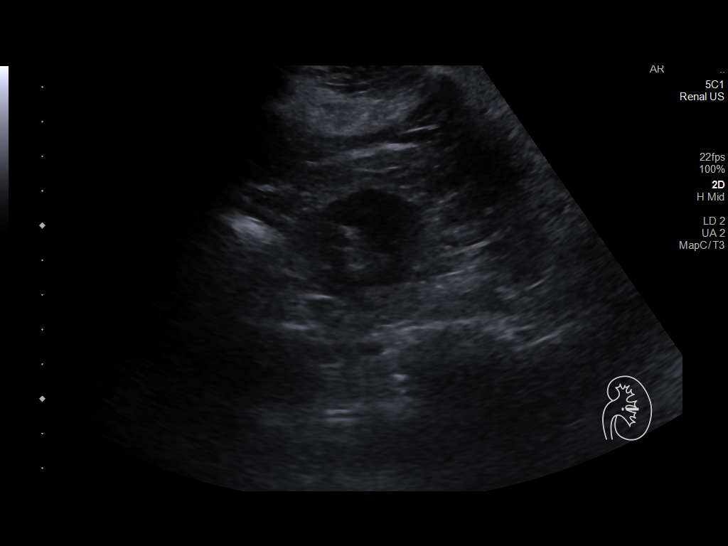
[im 45/49]
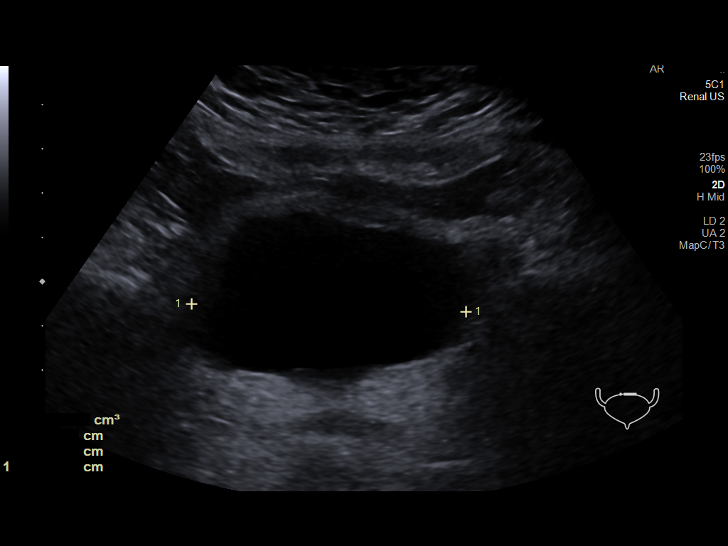
[im 49/49]
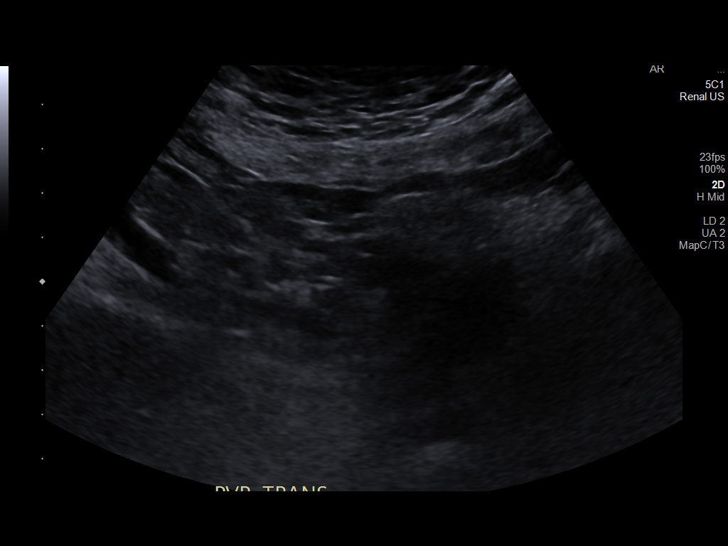

[14 of 25 positions shown; findings below may reference images not displayed]

FINDINGS: Right Kidney:

Renal measurements: 8.9 x 4.9 x 4.7 cm = volume: 108 is mL.
Echogenicity within normal limits. No mass or hydronephrosis
visualized.

Left Kidney:

Renal measurements: 8.6 x 4.8 x 4.0 cm = volume: 86 mL. Echogenicity
within normal limits. No mass or hydronephrosis visualized. There is
a 7 mm shadowing calculus in the superior pole.

Bladder:

Appears normal for degree of bladder distention. 48 cc prevoid and
complete emptying postvoid.

Other:

None.
IMPRESSION: 1. 7 mm nonobstructing calculus in the left kidney. No
hydronephrosis.

2.  Unremarkable appearance of the right kidney.

3.  Complete emptying of the urinary bladder postvoid.

## 2022-09-12 DIAGNOSIS — J020 Streptococcal pharyngitis: Secondary | ICD-10-CM | POA: Diagnosis not present

## 2022-09-12 DIAGNOSIS — I10 Essential (primary) hypertension: Secondary | ICD-10-CM | POA: Diagnosis not present

## 2022-09-12 DIAGNOSIS — N183 Chronic kidney disease, stage 3 unspecified: Secondary | ICD-10-CM | POA: Diagnosis not present

## 2022-09-12 DIAGNOSIS — Z299 Encounter for prophylactic measures, unspecified: Secondary | ICD-10-CM | POA: Diagnosis not present

## 2022-09-12 DIAGNOSIS — U071 COVID-19: Secondary | ICD-10-CM | POA: Diagnosis not present

## 2022-09-20 DIAGNOSIS — I10 Essential (primary) hypertension: Secondary | ICD-10-CM | POA: Diagnosis not present

## 2022-09-20 DIAGNOSIS — R55 Syncope and collapse: Secondary | ICD-10-CM | POA: Diagnosis not present

## 2022-09-20 DIAGNOSIS — Z299 Encounter for prophylactic measures, unspecified: Secondary | ICD-10-CM | POA: Diagnosis not present

## 2022-09-20 DIAGNOSIS — R0602 Shortness of breath: Secondary | ICD-10-CM | POA: Diagnosis not present

## 2022-09-22 DIAGNOSIS — R0602 Shortness of breath: Secondary | ICD-10-CM | POA: Diagnosis not present

## 2022-09-29 DIAGNOSIS — R55 Syncope and collapse: Secondary | ICD-10-CM | POA: Diagnosis not present

## 2022-09-30 DIAGNOSIS — E039 Hypothyroidism, unspecified: Secondary | ICD-10-CM | POA: Diagnosis not present

## 2022-09-30 DIAGNOSIS — I10 Essential (primary) hypertension: Secondary | ICD-10-CM | POA: Diagnosis not present

## 2022-09-30 DIAGNOSIS — Z299 Encounter for prophylactic measures, unspecified: Secondary | ICD-10-CM | POA: Diagnosis not present

## 2022-10-10 DIAGNOSIS — R55 Syncope and collapse: Secondary | ICD-10-CM | POA: Diagnosis not present

## 2022-10-13 DIAGNOSIS — R55 Syncope and collapse: Secondary | ICD-10-CM | POA: Diagnosis not present

## 2022-10-17 DIAGNOSIS — R55 Syncope and collapse: Secondary | ICD-10-CM | POA: Diagnosis not present

## 2022-10-27 DIAGNOSIS — N170 Acute kidney failure with tubular necrosis: Secondary | ICD-10-CM | POA: Diagnosis not present

## 2022-10-27 DIAGNOSIS — R809 Proteinuria, unspecified: Secondary | ICD-10-CM | POA: Diagnosis not present

## 2022-10-27 DIAGNOSIS — E876 Hypokalemia: Secondary | ICD-10-CM | POA: Diagnosis not present

## 2022-10-27 DIAGNOSIS — I129 Hypertensive chronic kidney disease with stage 1 through stage 4 chronic kidney disease, or unspecified chronic kidney disease: Secondary | ICD-10-CM | POA: Diagnosis not present

## 2022-10-27 DIAGNOSIS — N2581 Secondary hyperparathyroidism of renal origin: Secondary | ICD-10-CM | POA: Diagnosis not present

## 2022-10-27 DIAGNOSIS — D638 Anemia in other chronic diseases classified elsewhere: Secondary | ICD-10-CM | POA: Diagnosis not present

## 2022-10-27 DIAGNOSIS — N1832 Chronic kidney disease, stage 3b: Secondary | ICD-10-CM | POA: Diagnosis not present

## 2022-10-27 DIAGNOSIS — N200 Calculus of kidney: Secondary | ICD-10-CM | POA: Diagnosis not present

## 2022-11-04 DIAGNOSIS — Z1331 Encounter for screening for depression: Secondary | ICD-10-CM | POA: Diagnosis not present

## 2022-11-04 DIAGNOSIS — R5383 Other fatigue: Secondary | ICD-10-CM | POA: Diagnosis not present

## 2022-11-04 DIAGNOSIS — Z0000 Encounter for general adult medical examination without abnormal findings: Secondary | ICD-10-CM | POA: Diagnosis not present

## 2022-11-04 DIAGNOSIS — Z1339 Encounter for screening examination for other mental health and behavioral disorders: Secondary | ICD-10-CM | POA: Diagnosis not present

## 2022-11-04 DIAGNOSIS — Z299 Encounter for prophylactic measures, unspecified: Secondary | ICD-10-CM | POA: Diagnosis not present

## 2022-11-04 DIAGNOSIS — E7800 Pure hypercholesterolemia, unspecified: Secondary | ICD-10-CM | POA: Diagnosis not present

## 2022-11-04 DIAGNOSIS — M549 Dorsalgia, unspecified: Secondary | ICD-10-CM | POA: Diagnosis not present

## 2022-11-04 DIAGNOSIS — Z7189 Other specified counseling: Secondary | ICD-10-CM | POA: Diagnosis not present

## 2022-11-09 DIAGNOSIS — R5383 Other fatigue: Secondary | ICD-10-CM | POA: Diagnosis not present

## 2022-11-09 DIAGNOSIS — Z79899 Other long term (current) drug therapy: Secondary | ICD-10-CM | POA: Diagnosis not present

## 2022-11-09 DIAGNOSIS — E559 Vitamin D deficiency, unspecified: Secondary | ICD-10-CM | POA: Diagnosis not present

## 2022-11-09 DIAGNOSIS — E039 Hypothyroidism, unspecified: Secondary | ICD-10-CM | POA: Diagnosis not present

## 2022-11-09 DIAGNOSIS — E7800 Pure hypercholesterolemia, unspecified: Secondary | ICD-10-CM | POA: Diagnosis not present

## 2022-11-21 DIAGNOSIS — E876 Hypokalemia: Secondary | ICD-10-CM | POA: Diagnosis not present

## 2022-11-21 DIAGNOSIS — R93422 Abnormal radiologic findings on diagnostic imaging of left kidney: Secondary | ICD-10-CM | POA: Diagnosis not present

## 2022-11-21 DIAGNOSIS — N179 Acute kidney failure, unspecified: Secondary | ICD-10-CM | POA: Diagnosis not present

## 2022-11-21 DIAGNOSIS — I129 Hypertensive chronic kidney disease with stage 1 through stage 4 chronic kidney disease, or unspecified chronic kidney disease: Secondary | ICD-10-CM | POA: Diagnosis not present

## 2022-11-30 DIAGNOSIS — M797 Fibromyalgia: Secondary | ICD-10-CM | POA: Diagnosis not present

## 2022-11-30 DIAGNOSIS — M1991 Primary osteoarthritis, unspecified site: Secondary | ICD-10-CM | POA: Diagnosis not present

## 2022-11-30 DIAGNOSIS — M545 Low back pain, unspecified: Secondary | ICD-10-CM | POA: Diagnosis not present

## 2022-11-30 DIAGNOSIS — Z79899 Other long term (current) drug therapy: Secondary | ICD-10-CM | POA: Diagnosis not present

## 2022-11-30 DIAGNOSIS — M5412 Radiculopathy, cervical region: Secondary | ICD-10-CM | POA: Diagnosis not present

## 2022-11-30 DIAGNOSIS — F329 Major depressive disorder, single episode, unspecified: Secondary | ICD-10-CM | POA: Diagnosis not present

## 2022-11-30 DIAGNOSIS — F418 Other specified anxiety disorders: Secondary | ICD-10-CM | POA: Diagnosis not present

## 2022-11-30 DIAGNOSIS — G4700 Insomnia, unspecified: Secondary | ICD-10-CM | POA: Diagnosis not present

## 2022-11-30 DIAGNOSIS — M13862 Other specified arthritis, left knee: Secondary | ICD-10-CM | POA: Diagnosis not present

## 2022-12-01 ENCOUNTER — Ambulatory Visit (HOSPITAL_COMMUNITY)
Admission: RE | Admit: 2022-12-01 | Discharge: 2022-12-01 | Disposition: A | Discharge status: Home / Self Care | Payer: Medicare HMO | Source: Ambulatory Visit | Attending: Vascular Surgery | Admitting: Vascular Surgery

## 2022-12-01 ENCOUNTER — Other Ambulatory Visit (HOSPITAL_COMMUNITY): Payer: Self-pay | Admitting: Nephrology

## 2022-12-01 DIAGNOSIS — I10 Essential (primary) hypertension: Secondary | ICD-10-CM

## 2022-12-02 DIAGNOSIS — R809 Proteinuria, unspecified: Secondary | ICD-10-CM | POA: Diagnosis not present

## 2022-12-02 DIAGNOSIS — N1832 Chronic kidney disease, stage 3b: Secondary | ICD-10-CM | POA: Diagnosis not present

## 2022-12-02 DIAGNOSIS — D631 Anemia in chronic kidney disease: Secondary | ICD-10-CM | POA: Diagnosis not present

## 2022-12-02 DIAGNOSIS — Z79899 Other long term (current) drug therapy: Secondary | ICD-10-CM | POA: Diagnosis not present

## 2022-12-02 DIAGNOSIS — N200 Calculus of kidney: Secondary | ICD-10-CM | POA: Diagnosis not present

## 2022-12-06 DIAGNOSIS — N1832 Chronic kidney disease, stage 3b: Secondary | ICD-10-CM | POA: Diagnosis not present

## 2022-12-06 DIAGNOSIS — N170 Acute kidney failure with tubular necrosis: Secondary | ICD-10-CM | POA: Diagnosis not present

## 2022-12-06 DIAGNOSIS — R809 Proteinuria, unspecified: Secondary | ICD-10-CM | POA: Diagnosis not present

## 2022-12-06 DIAGNOSIS — E876 Hypokalemia: Secondary | ICD-10-CM | POA: Diagnosis not present

## 2022-12-08 DIAGNOSIS — I10 Essential (primary) hypertension: Secondary | ICD-10-CM | POA: Diagnosis not present

## 2022-12-08 DIAGNOSIS — N183 Chronic kidney disease, stage 3 unspecified: Secondary | ICD-10-CM | POA: Diagnosis not present

## 2022-12-08 DIAGNOSIS — E039 Hypothyroidism, unspecified: Secondary | ICD-10-CM | POA: Diagnosis not present

## 2022-12-08 DIAGNOSIS — Z299 Encounter for prophylactic measures, unspecified: Secondary | ICD-10-CM | POA: Diagnosis not present

## 2022-12-08 DIAGNOSIS — Z0000 Encounter for general adult medical examination without abnormal findings: Secondary | ICD-10-CM | POA: Diagnosis not present

## 2022-12-15 DIAGNOSIS — M461 Sacroiliitis, not elsewhere classified: Secondary | ICD-10-CM | POA: Diagnosis not present

## 2022-12-15 DIAGNOSIS — M4325 Fusion of spine, thoracolumbar region: Secondary | ICD-10-CM | POA: Diagnosis not present

## 2022-12-15 DIAGNOSIS — M5412 Radiculopathy, cervical region: Secondary | ICD-10-CM | POA: Diagnosis not present

## 2022-12-21 DIAGNOSIS — I10 Essential (primary) hypertension: Secondary | ICD-10-CM | POA: Diagnosis not present

## 2022-12-21 DIAGNOSIS — N390 Urinary tract infection, site not specified: Secondary | ICD-10-CM | POA: Diagnosis not present

## 2022-12-21 DIAGNOSIS — Z299 Encounter for prophylactic measures, unspecified: Secondary | ICD-10-CM | POA: Diagnosis not present

## 2022-12-21 DIAGNOSIS — R350 Frequency of micturition: Secondary | ICD-10-CM | POA: Diagnosis not present

## 2022-12-29 DIAGNOSIS — H34832 Tributary (branch) retinal vein occlusion, left eye, with macular edema: Secondary | ICD-10-CM | POA: Diagnosis not present

## 2022-12-29 DIAGNOSIS — H34812 Central retinal vein occlusion, left eye, with macular edema: Secondary | ICD-10-CM | POA: Diagnosis not present

## 2022-12-29 DIAGNOSIS — Z79899 Other long term (current) drug therapy: Secondary | ICD-10-CM | POA: Diagnosis not present

## 2023-01-03 DIAGNOSIS — G250 Essential tremor: Secondary | ICD-10-CM | POA: Diagnosis not present

## 2023-01-03 DIAGNOSIS — N183 Chronic kidney disease, stage 3 unspecified: Secondary | ICD-10-CM | POA: Diagnosis not present

## 2023-01-03 DIAGNOSIS — R8290 Unspecified abnormal findings in urine: Secondary | ICD-10-CM | POA: Diagnosis not present

## 2023-01-03 DIAGNOSIS — Z299 Encounter for prophylactic measures, unspecified: Secondary | ICD-10-CM | POA: Diagnosis not present

## 2023-01-03 DIAGNOSIS — I10 Essential (primary) hypertension: Secondary | ICD-10-CM | POA: Diagnosis not present

## 2023-01-20 DIAGNOSIS — I10 Essential (primary) hypertension: Secondary | ICD-10-CM | POA: Diagnosis not present

## 2023-01-20 DIAGNOSIS — K121 Other forms of stomatitis: Secondary | ICD-10-CM | POA: Diagnosis not present

## 2023-01-20 DIAGNOSIS — J020 Streptococcal pharyngitis: Secondary | ICD-10-CM | POA: Diagnosis not present

## 2023-01-20 DIAGNOSIS — Z299 Encounter for prophylactic measures, unspecified: Secondary | ICD-10-CM | POA: Diagnosis not present

## 2023-02-06 DIAGNOSIS — M5412 Radiculopathy, cervical region: Secondary | ICD-10-CM | POA: Diagnosis not present

## 2023-02-10 DIAGNOSIS — Z7982 Long term (current) use of aspirin: Secondary | ICD-10-CM | POA: Diagnosis not present

## 2023-02-10 DIAGNOSIS — R7303 Prediabetes: Secondary | ICD-10-CM | POA: Diagnosis not present

## 2023-02-10 DIAGNOSIS — H348322 Tributary (branch) retinal vein occlusion, left eye, stable: Secondary | ICD-10-CM | POA: Diagnosis not present

## 2023-02-10 DIAGNOSIS — H34832 Tributary (branch) retinal vein occlusion, left eye, with macular edema: Secondary | ICD-10-CM | POA: Diagnosis not present

## 2023-02-10 DIAGNOSIS — I10 Essential (primary) hypertension: Secondary | ICD-10-CM | POA: Diagnosis not present

## 2023-02-10 DIAGNOSIS — E785 Hyperlipidemia, unspecified: Secondary | ICD-10-CM | POA: Diagnosis not present

## 2023-02-21 DIAGNOSIS — Z299 Encounter for prophylactic measures, unspecified: Secondary | ICD-10-CM | POA: Diagnosis not present

## 2023-02-21 DIAGNOSIS — R350 Frequency of micturition: Secondary | ICD-10-CM | POA: Diagnosis not present

## 2023-02-21 DIAGNOSIS — I10 Essential (primary) hypertension: Secondary | ICD-10-CM | POA: Diagnosis not present

## 2023-02-21 DIAGNOSIS — N183 Chronic kidney disease, stage 3 unspecified: Secondary | ICD-10-CM | POA: Diagnosis not present

## 2023-03-09 DIAGNOSIS — N170 Acute kidney failure with tubular necrosis: Secondary | ICD-10-CM | POA: Diagnosis not present

## 2023-03-09 DIAGNOSIS — I129 Hypertensive chronic kidney disease with stage 1 through stage 4 chronic kidney disease, or unspecified chronic kidney disease: Secondary | ICD-10-CM | POA: Diagnosis not present

## 2023-03-09 DIAGNOSIS — N200 Calculus of kidney: Secondary | ICD-10-CM | POA: Diagnosis not present

## 2023-03-09 DIAGNOSIS — N1832 Chronic kidney disease, stage 3b: Secondary | ICD-10-CM | POA: Diagnosis not present

## 2023-03-09 DIAGNOSIS — R809 Proteinuria, unspecified: Secondary | ICD-10-CM | POA: Diagnosis not present

## 2023-03-09 DIAGNOSIS — N189 Chronic kidney disease, unspecified: Secondary | ICD-10-CM | POA: Diagnosis not present

## 2023-03-09 DIAGNOSIS — E611 Iron deficiency: Secondary | ICD-10-CM | POA: Diagnosis not present

## 2023-03-10 DIAGNOSIS — F418 Other specified anxiety disorders: Secondary | ICD-10-CM | POA: Diagnosis not present

## 2023-03-10 DIAGNOSIS — M797 Fibromyalgia: Secondary | ICD-10-CM | POA: Diagnosis not present

## 2023-03-10 DIAGNOSIS — M47896 Other spondylosis, lumbar region: Secondary | ICD-10-CM | POA: Diagnosis not present

## 2023-03-10 DIAGNOSIS — G4700 Insomnia, unspecified: Secondary | ICD-10-CM | POA: Diagnosis not present

## 2023-03-10 DIAGNOSIS — Z79899 Other long term (current) drug therapy: Secondary | ICD-10-CM | POA: Diagnosis not present

## 2023-03-10 DIAGNOSIS — F329 Major depressive disorder, single episode, unspecified: Secondary | ICD-10-CM | POA: Diagnosis not present

## 2023-03-10 DIAGNOSIS — M13862 Other specified arthritis, left knee: Secondary | ICD-10-CM | POA: Diagnosis not present

## 2023-03-10 DIAGNOSIS — G894 Chronic pain syndrome: Secondary | ICD-10-CM | POA: Diagnosis not present

## 2023-03-10 DIAGNOSIS — M1991 Primary osteoarthritis, unspecified site: Secondary | ICD-10-CM | POA: Diagnosis not present

## 2023-03-15 DIAGNOSIS — N200 Calculus of kidney: Secondary | ICD-10-CM | POA: Diagnosis not present

## 2023-03-15 DIAGNOSIS — N1832 Chronic kidney disease, stage 3b: Secondary | ICD-10-CM | POA: Diagnosis not present

## 2023-03-15 DIAGNOSIS — I129 Hypertensive chronic kidney disease with stage 1 through stage 4 chronic kidney disease, or unspecified chronic kidney disease: Secondary | ICD-10-CM | POA: Diagnosis not present

## 2023-03-15 DIAGNOSIS — R809 Proteinuria, unspecified: Secondary | ICD-10-CM | POA: Diagnosis not present

## 2023-03-17 IMAGING — DX DG KNEE 1-2V PORT*L*
2 series · 2 of 2 positions shown · non-contrast
Comparison: None.

CLINICAL DATA: Status post total knee replacement, left

EXAM:
PORTABLE LEFT KNEE - 1-2 VIEW

[knee ap]
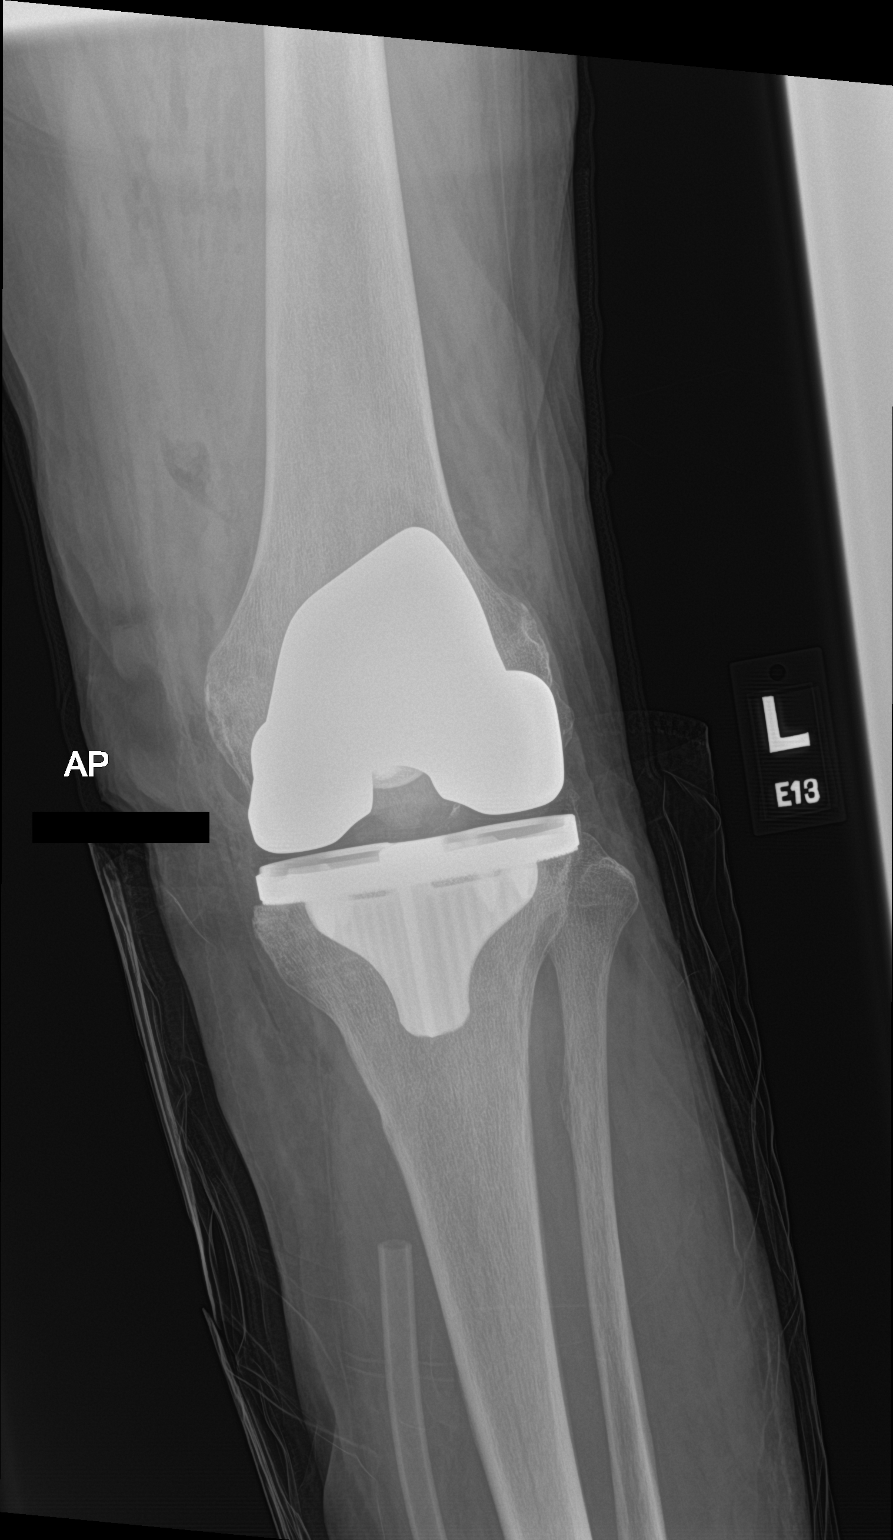

[knee lat]
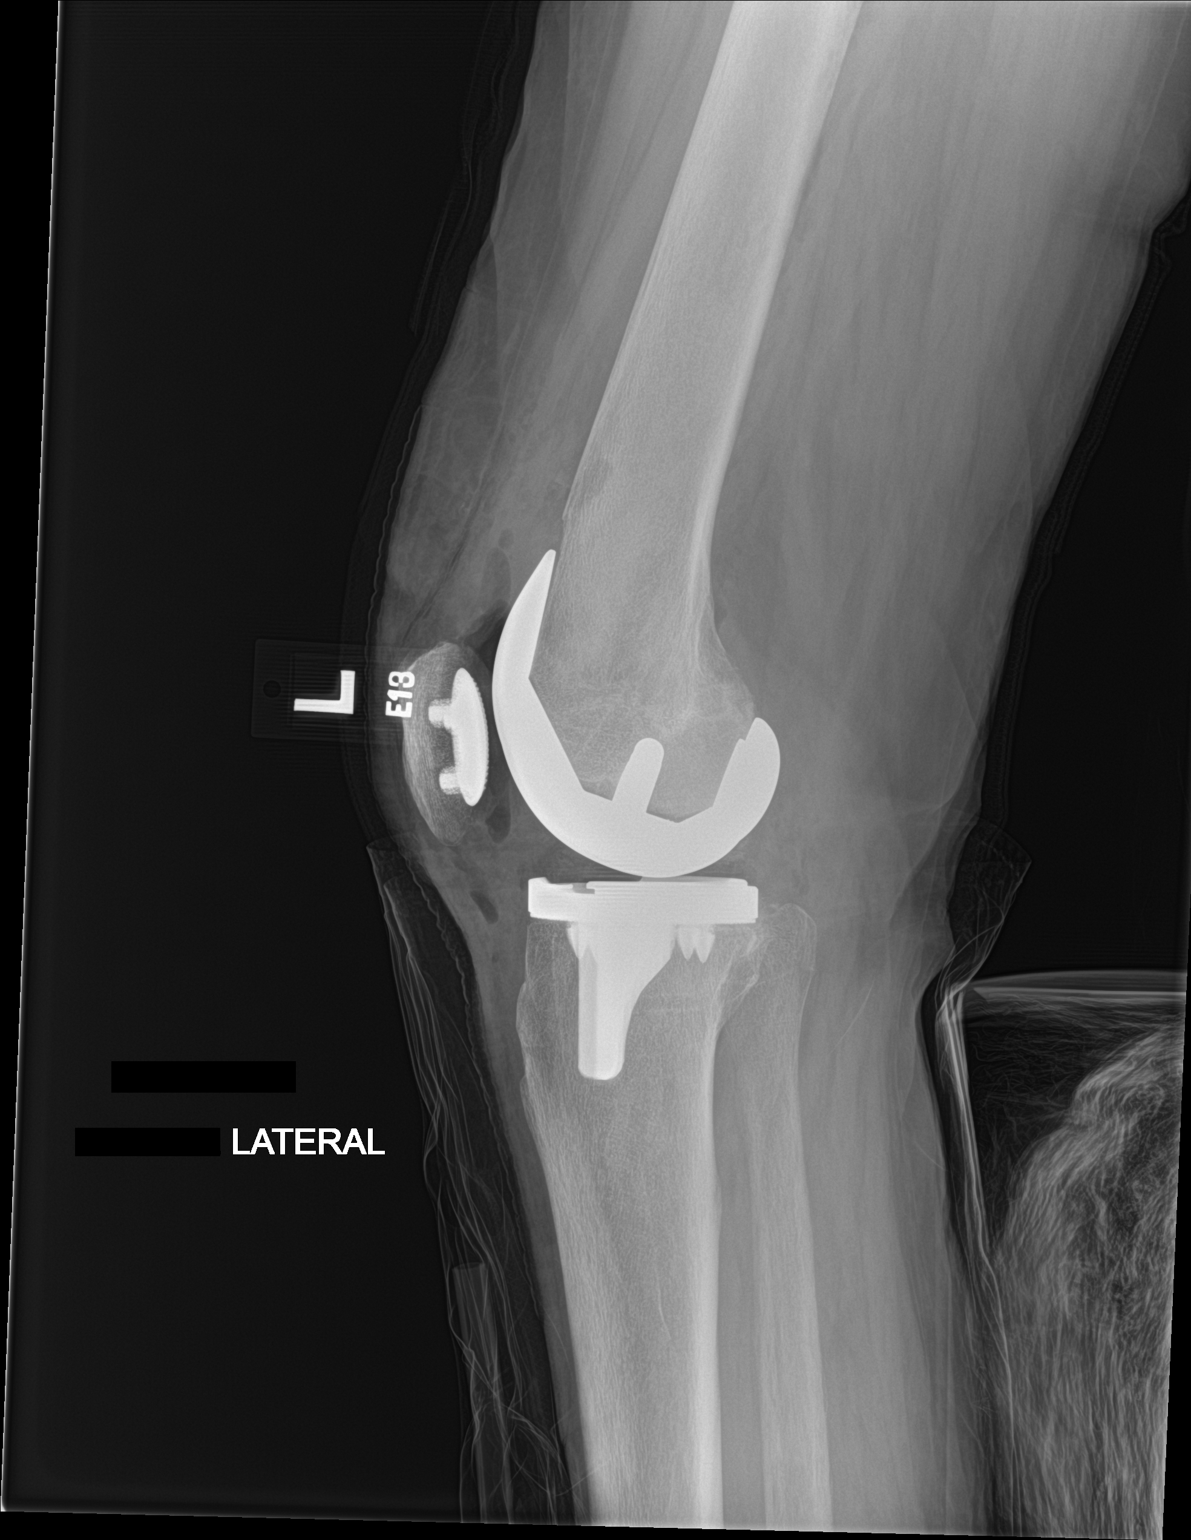

[2 of 2 positions shown; findings below may reference images not displayed]

FINDINGS: Left total knee arthroplasty. There is expected postoperative soft
tissue air and swelling. Evidence of complication.
IMPRESSION: Standard appearance of left total knee arthroplasty.

## 2023-03-31 DIAGNOSIS — Z299 Encounter for prophylactic measures, unspecified: Secondary | ICD-10-CM | POA: Diagnosis not present

## 2023-03-31 DIAGNOSIS — J020 Streptococcal pharyngitis: Secondary | ICD-10-CM | POA: Diagnosis not present

## 2023-03-31 DIAGNOSIS — J029 Acute pharyngitis, unspecified: Secondary | ICD-10-CM | POA: Diagnosis not present

## 2023-04-27 DIAGNOSIS — M25571 Pain in right ankle and joints of right foot: Secondary | ICD-10-CM | POA: Diagnosis not present

## 2023-04-27 DIAGNOSIS — M7989 Other specified soft tissue disorders: Secondary | ICD-10-CM | POA: Diagnosis not present

## 2023-04-27 DIAGNOSIS — M19071 Primary osteoarthritis, right ankle and foot: Secondary | ICD-10-CM | POA: Diagnosis not present

## 2023-04-27 DIAGNOSIS — Z23 Encounter for immunization: Secondary | ICD-10-CM | POA: Diagnosis not present

## 2023-04-27 DIAGNOSIS — Z299 Encounter for prophylactic measures, unspecified: Secondary | ICD-10-CM | POA: Diagnosis not present

## 2023-04-27 DIAGNOSIS — S99921D Unspecified injury of right foot, subsequent encounter: Secondary | ICD-10-CM | POA: Diagnosis not present

## 2023-04-27 DIAGNOSIS — I10 Essential (primary) hypertension: Secondary | ICD-10-CM | POA: Diagnosis not present

## 2023-04-27 DIAGNOSIS — R5383 Other fatigue: Secondary | ICD-10-CM | POA: Diagnosis not present

## 2023-04-27 DIAGNOSIS — M79671 Pain in right foot: Secondary | ICD-10-CM | POA: Diagnosis not present

## 2023-05-03 DIAGNOSIS — S82831D Other fracture of upper and lower end of right fibula, subsequent encounter for closed fracture with routine healing: Secondary | ICD-10-CM | POA: Diagnosis not present

## 2023-05-16 DIAGNOSIS — I10 Essential (primary) hypertension: Secondary | ICD-10-CM | POA: Diagnosis not present

## 2023-05-16 DIAGNOSIS — M549 Dorsalgia, unspecified: Secondary | ICD-10-CM | POA: Diagnosis not present

## 2023-05-16 DIAGNOSIS — N183 Chronic kidney disease, stage 3 unspecified: Secondary | ICD-10-CM | POA: Diagnosis not present

## 2023-05-16 DIAGNOSIS — Z299 Encounter for prophylactic measures, unspecified: Secondary | ICD-10-CM | POA: Diagnosis not present

## 2023-05-17 DIAGNOSIS — S82831D Other fracture of upper and lower end of right fibula, subsequent encounter for closed fracture with routine healing: Secondary | ICD-10-CM | POA: Diagnosis not present

## 2023-06-09 DIAGNOSIS — M47896 Other spondylosis, lumbar region: Secondary | ICD-10-CM | POA: Diagnosis not present

## 2023-06-09 DIAGNOSIS — G4700 Insomnia, unspecified: Secondary | ICD-10-CM | POA: Diagnosis not present

## 2023-06-09 DIAGNOSIS — M797 Fibromyalgia: Secondary | ICD-10-CM | POA: Diagnosis not present

## 2023-06-09 DIAGNOSIS — F418 Other specified anxiety disorders: Secondary | ICD-10-CM | POA: Diagnosis not present

## 2023-06-09 DIAGNOSIS — F329 Major depressive disorder, single episode, unspecified: Secondary | ICD-10-CM | POA: Diagnosis not present

## 2023-06-09 DIAGNOSIS — Z79899 Other long term (current) drug therapy: Secondary | ICD-10-CM | POA: Diagnosis not present

## 2023-06-09 DIAGNOSIS — M13862 Other specified arthritis, left knee: Secondary | ICD-10-CM | POA: Diagnosis not present

## 2023-06-16 DIAGNOSIS — R52 Pain, unspecified: Secondary | ICD-10-CM | POA: Diagnosis not present

## 2023-06-16 DIAGNOSIS — I10 Essential (primary) hypertension: Secondary | ICD-10-CM | POA: Diagnosis not present

## 2023-06-16 DIAGNOSIS — R3589 Other polyuria: Secondary | ICD-10-CM | POA: Diagnosis not present

## 2023-06-16 DIAGNOSIS — Z299 Encounter for prophylactic measures, unspecified: Secondary | ICD-10-CM | POA: Diagnosis not present

## 2023-06-16 DIAGNOSIS — F339 Major depressive disorder, recurrent, unspecified: Secondary | ICD-10-CM | POA: Diagnosis not present

## 2023-06-16 DIAGNOSIS — M797 Fibromyalgia: Secondary | ICD-10-CM | POA: Diagnosis not present

## 2023-06-16 DIAGNOSIS — R350 Frequency of micturition: Secondary | ICD-10-CM | POA: Diagnosis not present

## 2023-06-19 DIAGNOSIS — I10 Essential (primary) hypertension: Secondary | ICD-10-CM | POA: Diagnosis not present

## 2023-06-19 DIAGNOSIS — G4710 Hypersomnia, unspecified: Secondary | ICD-10-CM | POA: Diagnosis not present

## 2023-06-19 DIAGNOSIS — M797 Fibromyalgia: Secondary | ICD-10-CM | POA: Diagnosis not present

## 2023-06-19 DIAGNOSIS — Z299 Encounter for prophylactic measures, unspecified: Secondary | ICD-10-CM | POA: Diagnosis not present

## 2023-06-28 DIAGNOSIS — D638 Anemia in other chronic diseases classified elsewhere: Secondary | ICD-10-CM | POA: Diagnosis not present

## 2023-06-28 DIAGNOSIS — I129 Hypertensive chronic kidney disease with stage 1 through stage 4 chronic kidney disease, or unspecified chronic kidney disease: Secondary | ICD-10-CM | POA: Diagnosis not present

## 2023-06-28 DIAGNOSIS — R809 Proteinuria, unspecified: Secondary | ICD-10-CM | POA: Diagnosis not present

## 2023-06-28 DIAGNOSIS — N1832 Chronic kidney disease, stage 3b: Secondary | ICD-10-CM | POA: Diagnosis not present

## 2023-07-04 DIAGNOSIS — I10 Essential (primary) hypertension: Secondary | ICD-10-CM | POA: Diagnosis not present

## 2023-07-04 DIAGNOSIS — Z299 Encounter for prophylactic measures, unspecified: Secondary | ICD-10-CM | POA: Diagnosis not present

## 2023-07-04 DIAGNOSIS — R52 Pain, unspecified: Secondary | ICD-10-CM | POA: Diagnosis not present

## 2023-07-04 DIAGNOSIS — M797 Fibromyalgia: Secondary | ICD-10-CM | POA: Diagnosis not present

## 2023-07-05 DIAGNOSIS — D638 Anemia in other chronic diseases classified elsewhere: Secondary | ICD-10-CM | POA: Diagnosis not present

## 2023-07-05 DIAGNOSIS — I129 Hypertensive chronic kidney disease with stage 1 through stage 4 chronic kidney disease, or unspecified chronic kidney disease: Secondary | ICD-10-CM | POA: Diagnosis not present

## 2023-07-05 DIAGNOSIS — R809 Proteinuria, unspecified: Secondary | ICD-10-CM | POA: Diagnosis not present

## 2023-07-05 DIAGNOSIS — N200 Calculus of kidney: Secondary | ICD-10-CM | POA: Diagnosis not present

## 2023-07-17 DIAGNOSIS — I10 Essential (primary) hypertension: Secondary | ICD-10-CM | POA: Diagnosis not present

## 2023-07-17 DIAGNOSIS — Z299 Encounter for prophylactic measures, unspecified: Secondary | ICD-10-CM | POA: Diagnosis not present

## 2023-07-17 DIAGNOSIS — G4710 Hypersomnia, unspecified: Secondary | ICD-10-CM | POA: Diagnosis not present

## 2023-07-17 DIAGNOSIS — J340 Abscess, furuncle and carbuncle of nose: Secondary | ICD-10-CM | POA: Diagnosis not present

## 2023-07-17 DIAGNOSIS — M797 Fibromyalgia: Secondary | ICD-10-CM | POA: Diagnosis not present

## 2023-07-20 DIAGNOSIS — R52 Pain, unspecified: Secondary | ICD-10-CM | POA: Diagnosis not present

## 2023-07-20 DIAGNOSIS — I10 Essential (primary) hypertension: Secondary | ICD-10-CM | POA: Diagnosis not present

## 2023-07-20 DIAGNOSIS — Z299 Encounter for prophylactic measures, unspecified: Secondary | ICD-10-CM | POA: Diagnosis not present

## 2023-07-20 DIAGNOSIS — J019 Acute sinusitis, unspecified: Secondary | ICD-10-CM | POA: Diagnosis not present

## 2023-07-20 DIAGNOSIS — N183 Chronic kidney disease, stage 3 unspecified: Secondary | ICD-10-CM | POA: Diagnosis not present

## 2023-07-20 DIAGNOSIS — J340 Abscess, furuncle and carbuncle of nose: Secondary | ICD-10-CM | POA: Diagnosis not present

## 2023-07-20 DIAGNOSIS — F339 Major depressive disorder, recurrent, unspecified: Secondary | ICD-10-CM | POA: Diagnosis not present

## 2023-08-04 DIAGNOSIS — I10 Essential (primary) hypertension: Secondary | ICD-10-CM | POA: Diagnosis not present

## 2023-08-04 DIAGNOSIS — Z299 Encounter for prophylactic measures, unspecified: Secondary | ICD-10-CM | POA: Diagnosis not present

## 2023-08-04 DIAGNOSIS — N183 Chronic kidney disease, stage 3 unspecified: Secondary | ICD-10-CM | POA: Diagnosis not present

## 2023-08-04 DIAGNOSIS — M549 Dorsalgia, unspecified: Secondary | ICD-10-CM | POA: Diagnosis not present

## 2023-08-04 DIAGNOSIS — F339 Major depressive disorder, recurrent, unspecified: Secondary | ICD-10-CM | POA: Diagnosis not present

## 2023-08-14 DIAGNOSIS — I10 Essential (primary) hypertension: Secondary | ICD-10-CM | POA: Diagnosis not present

## 2023-08-14 DIAGNOSIS — Z299 Encounter for prophylactic measures, unspecified: Secondary | ICD-10-CM | POA: Diagnosis not present

## 2023-08-14 DIAGNOSIS — K5900 Constipation, unspecified: Secondary | ICD-10-CM | POA: Diagnosis not present

## 2023-08-14 DIAGNOSIS — G4710 Hypersomnia, unspecified: Secondary | ICD-10-CM | POA: Diagnosis not present

## 2023-08-17 DIAGNOSIS — M5412 Radiculopathy, cervical region: Secondary | ICD-10-CM | POA: Diagnosis not present

## 2023-08-22 DIAGNOSIS — M5412 Radiculopathy, cervical region: Secondary | ICD-10-CM | POA: Diagnosis not present

## 2023-09-04 DIAGNOSIS — K5903 Drug induced constipation: Secondary | ICD-10-CM | POA: Diagnosis not present

## 2023-09-04 DIAGNOSIS — Z299 Encounter for prophylactic measures, unspecified: Secondary | ICD-10-CM | POA: Diagnosis not present

## 2023-09-04 DIAGNOSIS — I10 Essential (primary) hypertension: Secondary | ICD-10-CM | POA: Diagnosis not present

## 2023-09-04 DIAGNOSIS — R52 Pain, unspecified: Secondary | ICD-10-CM | POA: Diagnosis not present

## 2023-09-04 DIAGNOSIS — M549 Dorsalgia, unspecified: Secondary | ICD-10-CM | POA: Diagnosis not present

## 2023-09-04 DIAGNOSIS — F339 Major depressive disorder, recurrent, unspecified: Secondary | ICD-10-CM | POA: Diagnosis not present

## 2023-09-06 DIAGNOSIS — H52223 Regular astigmatism, bilateral: Secondary | ICD-10-CM | POA: Diagnosis not present

## 2023-09-13 DIAGNOSIS — Z299 Encounter for prophylactic measures, unspecified: Secondary | ICD-10-CM | POA: Diagnosis not present

## 2023-09-13 DIAGNOSIS — F339 Major depressive disorder, recurrent, unspecified: Secondary | ICD-10-CM | POA: Diagnosis not present

## 2023-09-13 DIAGNOSIS — J069 Acute upper respiratory infection, unspecified: Secondary | ICD-10-CM | POA: Diagnosis not present

## 2023-09-13 DIAGNOSIS — J329 Chronic sinusitis, unspecified: Secondary | ICD-10-CM | POA: Diagnosis not present

## 2023-09-13 DIAGNOSIS — I10 Essential (primary) hypertension: Secondary | ICD-10-CM | POA: Diagnosis not present

## 2023-09-18 DIAGNOSIS — N183 Chronic kidney disease, stage 3 unspecified: Secondary | ICD-10-CM | POA: Diagnosis not present

## 2023-09-18 DIAGNOSIS — I10 Essential (primary) hypertension: Secondary | ICD-10-CM | POA: Diagnosis not present

## 2023-09-18 DIAGNOSIS — Z299 Encounter for prophylactic measures, unspecified: Secondary | ICD-10-CM | POA: Diagnosis not present

## 2023-09-18 DIAGNOSIS — R52 Pain, unspecified: Secondary | ICD-10-CM | POA: Diagnosis not present

## 2023-09-18 DIAGNOSIS — F339 Major depressive disorder, recurrent, unspecified: Secondary | ICD-10-CM | POA: Diagnosis not present

## 2023-10-02 DIAGNOSIS — M797 Fibromyalgia: Secondary | ICD-10-CM | POA: Diagnosis not present

## 2023-10-02 DIAGNOSIS — F909 Attention-deficit hyperactivity disorder, unspecified type: Secondary | ICD-10-CM | POA: Diagnosis not present

## 2023-10-02 DIAGNOSIS — I10 Essential (primary) hypertension: Secondary | ICD-10-CM | POA: Diagnosis not present

## 2023-10-02 DIAGNOSIS — R52 Pain, unspecified: Secondary | ICD-10-CM | POA: Diagnosis not present

## 2023-10-02 DIAGNOSIS — N183 Chronic kidney disease, stage 3 unspecified: Secondary | ICD-10-CM | POA: Diagnosis not present

## 2023-10-02 DIAGNOSIS — Z299 Encounter for prophylactic measures, unspecified: Secondary | ICD-10-CM | POA: Diagnosis not present

## 2023-10-04 DIAGNOSIS — N189 Chronic kidney disease, unspecified: Secondary | ICD-10-CM | POA: Diagnosis not present

## 2023-10-04 DIAGNOSIS — R809 Proteinuria, unspecified: Secondary | ICD-10-CM | POA: Diagnosis not present

## 2023-10-04 DIAGNOSIS — D7589 Other specified diseases of blood and blood-forming organs: Secondary | ICD-10-CM | POA: Diagnosis not present

## 2023-10-04 DIAGNOSIS — E611 Iron deficiency: Secondary | ICD-10-CM | POA: Diagnosis not present

## 2023-10-04 DIAGNOSIS — D631 Anemia in chronic kidney disease: Secondary | ICD-10-CM | POA: Diagnosis not present

## 2023-10-04 DIAGNOSIS — E211 Secondary hyperparathyroidism, not elsewhere classified: Secondary | ICD-10-CM | POA: Diagnosis not present

## 2023-10-13 DIAGNOSIS — N200 Calculus of kidney: Secondary | ICD-10-CM | POA: Diagnosis not present

## 2023-10-13 DIAGNOSIS — I129 Hypertensive chronic kidney disease with stage 1 through stage 4 chronic kidney disease, or unspecified chronic kidney disease: Secondary | ICD-10-CM | POA: Diagnosis not present

## 2023-10-13 DIAGNOSIS — N1832 Chronic kidney disease, stage 3b: Secondary | ICD-10-CM | POA: Diagnosis not present

## 2023-10-13 DIAGNOSIS — R809 Proteinuria, unspecified: Secondary | ICD-10-CM | POA: Diagnosis not present

## 2023-10-17 DIAGNOSIS — M797 Fibromyalgia: Secondary | ICD-10-CM | POA: Diagnosis not present

## 2023-10-17 DIAGNOSIS — Z96652 Presence of left artificial knee joint: Secondary | ICD-10-CM | POA: Diagnosis not present

## 2023-10-17 DIAGNOSIS — G894 Chronic pain syndrome: Secondary | ICD-10-CM | POA: Diagnosis not present

## 2023-10-17 DIAGNOSIS — Z79891 Long term (current) use of opiate analgesic: Secondary | ICD-10-CM | POA: Diagnosis not present

## 2023-10-17 DIAGNOSIS — M4326 Fusion of spine, lumbar region: Secondary | ICD-10-CM | POA: Diagnosis not present

## 2023-11-02 DIAGNOSIS — Z7189 Other specified counseling: Secondary | ICD-10-CM | POA: Diagnosis not present

## 2023-11-02 DIAGNOSIS — E039 Hypothyroidism, unspecified: Secondary | ICD-10-CM | POA: Diagnosis not present

## 2023-11-02 DIAGNOSIS — Z0000 Encounter for general adult medical examination without abnormal findings: Secondary | ICD-10-CM | POA: Diagnosis not present

## 2023-11-02 DIAGNOSIS — I10 Essential (primary) hypertension: Secondary | ICD-10-CM | POA: Diagnosis not present

## 2023-11-02 DIAGNOSIS — R5383 Other fatigue: Secondary | ICD-10-CM | POA: Diagnosis not present

## 2023-11-02 DIAGNOSIS — Z299 Encounter for prophylactic measures, unspecified: Secondary | ICD-10-CM | POA: Diagnosis not present

## 2023-11-02 DIAGNOSIS — R52 Pain, unspecified: Secondary | ICD-10-CM | POA: Diagnosis not present

## 2023-11-02 DIAGNOSIS — E7800 Pure hypercholesterolemia, unspecified: Secondary | ICD-10-CM | POA: Diagnosis not present

## 2023-11-06 DIAGNOSIS — E039 Hypothyroidism, unspecified: Secondary | ICD-10-CM | POA: Diagnosis not present

## 2023-11-06 DIAGNOSIS — R5383 Other fatigue: Secondary | ICD-10-CM | POA: Diagnosis not present

## 2023-11-06 DIAGNOSIS — E7800 Pure hypercholesterolemia, unspecified: Secondary | ICD-10-CM | POA: Diagnosis not present

## 2023-11-06 DIAGNOSIS — Z79899 Other long term (current) drug therapy: Secondary | ICD-10-CM | POA: Diagnosis not present

## 2023-11-14 DIAGNOSIS — Z96652 Presence of left artificial knee joint: Secondary | ICD-10-CM | POA: Diagnosis not present

## 2023-11-14 DIAGNOSIS — M4326 Fusion of spine, lumbar region: Secondary | ICD-10-CM | POA: Diagnosis not present

## 2023-11-14 DIAGNOSIS — M797 Fibromyalgia: Secondary | ICD-10-CM | POA: Diagnosis not present

## 2023-11-14 DIAGNOSIS — G894 Chronic pain syndrome: Secondary | ICD-10-CM | POA: Diagnosis not present

## 2023-12-04 DIAGNOSIS — E7800 Pure hypercholesterolemia, unspecified: Secondary | ICD-10-CM | POA: Diagnosis not present

## 2023-12-04 DIAGNOSIS — Z299 Encounter for prophylactic measures, unspecified: Secondary | ICD-10-CM | POA: Diagnosis not present

## 2023-12-04 DIAGNOSIS — I10 Essential (primary) hypertension: Secondary | ICD-10-CM | POA: Diagnosis not present

## 2023-12-07 DIAGNOSIS — R52 Pain, unspecified: Secondary | ICD-10-CM | POA: Diagnosis not present

## 2023-12-07 DIAGNOSIS — T50905A Adverse effect of unspecified drugs, medicaments and biological substances, initial encounter: Secondary | ICD-10-CM | POA: Diagnosis not present

## 2023-12-07 DIAGNOSIS — I10 Essential (primary) hypertension: Secondary | ICD-10-CM | POA: Diagnosis not present

## 2023-12-07 DIAGNOSIS — Z299 Encounter for prophylactic measures, unspecified: Secondary | ICD-10-CM | POA: Diagnosis not present

## 2023-12-07 DIAGNOSIS — M797 Fibromyalgia: Secondary | ICD-10-CM | POA: Diagnosis not present

## 2023-12-12 DIAGNOSIS — M4326 Fusion of spine, lumbar region: Secondary | ICD-10-CM | POA: Diagnosis not present

## 2023-12-12 DIAGNOSIS — Z96652 Presence of left artificial knee joint: Secondary | ICD-10-CM | POA: Diagnosis not present

## 2023-12-12 DIAGNOSIS — M797 Fibromyalgia: Secondary | ICD-10-CM | POA: Diagnosis not present

## 2023-12-12 DIAGNOSIS — G894 Chronic pain syndrome: Secondary | ICD-10-CM | POA: Diagnosis not present

## 2023-12-12 DIAGNOSIS — Z79891 Long term (current) use of opiate analgesic: Secondary | ICD-10-CM | POA: Diagnosis not present

## 2023-12-14 DIAGNOSIS — I10 Essential (primary) hypertension: Secondary | ICD-10-CM | POA: Diagnosis not present

## 2023-12-14 DIAGNOSIS — Z299 Encounter for prophylactic measures, unspecified: Secondary | ICD-10-CM | POA: Diagnosis not present

## 2023-12-14 DIAGNOSIS — R002 Palpitations: Secondary | ICD-10-CM | POA: Diagnosis not present

## 2023-12-14 DIAGNOSIS — R079 Chest pain, unspecified: Secondary | ICD-10-CM | POA: Diagnosis not present

## 2023-12-19 DIAGNOSIS — R002 Palpitations: Secondary | ICD-10-CM | POA: Diagnosis not present

## 2023-12-19 DIAGNOSIS — Z299 Encounter for prophylactic measures, unspecified: Secondary | ICD-10-CM | POA: Diagnosis not present

## 2023-12-19 DIAGNOSIS — I10 Essential (primary) hypertension: Secondary | ICD-10-CM | POA: Diagnosis not present

## 2023-12-26 DIAGNOSIS — K5900 Constipation, unspecified: Secondary | ICD-10-CM | POA: Diagnosis not present

## 2023-12-26 DIAGNOSIS — R110 Nausea: Secondary | ICD-10-CM | POA: Diagnosis not present

## 2023-12-26 DIAGNOSIS — Z299 Encounter for prophylactic measures, unspecified: Secondary | ICD-10-CM | POA: Diagnosis not present

## 2023-12-26 DIAGNOSIS — I10 Essential (primary) hypertension: Secondary | ICD-10-CM | POA: Diagnosis not present

## 2023-12-30 DIAGNOSIS — I10 Essential (primary) hypertension: Secondary | ICD-10-CM | POA: Diagnosis not present

## 2023-12-30 DIAGNOSIS — R110 Nausea: Secondary | ICD-10-CM | POA: Diagnosis not present

## 2024-01-01 DIAGNOSIS — Z299 Encounter for prophylactic measures, unspecified: Secondary | ICD-10-CM | POA: Diagnosis not present

## 2024-01-01 DIAGNOSIS — N183 Chronic kidney disease, stage 3 unspecified: Secondary | ICD-10-CM | POA: Diagnosis not present

## 2024-01-01 DIAGNOSIS — R52 Pain, unspecified: Secondary | ICD-10-CM | POA: Diagnosis not present

## 2024-01-01 DIAGNOSIS — M797 Fibromyalgia: Secondary | ICD-10-CM | POA: Diagnosis not present

## 2024-01-01 DIAGNOSIS — R110 Nausea: Secondary | ICD-10-CM | POA: Diagnosis not present

## 2024-01-01 DIAGNOSIS — I10 Essential (primary) hypertension: Secondary | ICD-10-CM | POA: Diagnosis not present

## 2024-01-06 ENCOUNTER — Emergency Department (HOSPITAL_COMMUNITY)

## 2024-01-06 ENCOUNTER — Emergency Department (HOSPITAL_COMMUNITY)
Admission: EM | Admit: 2024-01-06 | Discharge: 2024-01-06 | Disposition: A | Discharge status: Home / Self Care | Attending: Emergency Medicine | Admitting: Emergency Medicine

## 2024-01-06 ENCOUNTER — Encounter (HOSPITAL_COMMUNITY): Payer: Self-pay | Admitting: *Deleted

## 2024-01-06 ENCOUNTER — Other Ambulatory Visit: Payer: Self-pay

## 2024-01-06 DIAGNOSIS — E039 Hypothyroidism, unspecified: Secondary | ICD-10-CM | POA: Insufficient documentation

## 2024-01-06 DIAGNOSIS — R110 Nausea: Secondary | ICD-10-CM | POA: Diagnosis not present

## 2024-01-06 DIAGNOSIS — R0989 Other specified symptoms and signs involving the circulatory and respiratory systems: Secondary | ICD-10-CM | POA: Diagnosis not present

## 2024-01-06 DIAGNOSIS — R9389 Abnormal findings on diagnostic imaging of other specified body structures: Secondary | ICD-10-CM | POA: Diagnosis not present

## 2024-01-06 DIAGNOSIS — R918 Other nonspecific abnormal finding of lung field: Secondary | ICD-10-CM | POA: Diagnosis not present

## 2024-01-06 DIAGNOSIS — Z981 Arthrodesis status: Secondary | ICD-10-CM | POA: Diagnosis not present

## 2024-01-06 DIAGNOSIS — R079 Chest pain, unspecified: Secondary | ICD-10-CM | POA: Diagnosis not present

## 2024-01-06 DIAGNOSIS — I10 Essential (primary) hypertension: Secondary | ICD-10-CM | POA: Insufficient documentation

## 2024-01-06 DIAGNOSIS — Z79899 Other long term (current) drug therapy: Secondary | ICD-10-CM | POA: Diagnosis not present

## 2024-01-06 LAB — CBC WITH DIFFERENTIAL/PLATELET
Abs Immature Granulocytes: 0.02 10*3/uL (ref 0.00–0.07)
Basophils Absolute: 0.1 10*3/uL (ref 0.0–0.1)
Basophils Relative: 1 %
Eosinophils Absolute: 0.2 10*3/uL (ref 0.0–0.5)
Eosinophils Relative: 4 %
HCT: 36.7 % (ref 36.0–46.0)
Hemoglobin: 11.9 g/dL — ABNORMAL LOW (ref 12.0–15.0)
Immature Granulocytes: 0 %
Lymphocytes Relative: 24 %
Lymphs Abs: 1.4 10*3/uL (ref 0.7–4.0)
MCH: 30 pg (ref 26.0–34.0)
MCHC: 32.4 g/dL (ref 30.0–36.0)
MCV: 92.4 fL (ref 80.0–100.0)
Monocytes Absolute: 0.6 10*3/uL (ref 0.1–1.0)
Monocytes Relative: 9 %
Neutro Abs: 3.8 10*3/uL (ref 1.7–7.7)
Neutrophils Relative %: 62 %
Platelets: 202 10*3/uL (ref 150–400)
RBC: 3.97 MIL/uL (ref 3.87–5.11)
RDW: 12.8 % (ref 11.5–15.5)
WBC: 6.1 10*3/uL (ref 4.0–10.5)
nRBC: 0 % (ref 0.0–0.2)

## 2024-01-06 LAB — COMPREHENSIVE METABOLIC PANEL WITH GFR
ALT: 21 U/L (ref 0–44)
AST: 24 U/L (ref 15–41)
Albumin: 3.6 g/dL (ref 3.5–5.0)
Alkaline Phosphatase: 57 U/L (ref 38–126)
Anion gap: 10 (ref 5–15)
BUN: 17 mg/dL (ref 8–23)
CO2: 29 mmol/L (ref 22–32)
Calcium: 9.1 mg/dL (ref 8.9–10.3)
Chloride: 99 mmol/L (ref 98–111)
Creatinine, Ser: 1.58 mg/dL — ABNORMAL HIGH (ref 0.44–1.00)
GFR, Estimated: 34 mL/min — ABNORMAL LOW (ref >60)
Glucose, Bld: 117 mg/dL — ABNORMAL HIGH (ref 70–99)
Potassium: 3.9 mmol/L (ref 3.5–5.1)
Sodium: 138 mmol/L (ref 135–145)
Total Bilirubin: 0.6 mg/dL (ref 0.0–1.2)
Total Protein: 6.8 g/dL (ref 6.5–8.1)

## 2024-01-06 LAB — URINALYSIS, ROUTINE W REFLEX MICROSCOPIC
Bilirubin Urine: NEGATIVE
Glucose, UA: NEGATIVE mg/dL
Hgb urine dipstick: NEGATIVE
Ketones, ur: NEGATIVE mg/dL
Leukocytes,Ua: NEGATIVE
Nitrite: NEGATIVE
Protein, ur: NEGATIVE mg/dL
Specific Gravity, Urine: 1.012 (ref 1.005–1.030)
pH: 6 (ref 5.0–8.0)

## 2024-01-06 LAB — TROPONIN I (HIGH SENSITIVITY): Troponin I (High Sensitivity): 6 ng/L (ref <18)

## 2024-01-06 LAB — LIPASE, BLOOD: Lipase: 26 U/L (ref 11–51)

## 2024-01-06 MED ORDER — SODIUM CHLORIDE 0.9 % IV SOLN: INTRAVENOUS | Status: DC

## 2024-01-06 MED ORDER — ONDANSETRON HCL 4 MG/2ML IJ SOLN
4.0000 mg | Freq: Once | INTRAMUSCULAR | Status: AC
  Filled 2024-01-06: qty 2

## 2024-01-06 MED ORDER — SCOPOLAMINE 1 MG/3DAYS TD PT72: 1.0000 | MEDICATED_PATCH | TRANSDERMAL | 12 refills | Status: DC

## 2024-01-06 NOTE — ED Triage Notes (Signed)
 Here by POV from home for nausea and L jaw/arm pain. Onset 2 weeks ago. Seen by Dr. Bernetta Brilliant and verbalizes its not my heart. Denies CP, NVD, fever, cough, recent illness, sob, dizziness, syncope, sweats. Alert, NAD, calm, interactive, resps e/u, skin W&D. Rates pain 3/10.

## 2024-01-06 NOTE — Discharge Instructions (Addendum)
 Your testing here has been reassuring, there was no abnormal findings.  I would strongly recommend that you follow-up with your ultrasound as ordered by your doctor.  I have also given you a prescription for a medication called scopolamine, this can be used safely with the Zofran  which you were given by your doctor.  Take the patch and place it behind your ear, you can replace it every 3 days.  If you need to switch it out for a new one after 3 days, please make sure that you take off the current patch and wash the skin before you put the new one on.  Thank you for allowing us  to treat you in the emergency department today.  After reviewing your examination and potential testing that was done it appears that you are safe to go home.  I would like for you to follow-up with your doctor within the next several days, have them obtain your records and follow-up with them to review all potential tests and results from your visit.  If you should develop severe or worsening symptoms return to the emergency department immediately  Please tell your doctor about your abnormal x-ray, they will need to repeat the x-ray within 2 weeks

## 2024-01-06 NOTE — ED Provider Notes (Signed)
 [place] EMERGENCY DEPARTMENT AT [facility] Provider Note   CSN: [id number] Arrival date & time: 01/06/24  0944     Patient presents with: Nausea   Christine Burns is a 78 y.o. female.   HPI   This patient is a 78 year old female, she has a history of hypertension on propranolol , lisinopril and amlodipine .  She has hypothyroidism and takes methadone , she presents with a complaint of nausea which has been going on for 2 weeks, it is not postprandial, it is constant 24 hours a day, it is not associated with any abdominal pain or chest pain or shortness of breath.  She does report some vague left-sided arm discomfort for the last 2 weeks which is mild, it does not seem to get worse with moving the arm and is not associated with any numbness or weakness of the arm.  She does have some chronic neck pain for which she thinks she might have a pinched nerve.  She has no exertional symptoms, she did see her doctor who ordered an ultrasound of her abdomen but did not do any blood work according to the patient's report.  The ultrasound has not yet been completed.  She has been taking Zofran  with minimal improvement.  She is not vomiting  Yesterday she ate a protein bar and an ice cream cone and did not vomit.  Denies diarrhea  Prior to Admission medications   Medication Sig Start Date End Date Taking? Authorizing Provider  amLODipine  (NORVASC ) 2.5 MG tablet Take 2.5 mg by mouth daily. 12/30/20   [provider]  Cyanocobalamin  (B-12 PO) Take 1 tablet by mouth daily.    [provider]  DULoxetine  (CYMBALTA ) 30 MG capsule Take 1 capsule (30 mg total) by mouth 2 (two) times daily. Patient not taking: Reported on 03/31/2021 10/24/15   Ditty, Raelene Bullocks, MD  DULoxetine  (CYMBALTA ) 60 MG capsule Take 60 mg by mouth daily. 09/04/15   [provider]  Ferrous Sulfate (IRON PO) Take 1 tablet by mouth daily.    [provider]  fluticasone  (FLONASE ) 50 MCG/ACT  nasal spray Place 1-2 sprays into both nostrils daily as needed for allergies. 07/30/20   [provider]  levothyroxine  (SYNTHROID , LEVOTHROID) 100 MCG tablet Take 100 mcg by mouth daily. 09/04/15   [provider]  lisinopril (ZESTRIL) 2.5 MG tablet Take 2.5 mg by mouth daily. 03/19/21   [provider]  loratadine  (CLARITIN ) 10 MG tablet Take 10 mg by mouth daily as needed for allergies.     [provider]  methadone  (DOLOPHINE ) 10 MG tablet Take 20 mg by mouth 2 (two) times daily. 07/28/20   [provider]  nystatin (MYCOSTATIN) 100000 UNIT/ML suspension  07/30/20   [provider]  ondansetron  (ZOFRAN ) 4 MG tablet Take 1 tablet (4 mg total) by mouth every 6 (six) hours as needed for nausea. 04/09/21   Swinteck, Polly Brink, MD  pregabalin  (LYRICA ) 100 MG capsule Take 100 mg by mouth daily. 03/27/21   [provider]  propranolol  ER (INDERAL  LA) 120 MG 24 hr capsule Take 120 mg by mouth daily. 01/16/21   [provider]  traZODone  (DESYREL ) 100 MG tablet Take 100 mg by mouth at bedtime. 06/29/06   [provider]    Allergies: Fentanyl , Ibuprofen, Zolpidem , Cyclobenzaprine , Diazepam , Morphine and codeine, Zolpidem  tartrate, Metaxalone, Meperidine, and Morphine    Review of Systems  All other systems reviewed and are negative.   Updated Vital Signs BP (!) 164/96 (  BP Location: Left Arm)   Pulse 80   Temp 99 F (37.2 C) (Oral)   Resp 14   Wt 68 kg   SpO2 92%   BMI 27.44 kg/m   Physical Exam Vitals and nursing note reviewed.  Constitutional:      General: She is not in acute distress.    Appearance: She is well-developed.  HENT:     Head: Normocephalic and atraumatic.     Mouth/Throat:     Pharynx: No oropharyngeal exudate.   Eyes:     General: No scleral icterus.       Right eye: No discharge.        Left eye: No discharge.     Conjunctiva/sclera: Conjunctivae normal.     Pupils: Pupils are equal, round, and  reactive to light.   Neck:     Thyroid : No thyromegaly.     Vascular: No JVD.   Cardiovascular:     Rate and Rhythm: Normal rate and regular rhythm.     Heart sounds: Normal heart sounds. No murmur heard.    No friction rub. No gallop.  Pulmonary:     Effort: Pulmonary effort is normal. No respiratory distress.     Breath sounds: Normal breath sounds. No wheezing or rales.  Abdominal:     General: Bowel sounds are normal. There is no distension.     Palpations: Abdomen is soft. There is no mass.     Tenderness: There is no abdominal tenderness.   Musculoskeletal:        General: No tenderness. Normal range of motion.     Cervical back: Normal range of motion and neck supple.  Lymphadenopathy:     Cervical: No cervical adenopathy.   Skin:    General: Skin is warm and dry.     Findings: No erythema or rash.   Neurological:     Mental Status: She is alert.     Coordination: Coordination normal.     Comments: The patient has totally normal strength and sensation of the bilateral upper extremities, there is no redness tenderness or swelling and there is normal pulses at the bilateral wrists.  Psychiatric:        Behavior: Behavior normal.     (all labs ordered are listed, but only abnormal results are displayed) Labs Reviewed  CBC WITH DIFFERENTIAL/PLATELET  COMPREHENSIVE METABOLIC PANEL WITH GFR  LIPASE, BLOOD  TROPONIN I (HIGH SENSITIVITY)    EKG: None  Radiology: No results found.   Procedures   Medications Ordered in the ED  ondansetron  (ZOFRAN ) injection 4 mg (has no administration in time range)  0.9 %  sodium chloride  infusion (has no administration in time range)                                    Medical Decision Making Amount and/or Complexity of Data Reviewed Labs: ordered. Radiology: ordered.  Risk Prescription drug management.  +  This patient presents to the ED for concern of nausea, this involves an extensive number of treatment  options, and is a complaint that carries with it a high risk of complications and morbidity.  The differential diagnosis includes electrolyte abnormalities, the patient has known stage III chronic kidney disease, could be a urine infection, could be gallbladder related, unlikely to be heart but with the left arm symptoms we will also check a troponin and EKG   Co morbidities /  Chronic conditions that complicate the patient evaluation  Chronic hypertension   Additional history obtained:  Additional history obtained from EMR External records from outside source obtained and reviewed including medical record including prior medications and visits to the ER, visits to her family doctor, there does not appear to be any recent admissions to the hospital going back several years, the last admission was 3 years ago when she was admitted for surgery on her knee for a knee replacement   Lab Tests:  I Ordered, and personally interpreted labs.  The pertinent results include: Unremarkable laboratory workup without severe renal failure infections or leukocytosis or anemia, troponin normal   Imaging Studies ordered:  I ordered imaging studies including chest x-ray I independently visualized and interpreted imaging which showed previous spinal fusion, no significant pulmonary abnormalities seen, there is a slight density somewhere in the retrocardiac area, patient can follow-up for repeat x-ray  I agree with the radiologist interpretation  + Cardiac Monitoring: / EKG:  The patient was maintained on a cardiac monitor.  I personally viewed and interpreted the cardiac monitored which showed an underlying rhythm of: Sinus rhythm   Problem List / ED Course / Critical interventions / Medication management  Patient without acute findings, given medications here including IV fluids and Zofran  I ordered medication including IV fluids and Zofran  Reevaluation of the patient after these medicines showed that  the patient improved I have reviewed the patients home medicines and have made adjustments as needed  Outpatient follow-up  Social Determinants of Health:  None   Test / Admission - Considered:  Patient stable for discharge, no acute findings on workup, the patient is agreeable to the plan.  I have discussed with the patient at the bedside the results, and the meaning of these results.  They have had opportunity to ask questions,  expressed their understanding to the need for follow-up with primary care physician      Final diagnoses:  None    ED Discharge Orders     None          Early Glisson, MD 01/06/24 1156

## 2024-01-08 DIAGNOSIS — M797 Fibromyalgia: Secondary | ICD-10-CM | POA: Diagnosis not present

## 2024-01-08 DIAGNOSIS — Z79891 Long term (current) use of opiate analgesic: Secondary | ICD-10-CM | POA: Diagnosis not present

## 2024-01-08 DIAGNOSIS — R932 Abnormal findings on diagnostic imaging of liver and biliary tract: Secondary | ICD-10-CM | POA: Diagnosis not present

## 2024-01-08 DIAGNOSIS — Z96652 Presence of left artificial knee joint: Secondary | ICD-10-CM | POA: Diagnosis not present

## 2024-01-08 DIAGNOSIS — R110 Nausea: Secondary | ICD-10-CM | POA: Diagnosis not present

## 2024-01-08 DIAGNOSIS — G894 Chronic pain syndrome: Secondary | ICD-10-CM | POA: Diagnosis not present

## 2024-01-08 DIAGNOSIS — M4326 Fusion of spine, lumbar region: Secondary | ICD-10-CM | POA: Diagnosis not present

## 2024-01-11 DIAGNOSIS — N189 Chronic kidney disease, unspecified: Secondary | ICD-10-CM | POA: Diagnosis not present

## 2024-01-11 DIAGNOSIS — R809 Proteinuria, unspecified: Secondary | ICD-10-CM | POA: Diagnosis not present

## 2024-01-11 DIAGNOSIS — D631 Anemia in chronic kidney disease: Secondary | ICD-10-CM | POA: Diagnosis not present

## 2024-01-12 DIAGNOSIS — M797 Fibromyalgia: Secondary | ICD-10-CM | POA: Diagnosis not present

## 2024-01-12 DIAGNOSIS — R110 Nausea: Secondary | ICD-10-CM | POA: Diagnosis not present

## 2024-01-12 DIAGNOSIS — N183 Chronic kidney disease, stage 3 unspecified: Secondary | ICD-10-CM | POA: Diagnosis not present

## 2024-01-12 DIAGNOSIS — I10 Essential (primary) hypertension: Secondary | ICD-10-CM | POA: Diagnosis not present

## 2024-01-12 DIAGNOSIS — Z299 Encounter for prophylactic measures, unspecified: Secondary | ICD-10-CM | POA: Diagnosis not present

## 2024-01-12 DIAGNOSIS — R1013 Epigastric pain: Secondary | ICD-10-CM | POA: Diagnosis not present

## 2024-01-16 ENCOUNTER — Encounter (INDEPENDENT_AMBULATORY_CARE_PROVIDER_SITE_OTHER): Payer: Self-pay | Admitting: *Deleted

## 2024-01-17 DIAGNOSIS — D638 Anemia in other chronic diseases classified elsewhere: Secondary | ICD-10-CM | POA: Diagnosis not present

## 2024-01-17 DIAGNOSIS — N1832 Chronic kidney disease, stage 3b: Secondary | ICD-10-CM | POA: Diagnosis not present

## 2024-01-17 DIAGNOSIS — R809 Proteinuria, unspecified: Secondary | ICD-10-CM | POA: Diagnosis not present

## 2024-01-17 DIAGNOSIS — I129 Hypertensive chronic kidney disease with stage 1 through stage 4 chronic kidney disease, or unspecified chronic kidney disease: Secondary | ICD-10-CM | POA: Diagnosis not present

## 2024-01-19 DIAGNOSIS — N183 Chronic kidney disease, stage 3 unspecified: Secondary | ICD-10-CM | POA: Diagnosis not present

## 2024-01-19 DIAGNOSIS — R52 Pain, unspecified: Secondary | ICD-10-CM | POA: Diagnosis not present

## 2024-01-19 DIAGNOSIS — R197 Diarrhea, unspecified: Secondary | ICD-10-CM | POA: Diagnosis not present

## 2024-01-19 DIAGNOSIS — I10 Essential (primary) hypertension: Secondary | ICD-10-CM | POA: Diagnosis not present

## 2024-01-19 DIAGNOSIS — Z299 Encounter for prophylactic measures, unspecified: Secondary | ICD-10-CM | POA: Diagnosis not present

## 2024-01-22 ENCOUNTER — Telehealth (INDEPENDENT_AMBULATORY_CARE_PROVIDER_SITE_OTHER): Payer: Self-pay | Admitting: *Deleted

## 2024-01-22 ENCOUNTER — Encounter (INDEPENDENT_AMBULATORY_CARE_PROVIDER_SITE_OTHER): Payer: Self-pay | Admitting: Gastroenterology

## 2024-01-22 ENCOUNTER — Ambulatory Visit (INDEPENDENT_AMBULATORY_CARE_PROVIDER_SITE_OTHER): Admitting: Gastroenterology

## 2024-01-22 VITALS — BP 105/59 | HR 82 | Temp 98.2°F | Ht 62.0 in | Wt 148.1 lb

## 2024-01-22 DIAGNOSIS — R630 Anorexia: Secondary | ICD-10-CM | POA: Diagnosis not present

## 2024-01-22 DIAGNOSIS — R110 Nausea: Secondary | ICD-10-CM | POA: Diagnosis not present

## 2024-01-22 DIAGNOSIS — K5909 Other constipation: Secondary | ICD-10-CM

## 2024-01-22 DIAGNOSIS — K5904 Chronic idiopathic constipation: Secondary | ICD-10-CM | POA: Insufficient documentation

## 2024-01-22 MED ORDER — PSYLLIUM 58.6 % PO PACK: 1.0000 | PACK | Freq: Two times a day (BID) | ORAL | 2 refills | Status: AC

## 2024-01-22 MED ORDER — PANTOPRAZOLE SODIUM 40 MG PO TBEC: 40.0000 mg | DELAYED_RELEASE_TABLET | Freq: Every day | ORAL | 3 refills | Status: AC

## 2024-01-22 MED ORDER — POLYETHYLENE GLYCOL 3350 17 G PO PACK: 17.0000 g | PACK | Freq: Two times a day (BID) | ORAL | 0 refills | Status: AC

## 2024-01-22 NOTE — Telephone Encounter (Signed)
 [employer] PA:  CPT Code 25822 Description: CT ABDOMEN & PELVIS W/ Case Number: [id number] Review Date: 01/22/2024 4:36:28 PM Expiration Date: N/A Status: This member's benefit plan did not require a prior authorization for this request.

## 2024-01-22 NOTE — Progress Notes (Signed)
 Skarleth Delmonico Faizan Kassady Laboy , M.D. Gastroenterology & Hepatology [facility] Gastroenterology [address] [place], [place] [address] Primary Care Physician: [name], MD [address] [place] [address]  Chief Complaint:  Nausea, constipation   History of Present Illness: Christine Burns is a 78 y.o. female with hypertension , hypothyroidism , fibromyalgia on methadone  who presents for evaluation of new onset nausea, loss of appetite and severe chronic constipation  Patient reports that she is on methadone  for  fibromyalgia.  Reports having 1 bowel movement every week which is solid hard in consistency.   Patient has new onset nausea this fluid persists most of the day and is not related to any food intake.  Patient reports recently her appetite has not been what it used to be now with decreased oral intake. The patient denies having any nausea, vomiting, fever, chills, hematochezia, melena, hematemesis, abdominal distention, abdominal pain, diarrhea, jaundice, pruritus or weight loss.  Most recent labs from 01/06/2024 creatinine 1.58 normal liver enzymes hemoglobin 11.9 MCV 92  Last ZHI:wnwz Last Colonoscopy:2000's  FHx: neg for any gastrointestinal/liver disease, no malignancies Social: neg smoking, alcohol  or illicit drug use Surgical: Hysterectomy Past Medical History: Past Medical History:  Diagnosis Date   Anemia    Anxiety    Arthritis    back & knees & fingers    Chronic kidney disease    due to increased use of NSAIDS in the past, appt. with [facility] x1, but now is managed by PCP- Nyland     Depression    Fibromyalgia    tremors   Hypertension    Hypothyroidism     Past Surgical History: Past Surgical History:  Procedure Laterality Date   ABDOMINAL HYSTERECTOMY     APPENDECTOMY     BACK SURGERY     cerv. fusion, low back surgery for stenosis    BREAST REDUCTION SURGERY Bilateral 1990's   KNEE ARTHROPLASTY Left 04/07/2021   Procedure: COMPUTER  ASSISTED TOTAL KNEE ARTHROPLASTY;  Surgeon: Fidel Rogue, MD;  Location: [facility];  Service: Orthopedics;  Laterality: Left;    Family History:History reviewed. No pertinent family history.  Social History: Social History   Tobacco Use  Smoking Status Never  Smokeless Tobacco Never   Social History   Substance and Sexual Activity  Alcohol  Use No   Social History   Substance and Sexual Activity  Drug Use No    Allergies: Allergies  Allergen Reactions   Fentanyl  Hypertension   Ibuprofen Other (See Comments)    Kidney damage   Zolpidem  Nausea And Vomiting and Other (See Comments)    Other reaction(s): Confusion (intolerance) made me go crazy made me go crazy made me go crazy made me go crazy Other Reaction: Other reaction    Cyclobenzaprine  Other (See Comments)    Other reaction(s): Confusion (intolerance), Hallucinations, Other (See Comments) States goes crazy Cognitive changes. States goes crazy States goes crazy States goes crazy Cognitive changes.    Diazepam  Other (See Comments)      SLEEP WALKING, hallucinations   Morphine And Codeine Rash    Other reaction(s): Other (see comments) Headache Headache Other Reaction: Other reaction    Zolpidem  Tartrate Nausea And Vomiting   Metaxalone    Meperidine Nausea Only   Morphine Rash    Medications: Current Outpatient Medications  Medication Sig Dispense Refill   amLODipine  (NORVASC ) 2.5 MG tablet Take 2.5 mg by mouth daily. (Patient taking differently: Take 5 mg by mouth as needed.)  Cyanocobalamin  (B-12 PO) Take 1 tablet by mouth daily.     Ferrous Sulfate (IRON PO) Take 1 tablet by mouth daily.     fluticasone  (FLONASE ) 50 MCG/ACT nasal spray Place 1-2 sprays into both nostrils daily as needed for allergies.     levothyroxine  (SYNTHROID , LEVOTHROID) 100 MCG tablet Take 100 mcg by mouth daily.     loratadine  (CLARITIN ) 10 MG tablet Take 10 mg by mouth daily as needed for allergies.       methadone  (DOLOPHINE ) 10 MG tablet Take 20 mg by mouth 2 (two) times daily.     MOVANTIK 12.5 MG TABS tablet Take 12.5 mg by mouth daily.     ondansetron  (ZOFRAN ) 4 MG tablet Take 1 tablet (4 mg total) by mouth every 6 (six) hours as needed for nausea. 20 tablet 0   pantoprazole (PROTONIX) 40 MG tablet Take 1 tablet (40 mg total) by mouth daily. 60 tablet 3   polyethylene glycol (MIRALAX  / GLYCOLAX ) 17 g packet Take 17 g by mouth 2 (two) times daily. 180 packet 0   pregabalin  (LYRICA ) 100 MG capsule Take 100 mg by mouth daily.     propranolol  ER (INDERAL  LA) 120 MG 24 hr capsule Take 120 mg by mouth daily.     psyllium (METAMUCIL) 58.6 % packet Take 1 packet by mouth 2 (two) times daily. 60 packet 2   traZODone  (DESYREL ) 100 MG tablet Take 100 mg by mouth at bedtime.     No current facility-administered medications for this visit.    Review of Systems: GENERAL: negative for malaise, night sweats HEENT: No changes in hearing or vision, no nose bleeds or other nasal problems. NECK: Negative for lumps, goiter, pain and significant neck swelling RESPIRATORY: Negative for cough, wheezing CARDIOVASCULAR: Negative for chest pain, leg swelling, palpitations, orthopnea GI: SEE HPI MUSCULOSKELETAL: Negative for joint pain or swelling, back pain, and muscle pain. SKIN: Negative for lesions, rash HEMATOLOGY Negative for prolonged bleeding, bruising easily, and swollen nodes. ENDOCRINE: Negative for cold or heat intolerance, polyuria, polydipsia and goiter. NEURO: negative for tremor, gait imbalance, syncope and seizures. The remainder of the review of systems is noncontributory.   Physical Exam: BP (!) 105/59   Pulse 82   Temp 98.2 F (36.8 C)   Ht 5' 2 (1.575 m)   Wt 148 lb 1.6 oz (67.2 kg)   BMI 27.09 kg/m  GENERAL: The patient is AO x3, in no acute distress. HEENT: Head is normocephalic and atraumatic. EOMI are intact. Mouth is well hydrated and without lesions. NECK: Supple. No  masses LUNGS: Clear to auscultation. No presence of rhonchi/wheezing/rales. Adequate chest expansion HEART: RRR, normal s1 and s2. ABDOMEN: Soft, nontender, no guarding, no peritoneal signs, and nondistended. BS +. No masses.  Imaging/Labs: as above     Latest Ref Rng & Units 01/06/2024   10:28 AM 04/10/2021    3:05 AM 04/09/2021    3:05 AM  CBC  WBC 4.0 - 10.5 K/uL 6.1  11.9  12.7   Hemoglobin 12.0 - 15.0 g/dL 88.0  89.4  89.0   Hematocrit 36.0 - 46.0 % 36.7  32.2  32.7   Platelets 150 - 400 K/uL 202  206  176    No results found for: IRON, TIBC, FERRITIN  I personally reviewed and interpreted the available labs, imaging and endoscopic files.  CXR:   IMPRESSION: Indeterminate midline retrocardiac, lower mediastinal increased density compared to remote 2009 chest radiographs. Unclear on this portable exam whether opacity is  related to the lungs or spine. And PA and lateral views of the chest would be helpful when feasible.   Underlying multilevel previous spinal fusion.  Ultrasound abdomen 12/2023   IMPRESSION:  1.   Echogenic liver likely represents fatty infiltration.  2.    Otherwise, normal abdominal ultrasound as described.    Impression and Plan: Christine Burns is a 78 y.o. female with hypertension , hypothyroidism , fibromyalgia on methadone  who presents for evaluation of new onset nausea, loss of appetite and severe chronic constipation  #Nausea #Loss of Appetite  Patient has new upper GI symptoms with loss of appetite consider alarm symptom for which upper endoscopy is indicated  Ultrasound negative but it is a suboptimal study to evaluate for any pancreatic lesion  Given persistent nausea not related with any food intake loss of appetite and elderly patient will also obtain cross-sectional imaging to assess for any pancreatic lesion  Patient nausea could be possibly contributing due to chronic severe constipation  Protonix daily , 30 min before  breakfast   # Chronic severe constipation Patient has 1 hard stool every week  This could be chronic idiopathic constipation or drug-induced constipation as patient currently is on methadone   Will obtain abdominal x-ray to evaluate for stool burden, if large amount is found we will give bowel purge  Ensure adequate fluid intake: Aim for 8 glasses of water  daily. Follow a high fiber diet: Include foods such as dates, prunes, pears, and kiwi. Take Miralax  twice a day for the first week, then reduce to once daily thereafter. Use Metamucil twice a day. Will give Linzess samples and uptitrate as tolerated  If above does not help then may add Movantik ( Naloxegol) in future  All questions were answered.      Christine Adkison Faizan Shareeka Yim, MD Gastroenterology and Hepatology [facility]   This chart has been completed using [facility] Dictation software, and while attempts have been made to ensure accuracy , certain words and phrases may not be transcribed as intended ;

## 2024-01-22 NOTE — Patient Instructions (Signed)
 It was very nice to meet you today, as dicussed with will plan for the following :  1) Ensure adequate fluid intake: Aim for 8 glasses of water  daily. Follow a high fiber diet: Include foods such as dates, prunes, pears, and kiwi. Take Miralax  twice a day for the first week, then reduce to once daily thereafter. Use Metamucil twice a day.   2)Linzess works best when taken once a day every day, on an empty stomach, at least 30 minutes before your first meal of the day.  Diarrhea, nausea or abdominal cramping may occur in the first 2 weeks -keep taking it.  These symptoms should eventually resolve. Please notify us  if having more than 4 watery bowel movements per day or fecal soiling accidents.   3) Upper endoscopy   4) CT Abdomen and pelvis  5) abdominal xray

## 2024-01-23 ENCOUNTER — Encounter (INDEPENDENT_AMBULATORY_CARE_PROVIDER_SITE_OTHER): Payer: Self-pay | Admitting: *Deleted

## 2024-01-23 NOTE — Telephone Encounter (Signed)
 Letter mailed with CT appt date, time and location.

## 2024-01-25 DIAGNOSIS — H2513 Age-related nuclear cataract, bilateral: Secondary | ICD-10-CM | POA: Diagnosis not present

## 2024-01-25 DIAGNOSIS — H35033 Hypertensive retinopathy, bilateral: Secondary | ICD-10-CM | POA: Diagnosis not present

## 2024-01-25 DIAGNOSIS — H348322 Tributary (branch) retinal vein occlusion, left eye, stable: Secondary | ICD-10-CM | POA: Diagnosis not present

## 2024-01-25 DIAGNOSIS — H21562 Pupillary abnormality, left eye: Secondary | ICD-10-CM | POA: Diagnosis not present

## 2024-01-29 ENCOUNTER — Ambulatory Visit (HOSPITAL_COMMUNITY)
Admission: RE | Admit: 2024-01-29 | Discharge: 2024-01-29 | Disposition: A | Discharge status: Home / Self Care | Source: Ambulatory Visit | Attending: Gastroenterology | Admitting: Gastroenterology

## 2024-01-29 DIAGNOSIS — K5904 Chronic idiopathic constipation: Secondary | ICD-10-CM | POA: Insufficient documentation

## 2024-01-29 DIAGNOSIS — K5909 Other constipation: Secondary | ICD-10-CM | POA: Diagnosis not present

## 2024-01-31 ENCOUNTER — Telehealth: Payer: Self-pay | Admitting: *Deleted

## 2024-01-31 NOTE — Telephone Encounter (Signed)
 Spoke with pt. Scheduled EGD with Dr. Cinderella 8/4. Aware will send instructions to her. Aware top hold iron x 7 days prior.

## 2024-02-05 DIAGNOSIS — G894 Chronic pain syndrome: Secondary | ICD-10-CM | POA: Diagnosis not present

## 2024-02-05 DIAGNOSIS — Z79891 Long term (current) use of opiate analgesic: Secondary | ICD-10-CM | POA: Diagnosis not present

## 2024-02-05 DIAGNOSIS — Z96652 Presence of left artificial knee joint: Secondary | ICD-10-CM | POA: Diagnosis not present

## 2024-02-05 DIAGNOSIS — M4326 Fusion of spine, lumbar region: Secondary | ICD-10-CM | POA: Diagnosis not present

## 2024-02-05 DIAGNOSIS — M797 Fibromyalgia: Secondary | ICD-10-CM | POA: Diagnosis not present

## 2024-02-06 DIAGNOSIS — H35033 Hypertensive retinopathy, bilateral: Secondary | ICD-10-CM | POA: Diagnosis not present

## 2024-02-06 DIAGNOSIS — H34832 Tributary (branch) retinal vein occlusion, left eye, with macular edema: Secondary | ICD-10-CM | POA: Diagnosis not present

## 2024-02-06 DIAGNOSIS — H35372 Puckering of macula, left eye: Secondary | ICD-10-CM | POA: Diagnosis not present

## 2024-02-06 DIAGNOSIS — H2513 Age-related nuclear cataract, bilateral: Secondary | ICD-10-CM | POA: Diagnosis not present

## 2024-02-08 NOTE — Telephone Encounter (Signed)
 Pt is cancelling her procedure on 02/26/24 due to transportation issues.She asked if she could just be dropped off and then someone come back to pick her up. Advised pt that will need to have someone with her at check in so they can verify that she does have a responsible adult with her.  She states she will call back to reschedule when she can find someone to go with her.  Message sent to endo.

## 2024-02-09 DIAGNOSIS — R52 Pain, unspecified: Secondary | ICD-10-CM | POA: Diagnosis not present

## 2024-02-09 DIAGNOSIS — N183 Chronic kidney disease, stage 3 unspecified: Secondary | ICD-10-CM | POA: Diagnosis not present

## 2024-02-09 DIAGNOSIS — I10 Essential (primary) hypertension: Secondary | ICD-10-CM | POA: Diagnosis not present

## 2024-02-09 DIAGNOSIS — Z0000 Encounter for general adult medical examination without abnormal findings: Secondary | ICD-10-CM | POA: Diagnosis not present

## 2024-02-09 DIAGNOSIS — Z299 Encounter for prophylactic measures, unspecified: Secondary | ICD-10-CM | POA: Diagnosis not present

## 2024-02-12 NOTE — Telephone Encounter (Signed)
 Pt called in and also wants to cancel her upcoming CT appointment. She reports she will reschedule at at later date.

## 2024-02-21 ENCOUNTER — Ambulatory Visit (HOSPITAL_COMMUNITY)

## 2024-02-26 ENCOUNTER — Ambulatory Visit (HOSPITAL_COMMUNITY): Admit: 2024-02-26 | Admitting: Gastroenterology

## 2024-02-26 ENCOUNTER — Encounter (HOSPITAL_COMMUNITY): Payer: Self-pay

## 2024-02-26 SURGERY — EGD (ESOPHAGOGASTRODUODENOSCOPY)
Anesthesia: Choice

## 2024-03-04 ENCOUNTER — Encounter: Payer: Self-pay | Admitting: Internal Medicine

## 2024-03-04 ENCOUNTER — Ambulatory Visit: Attending: Internal Medicine | Admitting: Internal Medicine

## 2024-03-04 VITALS — BP 176/76 | HR 60 | Ht 62.0 in | Wt 145.0 lb

## 2024-03-04 DIAGNOSIS — M5412 Radiculopathy, cervical region: Secondary | ICD-10-CM | POA: Diagnosis not present

## 2024-03-04 DIAGNOSIS — R002 Palpitations: Secondary | ICD-10-CM | POA: Insufficient documentation

## 2024-03-04 DIAGNOSIS — I10 Essential (primary) hypertension: Secondary | ICD-10-CM | POA: Insufficient documentation

## 2024-03-04 NOTE — Patient Instructions (Addendum)

## 2024-03-04 NOTE — Progress Notes (Signed)
 Cardiology Office Note  Date: 03/04/2024   ID: Christine Burns, DOB 1945/12/19, MRN [id number]  PCP:  [name], MD  Cardiologist:  Diannah SHAUNNA Maywood, MD Electrophysiologist:  None   History of Present Illness: Christine Burns is a 78 y.o. female  Referred to cardiology clinic for evaluation of left arm pain and palpitations.  Patient has pain in her left neck and left arm for the last 4 to 5 months.  No chest pain with exertion.  No chest pain ever.  Denied having any angina.  No DOE.  She was told she has pinched nerve in her neck.  She underwent multiple neck and back surgeries.  No leg swelling.  Past Medical History:  Diagnosis Date   Anemia    Anxiety    Arthritis    back & knees & fingers    Chronic kidney disease    due to increased use of NSAIDS in the past, appt. with [facility] x1, but now is managed by PCP- Nyland     Depression    Fibromyalgia    tremors   Hypertension    Hypothyroidism     Past Surgical History:  Procedure Laterality Date   ABDOMINAL HYSTERECTOMY     APPENDECTOMY     BACK SURGERY     cerv. fusion, low back surgery for stenosis    BREAST REDUCTION SURGERY Bilateral 1990's   KNEE ARTHROPLASTY Left 04/07/2021   Procedure: COMPUTER ASSISTED TOTAL KNEE ARTHROPLASTY;  Surgeon: Fidel Rogue, MD;  Location: [facility];  Service: Orthopedics;  Laterality: Left;    Current Outpatient Medications  Medication Sig Dispense Refill   amLODipine  (NORVASC ) 2.5 MG tablet Take 2.5 mg by mouth daily.     Cyanocobalamin  (B-12 PO) Take 1 tablet by mouth daily.     Ferrous Sulfate (IRON PO) Take 1 tablet by mouth daily.     fluticasone  (FLONASE ) 50 MCG/ACT nasal spray Place 1-2 sprays into both nostrils daily as needed for allergies.     levothyroxine  (SYNTHROID , LEVOTHROID) 100 MCG tablet Take 100 mcg by mouth daily.     loratadine  (CLARITIN ) 10 MG tablet Take 10 mg by mouth daily as needed for allergies.      methadone  (DOLOPHINE ) 10 MG tablet Take 20 mg by mouth  2 (two) times daily.     MOVANTIK 12.5 MG TABS tablet Take 12.5 mg by mouth daily.     ondansetron  (ZOFRAN ) 4 MG tablet Take 1 tablet (4 mg total) by mouth every 6 (six) hours as needed for nausea. 20 tablet 0   pantoprazole  (PROTONIX ) 40 MG tablet Take 1 tablet (40 mg total) by mouth daily. 60 tablet 3   polyethylene glycol (MIRALAX  / GLYCOLAX ) 17 g packet Take 17 g by mouth 2 (two) times daily. 180 packet 0   pregabalin  (LYRICA ) 100 MG capsule Take 100 mg by mouth daily.     propranolol  ER (INDERAL  LA) 120 MG 24 hr capsule Take 120 mg by mouth daily.     psyllium (METAMUCIL) 58.6 % packet Take 1 packet by mouth 2 (two) times daily. 60 packet 2   traZODone  (DESYREL ) 100 MG tablet Take 100 mg by mouth at bedtime.     No current facility-administered medications for this visit.   Allergies:  Fentanyl , Ibuprofen, Zolpidem , Cyclobenzaprine , Diazepam , Morphine and codeine, Zolpidem  tartrate, Metaxalone, Meperidine, and Morphine   Social History: The patient  reports that she has never smoked. She has never used smokeless tobacco. She reports that she  does not drink alcohol  and does not use drugs.   Family History: The patient's family history is not on file.   ROS:  Please see the history of present illness. Otherwise, complete review of systems is positive for none  All other systems are reviewed and negative.   Physical Exam: VS:  BP (!) 176/76 (BP Location: Right Arm, Cuff Size: Normal)   Pulse 60   Ht 5' 2 (1.575 m)   Wt 145 lb (65.8 kg)   SpO2 95%   BMI 26.52 kg/m , BMI Body mass index is 26.52 kg/m.  Wt Readings from Last 3 Encounters:  03/04/24 145 lb (65.8 kg)  01/22/24 148 lb 1.6 oz (67.2 kg)  01/06/24 150 lb (68 kg)    General: Patient appears comfortable at rest. HEENT: Conjunctiva and lids normal, oropharynx clear with moist mucosa. Neck: Supple, no elevated JVP or carotid bruits, no thyromegaly. Lungs: Clear to auscultation, nonlabored breathing at rest. Cardiac:  Regular rate and rhythm, no S3 or significant systolic murmur, no pericardial rub. Abdomen: Soft, nontender, no hepatomegaly, bowel sounds present, no guarding or rebound. Extremities: No pitting edema, distal pulses 2+. Skin: Warm and dry. Musculoskeletal: No kyphosis. Neuropsychiatric: Alert and oriented x3, affect grossly appropriate.  Recent Labwork: 01/06/2024: ALT 21; AST 24; BUN 17; Creatinine, Ser 1.58; Hemoglobin 11.9; Platelets 202; Potassium 3.9; Sodium 138  No results found for: CHOL, TRIG, HDL, CHOLHDL, VLDL, LDLCALC, LDLDIRECT  Other Studies Reviewed Today:   Assessment and Plan:  Left neck pain and left arm pain Left cervical radiculopathy - Does not have any chest pain with exertion or with left neck pain/left arm pain. - Noncardiac. - No further cardiac workup is indicated at this time. - Discussed symptoms of CAD and MI with the patient. - Discussed ER precautions for chest pain.  HTN, controlled - Has an element of whitecoat HTN today. - Patient reported that her blood pressure is elevated today likely secondary to pain in her left arm. - Home blood pressures range around 120 to 130 mmHg. - Continue amlodipine  2.5 mg once daily and propranolol  120 mg once daily. - Check blood pressures in a.m. and p.m.  Follow with PCP.  Palpitations - Patient had palpitations for 2 days when she took Adderall.  Once she is taken off of it, she did not have any more palpitations. - Continue propranolol  120 mg once daily. - No indication for event monitor at this time.    Medication Adjustments/Labs and Tests Ordered: Current medicines are reviewed at length with the patient today.  Concerns regarding medicines are outlined above.    Disposition:  Follow up as needed  Signed Nicholai Willette Arleta Maywood, MD, 03/04/2024 3:48 PM    [facility] [address] [place], [place], [place] [address]

## 2024-03-07 DIAGNOSIS — H3581 Retinal edema: Secondary | ICD-10-CM | POA: Diagnosis not present

## 2024-03-07 DIAGNOSIS — H01004 Unspecified blepharitis left upper eyelid: Secondary | ICD-10-CM | POA: Diagnosis not present

## 2024-03-07 DIAGNOSIS — H01002 Unspecified blepharitis right lower eyelid: Secondary | ICD-10-CM | POA: Diagnosis not present

## 2024-03-07 DIAGNOSIS — H35352 Cystoid macular degeneration, left eye: Secondary | ICD-10-CM | POA: Diagnosis not present

## 2024-03-07 DIAGNOSIS — H01001 Unspecified blepharitis right upper eyelid: Secondary | ICD-10-CM | POA: Diagnosis not present

## 2024-03-07 DIAGNOSIS — H2513 Age-related nuclear cataract, bilateral: Secondary | ICD-10-CM | POA: Diagnosis not present

## 2024-03-12 ENCOUNTER — Ambulatory Visit (INDEPENDENT_AMBULATORY_CARE_PROVIDER_SITE_OTHER): Admitting: Gastroenterology

## 2024-03-13 DIAGNOSIS — Z299 Encounter for prophylactic measures, unspecified: Secondary | ICD-10-CM | POA: Diagnosis not present

## 2024-03-13 DIAGNOSIS — M542 Cervicalgia: Secondary | ICD-10-CM | POA: Diagnosis not present

## 2024-03-13 DIAGNOSIS — I10 Essential (primary) hypertension: Secondary | ICD-10-CM | POA: Diagnosis not present

## 2024-03-13 DIAGNOSIS — R52 Pain, unspecified: Secondary | ICD-10-CM | POA: Diagnosis not present

## 2024-03-13 DIAGNOSIS — M797 Fibromyalgia: Secondary | ICD-10-CM | POA: Diagnosis not present

## 2024-03-18 DIAGNOSIS — Z79891 Long term (current) use of opiate analgesic: Secondary | ICD-10-CM | POA: Diagnosis not present

## 2024-03-18 DIAGNOSIS — M4322 Fusion of spine, cervical region: Secondary | ICD-10-CM | POA: Diagnosis not present

## 2024-03-18 DIAGNOSIS — M797 Fibromyalgia: Secondary | ICD-10-CM | POA: Diagnosis not present

## 2024-03-18 DIAGNOSIS — G894 Chronic pain syndrome: Secondary | ICD-10-CM | POA: Diagnosis not present

## 2024-03-18 DIAGNOSIS — M4326 Fusion of spine, lumbar region: Secondary | ICD-10-CM | POA: Diagnosis not present

## 2024-03-18 DIAGNOSIS — Z96652 Presence of left artificial knee joint: Secondary | ICD-10-CM | POA: Diagnosis not present

## 2024-03-19 DIAGNOSIS — H35372 Puckering of macula, left eye: Secondary | ICD-10-CM | POA: Diagnosis not present

## 2024-03-19 DIAGNOSIS — H34832 Tributary (branch) retinal vein occlusion, left eye, with macular edema: Secondary | ICD-10-CM | POA: Diagnosis not present

## 2024-03-19 DIAGNOSIS — H2513 Age-related nuclear cataract, bilateral: Secondary | ICD-10-CM | POA: Diagnosis not present

## 2024-03-19 DIAGNOSIS — H35033 Hypertensive retinopathy, bilateral: Secondary | ICD-10-CM | POA: Diagnosis not present

## 2024-03-20 ENCOUNTER — Encounter (INDEPENDENT_AMBULATORY_CARE_PROVIDER_SITE_OTHER): Payer: Self-pay | Admitting: Gastroenterology

## 2024-03-20 ENCOUNTER — Telehealth: Payer: Self-pay | Admitting: *Deleted

## 2024-03-20 ENCOUNTER — Ambulatory Visit (INDEPENDENT_AMBULATORY_CARE_PROVIDER_SITE_OTHER): Admitting: Gastroenterology

## 2024-03-20 ENCOUNTER — Other Ambulatory Visit (INDEPENDENT_AMBULATORY_CARE_PROVIDER_SITE_OTHER): Payer: Self-pay

## 2024-03-20 VITALS — BP 123/80 | HR 73 | Temp 98.2°F | Ht 62.0 in | Wt 143.4 lb

## 2024-03-20 DIAGNOSIS — K5909 Other constipation: Secondary | ICD-10-CM

## 2024-03-20 DIAGNOSIS — R110 Nausea: Secondary | ICD-10-CM | POA: Diagnosis not present

## 2024-03-20 DIAGNOSIS — K5904 Chronic idiopathic constipation: Secondary | ICD-10-CM

## 2024-03-20 DIAGNOSIS — R630 Anorexia: Secondary | ICD-10-CM | POA: Diagnosis not present

## 2024-03-20 NOTE — Progress Notes (Signed)
 Christine Burns Christine Burns , M.D. Gastroenterology & Hepatology [facility] [address] [place], [place] [address] Primary Care Physician: Christine Isles, MD [address] [place] [address]  Chief Complaint:  Nausea, constipation   History of Present Illness: Christine Burns is a 78 y.o. female with hypertension , hypothyroidism , fibromyalgia on methadone  who presents for evaluation of nausea, loss of appetite and severe chronic constipation  Patient was seen last 2 months ago where given her symptoms I have recommended upper endoscopy and cross-sectional imaging with CT which patient had canceled; patient reports she did not have an escort for upper endoscopy procedure. patient reports her symptoms resolved after starting Linzess 72 mcg.  She is having regular bowel movements daily.  She continues to take MiraLAX  and Metamucil as well. Patient reports that she is on methadone  for  fibromyalgia.  Patient nausea has also improved Previously  reported nausea this would  persists most of the day and is not related to any food intake.  Patient reports recently her appetite has not been what it used to be now with decreased oral intake. The patient denies having any nausea, vomiting, fever, chills, hematochezia, melena, hematemesis, abdominal distention, abdominal pain, diarrhea, jaundice, pruritus or weight loss.  Most recent labs from 01/06/2024 creatinine 1.58 normal liver enzymes hemoglobin 11.9 MCV 92  Last ZHI:wnwz Last Colonoscopy:2000's  FHx: neg for any gastrointestinal/liver disease, no malignancies Social: neg smoking, alcohol  or illicit drug use Surgical: Hysterectomy Past Medical History: Past Medical History:  Diagnosis Date   Anemia    Anxiety    Arthritis    back & knees & fingers    Chronic kidney disease    due to increased use of NSAIDS in the past, appt. with [facility] x1, but now is managed by PCP- Nyland     Depression    Fibromyalgia     tremors   Hypertension    Hypothyroidism     Past Surgical History: Past Surgical History:  Procedure Laterality Date   ABDOMINAL HYSTERECTOMY     APPENDECTOMY     BACK SURGERY     cerv. fusion, low back surgery for stenosis    BREAST REDUCTION SURGERY Bilateral 1990's   KNEE ARTHROPLASTY Left 04/07/2021   Procedure: COMPUTER ASSISTED TOTAL KNEE ARTHROPLASTY;  Surgeon: Fidel Rogue, MD;  Location: [facility];  Service: Orthopedics;  Laterality: Left;    Family History:History reviewed. No pertinent family history.  Social History: Social History   Tobacco Use  Smoking Status Never  Smokeless Tobacco Never   Social History   Substance and Sexual Activity  Alcohol  Use No   Social History   Substance and Sexual Activity  Drug Use No    Allergies: Allergies  Allergen Reactions   Fentanyl  Hypertension   Ibuprofen Other (See Comments)    Kidney damage   Zolpidem  Nausea And Vomiting and Other (See Comments)    Other reaction(s): Confusion (intolerance) made me go crazy made me go crazy made me go crazy made me go crazy Other Reaction: Other reaction    Cyclobenzaprine  Other (See Comments)    Other reaction(s): Confusion (intolerance), Hallucinations, Other (See Comments) States goes crazy Cognitive changes. States goes crazy States goes crazy States goes crazy Cognitive changes.    Diazepam  Other (See Comments)      SLEEP WALKING, hallucinations   Morphine And Codeine Rash    Other reaction(s): Other (see comments) Headache Headache Other Reaction: Other reaction    Zolpidem  Tartrate  Nausea And Vomiting   Metaxalone    Meperidine Nausea Only   Morphine Rash    Medications: Current Outpatient Medications  Medication Sig Dispense Refill   amLODipine  (NORVASC ) 2.5 MG tablet Take 2.5 mg by mouth daily.     Cyanocobalamin  (B-12 PO) Take 1 tablet by mouth daily.     Ferrous Sulfate (IRON PO) Take 1 tablet by mouth daily.     fluticasone   (FLONASE ) 50 MCG/ACT nasal spray Place 1-2 sprays into both nostrils daily as needed for allergies.     levothyroxine  (SYNTHROID , LEVOTHROID) 100 MCG tablet Take 100 mcg by mouth daily.     loratadine  (CLARITIN ) 10 MG tablet Take 10 mg by mouth daily as needed for allergies.      methadone  (DOLOPHINE ) 10 MG tablet Take 20 mg by mouth 2 (two) times daily.     MOVANTIK 12.5 MG TABS tablet Take 12.5 mg by mouth daily.     ondansetron  (ZOFRAN ) 4 MG tablet Take 1 tablet (4 mg total) by mouth every 6 (six) hours as needed for nausea. 20 tablet 0   pantoprazole  (PROTONIX ) 40 MG tablet Take 1 tablet (40 mg total) by mouth daily. 60 tablet 3   polyethylene glycol (MIRALAX  / GLYCOLAX ) 17 g packet Take 17 g by mouth 2 (two) times daily. 180 packet 0   pregabalin  (LYRICA ) 100 MG capsule Take 100 mg by mouth daily.     propranolol  ER (INDERAL  LA) 120 MG 24 hr capsule Take 120 mg by mouth daily.     psyllium (METAMUCIL) 58.6 % packet Take 1 packet by mouth 2 (two) times daily. 60 packet 2   traZODone  (DESYREL ) 100 MG tablet Take 100 mg by mouth at bedtime.     No current facility-administered medications for this visit.    Review of Systems: GENERAL: negative for malaise, night sweats HEENT: No changes in hearing or vision, no nose bleeds or other nasal problems. NECK: Negative for lumps, goiter, pain and significant neck swelling RESPIRATORY: Negative for cough, wheezing CARDIOVASCULAR: Negative for chest pain, leg swelling, palpitations, orthopnea GI: SEE HPI MUSCULOSKELETAL: Negative for joint pain or swelling, back pain, and muscle pain. SKIN: Negative for lesions, rash HEMATOLOGY Negative for prolonged bleeding, bruising easily, and swollen nodes. ENDOCRINE: Negative for cold or heat intolerance, polyuria, polydipsia and goiter. NEURO: negative for tremor, gait imbalance, syncope and seizures. The remainder of the review of systems is noncontributory.   Physical Exam: BP 123/80   Pulse 73    Temp 98.2 F (36.8 C)   Ht 5' 2 (1.575 m)   Wt 143 lb 6.4 oz (65 kg)   BMI 26.23 kg/m  GENERAL: The patient is AO x3, in no acute distress. HEENT: Head is normocephalic and atraumatic. EOMI are intact. Mouth is well hydrated and without lesions. NECK: Supple. No masses LUNGS: Clear to auscultation. No presence of rhonchi/wheezing/rales. Adequate chest expansion HEART: RRR, normal s1 and s2. ABDOMEN: Soft, nontender, no guarding, no peritoneal signs, and nondistended. BS +. No masses.  Imaging/Labs: as above     Latest Ref Rng & Units 01/06/2024   10:28 AM 04/10/2021    3:05 AM 04/09/2021    3:05 AM  CBC  WBC 4.0 - 10.5 K/uL 6.1  11.9  12.7   Hemoglobin 12.0 - 15.0 g/dL 88.0  89.4  89.0   Hematocrit 36.0 - 46.0 % 36.7  32.2  32.7   Platelets 150 - 400 K/uL 202  206  176    No results  found for: IRON, TIBC, FERRITIN  I personally reviewed and interpreted the available labs, imaging and endoscopic files.  CXR:   IMPRESSION: Indeterminate midline retrocardiac, lower mediastinal increased density compared to remote 2009 chest radiographs. Unclear on this portable exam whether opacity is related to the lungs or spine. And PA and lateral views of the chest would be helpful when feasible.   Underlying multilevel previous spinal fusion.  Ultrasound abdomen 12/2023   IMPRESSION:  1.   Echogenic liver likely represents fatty infiltration.  2.    Otherwise, normal abdominal ultrasound as described.    Impression and Plan: KJIRSTEN BLOODGOOD is a 78 y.o. female with hypertension , hypothyroidism , fibromyalgia on methadone  who presents for evaluation of new onset nausea, loss of appetite and severe chronic constipation  #Nausea #Loss of Appetite # Chronic severe constipation  All of patient's symptoms has significantly improved after having regular bowel movements since Linzess 72 mcg was added.  I recommend continuing taking Linzess along with Metamucil/MiraLAX   Patient  has new upper GI symptoms with loss of appetite consider alarm symptom for which upper endoscopy is indicated.  We will schedule upper endoscopy but patient later canceled it because not having any escort  Ultrasound negative but it is a suboptimal study to evaluate for any pancreatic lesion  Given persistent nausea not related with any food intake loss of appetite and elderly patient will also obtain cross-sectional imaging to assess for any pancreatic lesion.  Patient also had cancel CT scan  I also recommend patient to proceed with appropriate workup of new upper GI symptoms given advanced age with upper endoscopy and cross-sectional imaging  Protonix  daily , 30 min before breakfast   chronic idiopathic constipation or drug-induced constipation as patient currently is on methadone   Ensure adequate fluid intake: Aim for 8 glasses of water  daily. Follow a high fiber diet: Include foods such as dates, prunes, pears, and kiwi. Take Miralax  twice a day for the first week, then reduce to once daily thereafter. Use Metamucil twice a day.  If above does not help then may add Movantik ( Naloxegol) in future or uptitrate linzess  All questions were answered.      Kemonie Cutillo Christine Caley Volkert, MD Gastroenterology and Hepatology [facility]enterology   This chart has been completed using [employer] Dictation software, and while attempts have been made to ensure accuracy , certain words and phrases may not be transcribed as intended ;

## 2024-03-20 NOTE — Telephone Encounter (Signed)
 ATC to give appt, VM full. Need to give CT appt 9/9, arrival 11:45am

## 2024-03-20 NOTE — Patient Instructions (Signed)
 It was very nice to meet you today, as dicussed with will plan for the following :  1) CT scan  2) if you find someone to escort you for procedure let us  know about scheduling upper endoscopy at that time

## 2024-03-20 NOTE — Telephone Encounter (Signed)
 ATC pt to give appt for CT, VM was full.

## 2024-03-20 NOTE — Telephone Encounter (Signed)
Pt called back and is aware of appt details 

## 2024-03-21 DIAGNOSIS — M5412 Radiculopathy, cervical region: Secondary | ICD-10-CM | POA: Diagnosis not present

## 2024-03-21 DIAGNOSIS — M542 Cervicalgia: Secondary | ICD-10-CM | POA: Diagnosis not present

## 2024-03-29 DIAGNOSIS — M542 Cervicalgia: Secondary | ICD-10-CM | POA: Diagnosis not present

## 2024-03-29 DIAGNOSIS — Z299 Encounter for prophylactic measures, unspecified: Secondary | ICD-10-CM | POA: Diagnosis not present

## 2024-03-29 DIAGNOSIS — J020 Streptococcal pharyngitis: Secondary | ICD-10-CM | POA: Diagnosis not present

## 2024-03-29 DIAGNOSIS — I10 Essential (primary) hypertension: Secondary | ICD-10-CM | POA: Diagnosis not present

## 2024-04-02 ENCOUNTER — Ambulatory Visit (HOSPITAL_COMMUNITY): Admission: RE | Admit: 2024-04-02 | Source: Ambulatory Visit

## 2024-04-03 DIAGNOSIS — M47812 Spondylosis without myelopathy or radiculopathy, cervical region: Secondary | ICD-10-CM | POA: Diagnosis not present

## 2024-04-03 DIAGNOSIS — M4802 Spinal stenosis, cervical region: Secondary | ICD-10-CM | POA: Diagnosis not present

## 2024-04-03 DIAGNOSIS — Z981 Arthrodesis status: Secondary | ICD-10-CM | POA: Diagnosis not present

## 2024-04-03 DIAGNOSIS — M5031 Other cervical disc degeneration,  high cervical region: Secondary | ICD-10-CM | POA: Diagnosis not present

## 2024-04-03 DIAGNOSIS — M5011 Cervical disc disorder with radiculopathy,  high cervical region: Secondary | ICD-10-CM | POA: Diagnosis not present

## 2024-04-03 DIAGNOSIS — M5033 Other cervical disc degeneration, cervicothoracic region: Secondary | ICD-10-CM | POA: Diagnosis not present

## 2024-04-03 DIAGNOSIS — M4722 Other spondylosis with radiculopathy, cervical region: Secondary | ICD-10-CM | POA: Diagnosis not present

## 2024-04-15 DIAGNOSIS — M4326 Fusion of spine, lumbar region: Secondary | ICD-10-CM | POA: Diagnosis not present

## 2024-04-15 DIAGNOSIS — G894 Chronic pain syndrome: Secondary | ICD-10-CM | POA: Diagnosis not present

## 2024-04-15 DIAGNOSIS — Z96652 Presence of left artificial knee joint: Secondary | ICD-10-CM | POA: Diagnosis not present

## 2024-04-15 DIAGNOSIS — Z79891 Long term (current) use of opiate analgesic: Secondary | ICD-10-CM | POA: Diagnosis not present

## 2024-04-15 DIAGNOSIS — M4322 Fusion of spine, cervical region: Secondary | ICD-10-CM | POA: Diagnosis not present

## 2024-04-15 DIAGNOSIS — M797 Fibromyalgia: Secondary | ICD-10-CM | POA: Diagnosis not present

## 2024-04-16 DIAGNOSIS — H35033 Hypertensive retinopathy, bilateral: Secondary | ICD-10-CM | POA: Diagnosis not present

## 2024-04-16 DIAGNOSIS — H35372 Puckering of macula, left eye: Secondary | ICD-10-CM | POA: Diagnosis not present

## 2024-04-16 DIAGNOSIS — H2513 Age-related nuclear cataract, bilateral: Secondary | ICD-10-CM | POA: Diagnosis not present

## 2024-04-16 DIAGNOSIS — H34832 Tributary (branch) retinal vein occlusion, left eye, with macular edema: Secondary | ICD-10-CM | POA: Diagnosis not present

## 2024-04-18 DIAGNOSIS — M5412 Radiculopathy, cervical region: Secondary | ICD-10-CM | POA: Diagnosis not present

## 2024-04-18 DIAGNOSIS — M4322 Fusion of spine, cervical region: Secondary | ICD-10-CM | POA: Diagnosis not present

## 2024-04-18 DIAGNOSIS — M542 Cervicalgia: Secondary | ICD-10-CM | POA: Diagnosis not present

## 2024-04-24 DIAGNOSIS — M5412 Radiculopathy, cervical region: Secondary | ICD-10-CM | POA: Diagnosis not present

## 2024-04-29 DIAGNOSIS — H2512 Age-related nuclear cataract, left eye: Secondary | ICD-10-CM | POA: Diagnosis not present

## 2024-05-01 NOTE — H&P (Signed)
 Surgical History & Physical  Patient Name: Christine Burns  DOB: 09-Nov-1945  Surgery: Cataract extraction with intraocular lens implant phacoemulsification; Left Eye Surgeon: Lynwood Hermann MD Surgery Date: 05/06/2024 Pre-Op Date: 03/07/2024  HPI: A 41 Yr. old female patient 1.  The patient complains of difficulty when viewing TV, reading closed caption, news scrolls on TV. Both eyes are affected. The condition's severity is worsening. Symptoms occur when the patient is driving. The complaint is associated with blurry vision. This is negatively affecting the patient's quality of life and the patient is unable to function adequately in life with the current level of vision. The patient experiences no flashes, floater, shadow, curtain or veil. Gtts: none reported HPI Completed by Dr. Lynwood Hermann  Medical History: Cataracts  Arthritis High Blood Pressure Thyroid  Problems  Review of Systems Cardiovascular High Blood Pressure Endocrine Hypothyroidism Musculoskeletal Joint Ache All recorded systems are negative except as noted above.  Social Never smoked   Medication Prednisolone-moxiflox-bromfen,  Propranolol , Lyrica , Norvasc , Stool Softener, ADHD Medication, Levothyroxine , Methadone   Sx/Procedures Back Surgery, Hysterectomy  Drug Allergies  Meperidine, Metaxalone, Morphine, Diazepam , Cyclobenzaprine , Zolpidem , Ibuprofen, Fentanyl   History & Physical: Heent: cataracts NECK: supple without bruits LUNGS: lungs clear to auscultation CV: regular rate and rhythm Abdomen: soft and non-tender  Impression & Plan: Assessment: 1.  NUCLEAR SCLEROSIS AGE RELATED; Both Eyes (H25.13) 2.  BLEPHARITIS; Right Upper Lid, Right Lower Lid, Left Upper Lid, Left Lower Lid (H01.001, H01.002,H01.004,H01.005) 3.  Cystoid Macular Edema; Left Eye (H35.352, H35.81)  Plan: 1.  Cataract accounts for the patient's decreased vision. This visual impairment is not correctable with a tolerable change in  glasses or contact lenses. Cataract surgery with an implantation of a new lens should significantly improve the visual and functional status of the patient. Recommend phacoemulsification with intraocular lens. Discussed all risks, benefits, alternatives, and potential complications. Discussed the procedures and recovery. The patient desires to have surgery. A-scan/Biometry ordered and will be performed for intraocular lens calculations. The surgery will be performed in order to improve vision for driving, reading, and for eye examinations. Recommend Dextenza  for post-operative pain and inflammation. Educational materials provided: Cataract. History of corneal refractive Surgery: None History of ocular trauma: History of retinal injections. Pupil Status: Dilates well - shugarcaine or Lidocaine +Omidira by protocol Left Eye. First. Refractive Goal: [place]  2.  Blepharitis is present - recommend regular lid cleaning.  3.  Discussed pathology involved with CME and treatment options. Under the care of Dr. Elner. Please inform of surgery schedule for him to arrange for injections OS.

## 2024-05-02 ENCOUNTER — Encounter (HOSPITAL_COMMUNITY)
Admission: RE | Admit: 2024-05-02 | Discharge: 2024-05-02 | Disposition: A | Discharge status: Home / Self Care | Source: Ambulatory Visit | Attending: Ophthalmology | Admitting: Ophthalmology

## 2024-05-02 NOTE — Pre-Procedure Instructions (Signed)
 Attempted pre-op phone call to daughter, Arnett Ellen phone of [phone] since this was the number listed for patient. MB is full and I could not leave a message.

## 2024-05-03 DIAGNOSIS — D631 Anemia in chronic kidney disease: Secondary | ICD-10-CM | POA: Diagnosis not present

## 2024-05-03 DIAGNOSIS — E211 Secondary hyperparathyroidism, not elsewhere classified: Secondary | ICD-10-CM | POA: Diagnosis not present

## 2024-05-03 DIAGNOSIS — N189 Chronic kidney disease, unspecified: Secondary | ICD-10-CM | POA: Diagnosis not present

## 2024-05-03 DIAGNOSIS — R809 Proteinuria, unspecified: Secondary | ICD-10-CM | POA: Diagnosis not present

## 2024-05-03 DIAGNOSIS — I10 Essential (primary) hypertension: Secondary | ICD-10-CM | POA: Diagnosis not present

## 2024-05-06 ENCOUNTER — Ambulatory Visit (HOSPITAL_COMMUNITY): Admitting: Anesthesiology

## 2024-05-06 ENCOUNTER — Ambulatory Visit (HOSPITAL_COMMUNITY)
Admission: RE | Admit: 2024-05-06 | Discharge: 2024-05-06 | Disposition: A | Discharge status: Home / Self Care | Attending: Ophthalmology | Admitting: Ophthalmology

## 2024-05-06 ENCOUNTER — Other Ambulatory Visit: Payer: Self-pay

## 2024-05-06 ENCOUNTER — Encounter (HOSPITAL_COMMUNITY)
Admission: RE | Disposition: A | Discharge status: Home / Self Care | Payer: Self-pay | Source: Home / Self Care | Attending: Ophthalmology

## 2024-05-06 ENCOUNTER — Encounter (HOSPITAL_COMMUNITY): Payer: Self-pay | Admitting: Ophthalmology

## 2024-05-06 DIAGNOSIS — Z79890 Hormone replacement therapy (postmenopausal): Secondary | ICD-10-CM | POA: Insufficient documentation

## 2024-05-06 DIAGNOSIS — E039 Hypothyroidism, unspecified: Secondary | ICD-10-CM | POA: Diagnosis not present

## 2024-05-06 DIAGNOSIS — I10 Essential (primary) hypertension: Secondary | ICD-10-CM | POA: Diagnosis not present

## 2024-05-06 DIAGNOSIS — F32A Depression, unspecified: Secondary | ICD-10-CM | POA: Insufficient documentation

## 2024-05-06 DIAGNOSIS — H2512 Age-related nuclear cataract, left eye: Secondary | ICD-10-CM

## 2024-05-06 DIAGNOSIS — F419 Anxiety disorder, unspecified: Secondary | ICD-10-CM | POA: Insufficient documentation

## 2024-05-06 DIAGNOSIS — F418 Other specified anxiety disorders: Secondary | ICD-10-CM | POA: Diagnosis not present

## 2024-05-06 HISTORY — PX: CATARACT EXTRACTION W/PHACO: SHX586

## 2024-05-06 SURGERY — PHACOEMULSIFICATION, CATARACT, WITH IOL INSERTION
Anesthesia: Monitor Anesthesia Care | Site: Eye | Laterality: Left

## 2024-05-06 MED ORDER — SODIUM HYALURONATE 10 MG/ML IO SOLUTION
PREFILLED_SYRINGE | INTRAOCULAR | Status: DC | PRN
  Administered 2024-05-06: .85 mL via INTRAOCULAR

## 2024-05-06 MED ORDER — BSS IO SOLN
INTRAOCULAR | Status: DC | PRN
Start: 2024-05-06 — End: 2024-05-06
  Administered 2024-05-06: 15 mL via INTRAOCULAR

## 2024-05-06 MED ORDER — MOXIFLOXACIN HCL 5 MG/ML IO SOLN
INTRAOCULAR | Status: DC | PRN
  Administered 2024-05-06: .3 mL via INTRACAMERAL

## 2024-05-06 MED ORDER — STERILE WATER FOR IRRIGATION IR SOLN
Status: DC | PRN
  Administered 2024-05-06: 1

## 2024-05-06 MED ORDER — TETRACAINE HCL 0.5 % OP SOLN
1.0000 [drp] | OPHTHALMIC | Status: AC | PRN
  Administered 2024-05-06 (×3): 1 [drp] via OPHTHALMIC

## 2024-05-06 MED ORDER — POVIDONE-IODINE 5 % OP SOLN
OPHTHALMIC | Status: DC | PRN
  Administered 2024-05-06: 1 via OPHTHALMIC

## 2024-05-06 MED ORDER — LIDOCAINE HCL (PF) 1 % IJ SOLN
INTRAMUSCULAR | Status: DC | PRN
  Administered 2024-05-06: 1 mL

## 2024-05-06 MED ORDER — BSS IO SOLN
INTRAOCULAR | Status: DC | PRN
  Administered 2024-05-06: 500 mL via OPHTHALMIC

## 2024-05-06 MED ORDER — TROPICAMIDE 1 % OP SOLN
1.0000 [drp] | OPHTHALMIC | Status: AC | PRN
  Administered 2024-05-06 (×3): 1 [drp] via OPHTHALMIC

## 2024-05-06 MED ORDER — LACTATED RINGERS IV SOLN: INTRAVENOUS | Status: DC

## 2024-05-06 MED ORDER — PHENYLEPHRINE HCL 2.5 % OP SOLN
1.0000 [drp] | OPHTHALMIC | Status: AC | PRN
  Administered 2024-05-06 (×3): 1 [drp] via OPHTHALMIC

## 2024-05-06 MED ORDER — SODIUM HYALURONATE 23MG/ML IO SOSY
PREFILLED_SYRINGE | INTRAOCULAR | Status: DC | PRN
  Administered 2024-05-06: .6 mL via INTRAOCULAR

## 2024-05-06 MED ORDER — LIDOCAINE HCL 3.5 % OP GEL
1.0000 | Freq: Once | OPHTHALMIC | Status: AC
  Administered 2024-05-06: 1 via OPHTHALMIC

## 2024-05-06 SURGICAL SUPPLY — 11 items
CLOTH BEACON ORANGE TIMEOUT ST (SAFETY) ×1 IMPLANT
EYE SHIELD UNIVERSAL CLEAR (GAUZE/BANDAGES/DRESSINGS) IMPLANT
FEE CATARACT SUITE SIGHTPATH (MISCELLANEOUS) ×1 IMPLANT
GLOVE BIOGEL PI IND STRL 7.0 (GLOVE) ×2 IMPLANT
LENS IOL TECNIS EYHANCE 19.5 (Intraocular Lens) IMPLANT
NDL HYPO 18GX1.5 BLUNT FILL (NEEDLE) ×1 IMPLANT
NEEDLE HYPO 18GX1.5 BLUNT FILL (NEEDLE) ×1 IMPLANT
PAD ARMBOARD POSITIONER FOAM (MISCELLANEOUS) ×1 IMPLANT
SYR TB 1ML LL NO SAFETY (SYRINGE) ×1 IMPLANT
TAPE SURG TRANSPORE 1 IN (GAUZE/BANDAGES/DRESSINGS) IMPLANT
WATER STERILE IRR 250ML POUR (IV SOLUTION) ×1 IMPLANT

## 2024-05-06 NOTE — Op Note (Signed)
 Date of procedure: 05/06/24  Pre-operative diagnosis: Visually significant age-related nuclear cataract, Left Eye (H25.12)  Post-operative diagnosis: Visually significant age-related nuclear cataract, Left Eye  Procedure: Removal of cataract via phacoemulsification and insertion of intra-ocular lens Vicci and Johnson DIB00 +19.5D into the capsular bag of the Left Eye  Attending surgeon: Lynwood LABOR. Smantha Boakye, MD, MA  Anesthesia: MAC, Topical Akten  Complications: None  Estimated Blood Loss: <66mL (minimal)  Specimens: None  Implants: As above  Indications:  Visually significant age-related cataract, Left Eye  Procedure:  The patient was seen and identified in the pre-operative area. The operative eye was identified and dilated.  The operative eye was marked.  Topical anesthesia was administered to the operative eye.     The patient was then to the operative suite and placed in the supine position.  A timeout was performed confirming the patient, procedure to be performed, and all other relevant information.   The patient's face was prepped and draped in the usual fashion for intra-ocular surgery.  A lid speculum was placed into the operative eye and the surgical microscope moved into place and focused.  An inferotemporal paracentesis was created using a 20 gauge paracentesis blade. Omidria was injected into the anterior chamber. Shugarcaine was injected into the anterior chamber.  Viscoelastic was injected into the anterior chamber.  A temporal clear-corneal main wound incision was created using a 2.72mm microkeratome.  A continuous curvilinear capsulorrhexis was initiated using an irrigating cystitome and completed using capsulorrhexis forceps.  Hydrodissection and hydrodeliniation were performed.  Viscoelastic was injected into the anterior chamber.  A phacoemulsification handpiece and a chopper as a second instrument were used to remove the nucleus and epinucleus. The irrigation/aspiration  handpiece was used to remove any remaining cortical material.   The capsular bag was reinflated with viscoelastic, checked, and found to be intact.  The intraocular lens was inserted into the capsular bag.  The irrigation/aspiration handpiece was used to remove any remaining viscoelastic.  The clear corneal wound and paracentesis wounds were then hydrated and checked with Weck-Cels to be watertight. 0.1mL of moxifloxacin was injected into the anterior chamber. The lid-speculum and drape was removed, and the patient's face was cleaned with a wet and dry 4x4. A clear shield was taped over the eye. The patient was taken to the post-operative care unit in good condition, having tolerated the procedure well.  Post-Op Instructions: The patient will follow up at [facility] for a same day post-operative evaluation and will receive all other orders and instructions.

## 2024-05-06 NOTE — Interval H&P Note (Signed)
 History and Physical Interval Note:  05/06/2024 8:54 AM  Christine Burns  has presented today for surgery, with the diagnosis of nuclear sclerotic cataract, left eye.  The various methods of treatment have been discussed with the patient and family. After consideration of risks, benefits and other options for treatment, the patient has consented to  Procedure(s): PHACOEMULSIFICATION, CATARACT, WITH IOL INSERTION (Left) as a surgical intervention.  The patient's history has been reviewed, patient examined, no change in status, stable for surgery.  I have reviewed the patient's chart and labs.  Questions were answered to the patient's satisfaction.     HARRIE AGENT

## 2024-05-06 NOTE — Transfer of Care (Signed)
 Immediate Anesthesia Transfer of Care Note  Patient: Christine Burns  Procedure(s) Performed: PHACOEMULSIFICATION, CATARACT, WITH IOL INSERTION (Left: Eye)  Patient Location: PACU  Anesthesia Type:MAC  Level of Consciousness: awake, alert , and oriented  Airway & Oxygen Therapy: Patient Spontanous Breathing  Post-op Assessment: Report given to RN and Post -op Vital signs reviewed and stable  Post vital signs: Reviewed and stable  Last Vitals:  Vitals Value Taken Time  BP    Temp    Pulse    Resp    SpO2      Last Pain:  Vitals:   05/06/24 0820  PainSc: 5          Complications: No notable events documented.

## 2024-05-06 NOTE — Discharge Instructions (Signed)
 Please discharge patient when stable, will follow up today with Dr. June Leap at the [facility] office immediately following discharge.  Leave shield in place until visit.  All paperwork with discharge instructions will be given at the office.  [facility] Address:  [address]  [place], [place] [address]

## 2024-05-06 NOTE — Anesthesia Preprocedure Evaluation (Signed)
 Anesthesia Evaluation  Patient identified by MRN, date of birth, ID band Patient awake    Reviewed: Allergy & Precautions, H&P , NPO status , Patient's Chart, lab work & pertinent test results, reviewed documented beta blocker date and time   Airway Mallampati: II  TM Distance: >3 FB Neck ROM: full    Dental no notable dental hx.    Pulmonary neg pulmonary ROS   Pulmonary exam normal breath sounds clear to auscultation       Cardiovascular Exercise Tolerance: Good hypertension, negative cardio ROS  Rhythm:regular Rate:Normal     Neuro/Psych  PSYCHIATRIC DISORDERS Anxiety Depression     Neuromuscular disease negative neurological ROS  negative psych ROS   GI/Hepatic negative GI ROS, Neg liver ROS,,,  Endo/Other  negative endocrine ROSHypothyroidism    Renal/GU Renal diseasenegative Renal ROS  negative genitourinary   Musculoskeletal   Abdominal   Peds  Hematology negative hematology ROS (+) Blood dyscrasia, anemia   Anesthesia Other Findings   Reproductive/Obstetrics negative OB ROS                              Anesthesia Physical Anesthesia Plan  ASA: 2  Anesthesia Plan: MAC   Post-op Pain Management:    Induction:   PONV Risk Score and Plan:   Airway Management Planned:   Additional Equipment:   Intra-op Plan:   Post-operative Plan:   Informed Consent: I have reviewed the patients History and Physical, chart, labs and discussed the procedure including the risks, benefits and alternatives for the proposed anesthesia with the patient or authorized representative who has indicated his/her understanding and acceptance.     Dental Advisory Given  Plan Discussed with: CRNA  Anesthesia Plan Comments:         Anesthesia Quick Evaluation

## 2024-05-06 NOTE — Anesthesia Postprocedure Evaluation (Signed)
 Anesthesia Post Note  Patient: Christine Burns  Procedure(s) Performed: PHACOEMULSIFICATION, CATARACT, WITH IOL INSERTION (Left: Eye)  Patient location during evaluation: Phase II Anesthesia Type: MAC Level of consciousness: awake Pain management: pain level controlled Vital Signs Assessment: post-procedure vital signs reviewed and stable Respiratory status: spontaneous breathing and respiratory function stable Cardiovascular status: blood pressure returned to baseline and stable Postop Assessment: no headache and no apparent nausea or vomiting Anesthetic complications: no Comments: Late entry   No notable events documented.   Last Vitals:  Vitals:   05/06/24 0820 05/06/24 0921  BP: (!) 178/76 (!) 158/81  Pulse: (!) 53 61  Resp: 16 12  Temp: 36.9 C 36.7 C  SpO2: 97% 98%    Last Pain:  Vitals:   05/06/24 0921  TempSrc: Oral  PainSc: 0-No pain                 [name]

## 2024-05-14 ENCOUNTER — Ambulatory Visit (HOSPITAL_COMMUNITY)

## 2024-05-14 ENCOUNTER — Encounter (HOSPITAL_COMMUNITY)

## 2024-05-20 ENCOUNTER — Ambulatory Visit (HOSPITAL_COMMUNITY): Admit: 2024-05-20 | Admitting: Ophthalmology

## 2024-05-20 SURGERY — PHACOEMULSIFICATION, CATARACT, WITH IOL INSERTION
Anesthesia: Monitor Anesthesia Care | Laterality: Right

## 2024-07-09 NOTE — Progress Notes (Signed)
 Chief complaint: Pt is s/p Procedures: C6-T2 posterior cervical fusion   Subjective: Patient was seen and evaluated on rounds. Doing well. Complains of incisional pain. Preoperative pain has improved. Radicular pain improved in left arm. Patient is passing flatus. Patient denies any chest pain, shortness of breath, nausea, or vomiting.   Principal Problem:   Spinal stenosis, cervical region  Medical History[1] Surgical History[2]  Vital signs in last 24 hours: Temp:  [97.9 F (36.6 C)-98.7 F (37.1 C)] 98.7 F (37.1 C) Heart Rate:  [67-94] 84 Resp:  [12-19] 17 BP: (113-178)/(53-89) 134/64  Intake/Output last 3 shifts: I/O last 3 completed shifts: In: 2157 [P.O.:240; I.V.:1317; IV Piggyback:600] Out: 2410 [Urine:2075; Drains:135; Blood:200] Intake/Output this shift: I/O this shift: In: 10 [I.V.:10] Out: -     Physical exam: Physical Exam Neurological Exam Motor and neurovascularly intact. Strength 5/5. Negative Homan's  Assessment/Plan: 1 Day Post-Op  s/p Procedures: C6-T2 posterior cervical fusion :  - Continue spine precautions. Soft collar to ambulate - Continue to progress activity through PT/OT - Daily dressing changes - Will discontinue hemovac drain - Post op xrays ordered. Will review when completed - Post-op labs stable - EDD today pending progress with PT and discharge plans    Kristyn Heagen PA-C       [1] Past Medical History: Diagnosis Date   Anemia    Arthritis    Back, generalized   Chronic pain    CKD (chronic kidney disease), stage III (CMD)    does not see nephrologist   Fibromyalgia    Hypertension    Multiple drug allergies    Pain management    Dr. Alexa at [facility]   Palpitations    Spinal instability of thoracolumbar region    Thyroid  disease    Tremor of both hands   [2] Past Surgical History: Procedure Laterality Date   ANTERIOR CERVICAL DISCECTOMY W/ FUSION     Procedure: ANTERIOR CERVICAL DISCECTOMY W/  FUSION   BACK SURGERY     Procedure: BACK SURGERY; lumbar discectomy   COLONOSCOPY     Procedure: COLONOSCOPY   HYSTERECTOMY      Procedure: HYSTERECTOMY; abdominal with BSO   LUMBAR FUSION     Procedure: LUMBAR FUSION   REDUCTION MAMMAPLASTY     THORACIC LAMINECTOMY N/A 11/13/2017   Procedure: T10-Pelvis Instrumentation and Fusion, Hardware Removal, Osteotomy;  Surgeon: Royden Elsie Schneider, MD;  Location: [facility] MAIN OR;  Service: Orthopedics;  Laterality: N/A;  Stim Neuromonitoring, Prone, O-Arm Cell Saver, [employer], [employer], [employer], Justin, PACS on [facility]   TOTAL KNEE ARTHROPLASTY Left 2020   at [facility]

## 2024-07-09 NOTE — Care Plan (Signed)
 Problem: Health Behavior: Goal: MCB Ability to state ways to decrease the risk of falls will be met by discharge Description: Ability to state ways to decrease the risk of falls will improve by discharge Outcome: Progressing   Problem: Safety: Goal: Will remain free from falls by discharge Description: Will remain free from falls by discharge Outcome: Progressing Goal: Safe Environmental Mobility Description: Ability to move safely in the environment will improve by discharge Outcome: Progressing   Problem: Pain Goal: Patient will identify the triggers to increased pain and available methods for controlling pain (LTG) Outcome: Progressing Goal: Patient will comply with medication and/or non-medication comfort measures (STG) Outcome: Progressing Goal: Patient will notify staff of comfort measures that have been useful in the past (STG) Outcome: Progressing Goal: Patient will report any pain score less than or equal to the patient's identified level of pain (STG) Outcome: Progressing   Problem: Health Behavior Goal: Agrees to [employer] All Discharge Instructions Description: Will agree to carry out all discharge instructions by discharge Outcome: Progressing Goal: States Signs and Symptoms to Report to Health Care Provider Description: Ability to state signs and symptoms to report to health care provider will improve by discharge Outcome: Progressing   Problem: Activity: Goal: Ambulation Ability Description: Ability to ambulate will improve by discharge Outcome: Progressing Goal: Changes Position in Bed Independently Description: Ability to safely and independently change position in bed will improve by discharge Outcome: Progressing Goal: Demonstrates Log-Rolling Technique Description: Ability to demonstrate correct log-rolling technique will improve by discharge Outcome: Progressing Goal: Tolerates Increased Activity Description: Ability to tolerate increased activity will  improve by discharge Outcome: Progressing   Problem: Bowel/Gastric: Goal: Establishes Normal Bowel Function Description: Establishment of normal bowel function will improve by discharge Outcome: Progressing   Problem: Health Behavior: Goal: Therapeutic Regimen Knowledge Description: Knowledge of the prescribed therapeutic regimen will improve by discharge Outcome: Progressing   Problem: Nutritional: Goal: Maintains Optimal Nutritional Status Description: Ability to attain and maintain optimal nutritional status will improve by discharge Outcome: Progressing   Problem: Physical Regulation: Goal: Ability to maintain vital signs within normal range will improve by discharge Outcome: Progressing Goal: Will remain free from infection Outcome: Progressing   Problem: Respiratory: Goal: Maintains Clear Airway Description: Ability to maintain a clear airway will improve by discharge Outcome: Progressing Goal: Ability to maintain normal pulse oximetry readings will improve by discharge Description: Ability to maintain normal pulse oximetry readings will improve by discharge Outcome: Progressing   Problem: Self-Care: Goal:  Activities of Daily Living Performance Description: Ability to perform activities of daily living will improve by discharge Outcome: Progressing   Problem: Sensory: Goal: Comfort Level Description: General experience of comfort will improve by discharge Outcome: Progressing Goal: Identifies Factors That Increase Pain Description: Ability to identify factors that increase the pain will improve by discharge Outcome: Progressing Goal: Satisfaction With Pain Management Regimen Description: Satisfaction with pain management regimen will improve by discharge Outcome: Progressing   Problem: Skin Integrity: Goal: Skin Integrity Status Description: Skin integrity will improve by discharge Outcome: Progressing Goal: Demonstrates Proper Wound Care Description:  Ability to demonstrate proper wound care will improve by discharge Outcome: Progressing   Problem: Tissue Perfusion: Goal: Risk of venous thrombosis will decrease by discharge Description: Risk of venous thrombosis will decrease by discharge Outcome: Progressing   Problem: Urinary Elimination: Goal: Achieves and Maintains Adequate Urine Output Description: Ability to achieve and maintain adequate urine output will improve by discharge Outcome: Progressing   Problem: Risk for Fall-Universal Goal:  Fall Prevention During Hospitalization-Universal Outcome: Progressing

## 2024-07-09 NOTE — Progress Notes (Signed)
 Division of Pharmacy Services  Medication History Completion Note  Name/DOB/Age of Patient: Christine Burns / [birth year] / 78 y.o.  Location: [facility]  Type: Hospital admission & Modality: In-Person  Confirmed two patient identifiers: Yes  Confirmed patient is alert & oriented: Yes  Medication History Source (Med History Informants):  Patient Dispense History   Asked about any missing medications (such as pumps, injectable meds, TPN, OTC, etc): Yes  PTA Med List:  Prior to Admission Medications     Reviewed by Sharlet Richerd Pander, CPhT on 07/09/24 at 1413    Medication Sig Last Dose Informant Taking? Status  amLODIPine  (NORVASC ) 5 mg tablet Take 5 mg by mouth as needed (for BP > 140/80). 07/08/2024  7:00 AM Self Yes Active         Med Note HONOR, AMANDA   Wed Jul 03, 2024  1:30 PM)    05/02/2024 5 mg tab (disp 45, 90d supply)    Discontinued 07/09/24 1407 (Side effects)  dexmethylphenidate (FOCALIN) 10 mg tab tablet Take 10 mg by mouth 2 (two) times a day. do not take after 2pm 07/07/2024 Self Yes Active         Med Note [facility], AMANDA   Wed Jul 03, 2024  1:30 PM)  06/08/2024 10 mg tab (disp 60, 30d supply)    levothyroxine  (SYNTHROID ) 100 mcg tablet Take 100 mcg by mouth Once Daily. 07/07/2024 Self Yes Active         Med Note HONOR, AMANDA   Wed Jul 03, 2024  1:30 PM)  05/02/2024 100 mcg tab (disp 90, 90d supply)    Linzess 72 mcg cap capsule Take 72 mcg by mouth daily. 07/07/2024 Self Yes Active         Med Note [facility]ONOR, AMANDA   Wed Jul 03, 2024  1:30 PM)  07/01/2024 72 mcg cap (disp 30, 30d supply)    loratadine  (CLARITIN ) 10 mg tablet Take 10 mg by mouth daily as needed. Past Week Self Yes Active  methadone  (DOLOPHINE ) 10 mg tablet Take 20 mg by mouth 2 (two) times a day 07/08/2024  7:00 AM Self Yes Active         Med Note [facility], AMANDA   Wed Jul 03, 2024  1:30 PM)  06/25/2024 10 mg tab (disp 120, 30d supply)    ondansetron   (ZOFRAN ) 4 mg tablet Take 4 mg by mouth every 8 (eight) hours as needed for nausea or vomiting. Unknown Self No Active         Med Note [facility]ONOR, AMANDA   Wed Jul 03, 2024  1:30 PM)  01/10/2024 8 mg tab (disp 15, 5d supply)    oxyCODONE  (ROXICODONE ) 10 mg tab Take 20 mg by mouth daily. Takes at noon. Unknown Self No Active         Med Note [facility]ONOR, AMANDA   Wed Jul 03, 2024  1:31 PM)    06/11/2024 10 mg tab (disp 60, 30d supply)    pregabalin  (LYRICA ) 100 mg capsule Take 100 mg by mouth 2 (two) times a day. 07/07/2024 Self Yes Active         Med Note [facility]ONOR, AMANDA   Wed Jul 03, 2024  1:31 PM)    07/01/2024 100 mg cap (disp 60, 30d supply)    propranolol  XL 80 mg cp24 Take 80 mg by mouth daily. 07/08/2024 Morning Self Yes Active         Med Note [facility]ON[facility], AMANDA   Wed Jul 03, 2024  1:31 PM)  06/03/2024 80 mg Cs24 (disp 90, 90d supply)    psyllium (METAMUCIL/KONSYL) 1 packet Take 1 packet by mouth as needed for constipation. 07/07/2024 Self Yes Active  traZODone  (DESYREL ) 100 mg tablet Take 100 mg by mouth nightly. 07/07/2024 Bedtime Self Yes Active         Med Note [facility], AMANDA   Wed Jul 03, 2024  1:31 PM)    07/01/2024 100 mg tab (disp 30, 30d supply)             Audit from Redirected Encounters   **Prior to Admission medications have not yet been reviewed for this encounter**     Selected Pharmacy:  [facility] - [place], [place] - [address] - PHONE: [phone] - FAX: [phone]  Comments: Spoke with patient again for clarification on PTA list per pharmacist. Changed propranolol  to 80 mg XL once daily. Removed propranolol  20 mg, patient confirmed that she is only taking the 80 mg once daily. Removed Pyridum (completed therapy) and baclofen  10 mg (stated medication is too sedating). Changed oxycodone  to 20 mg once daily (afternoon), methadone  to 20 mg twice daily (morning and evening) and loratadine  to as needed. I did not add pantoprazole ,  patient stated that she does not need this medication. Messaged Rebecca Satko, RPH and Kristyn Heagen, MD. Electronically signed by: Sharlet Richerd Pander, CPhT 07/09/2024 2:13 PM  Medications reconciled by provider: No   Signature/Co-signature, if required: Sharlet Richerd Pander, CPhT   Date/Time: 07/09/2024 2:14 PM

## 2024-07-09 NOTE — Progress Notes (Signed)
 Case Management Discharge Note        CSN: [id number] DOB: 20-May-1946 Service: Orthopedics Location: 809/01  Patient Class: Inpatient  DC Disposition: : Home Health Care  Discharge DC Disposition: : Home Health Care Homecare Referral: (PT) Physical Therapy Homecare/Hospice Agency(s) chosen: [facility]  Discharge Referrals Patient Preference: Chosen geographical local area/county shared with patient/family: Yes Patient Preference for Post-Acute Provider Form completed: Yes Case closed, patient/family agree with disposition plan: Yes  Therapy recommending HHPT, she states no preference referral given to Darleene Gowda with [facility] [phone], unable to accept. Referral given to Zebedee Braver ([phone]) with [facility], accepted.      Case Management Coordination Status: Coordination Complete     Charmaine GORMAN Hacking, RN

## 2024-07-09 NOTE — Care Plan (Signed)
 Problem: Health Behavior: Goal: MCB Ability to state ways to decrease the risk of falls will be met by discharge Description: Ability to state ways to decrease the risk of falls will improve by discharge 07/09/2024 1501 by Christine Lyle Axon, RN Outcome: Adequate for Discharge 07/09/2024 0908 by Christine Lyle Axon, RN Outcome: Progressing   Problem: Safety: Goal: Will remain free from falls by discharge Description: Will remain free from falls by discharge 07/09/2024 1501 by Christine Lyle Axon, RN Outcome: Adequate for Discharge 07/09/2024 0908 by Christine Lyle Axon, RN Outcome: Progressing Goal: Safe Environmental Mobility Description: Ability to move safely in the environment will improve by discharge 07/09/2024 1501 by Christine Lyle Axon, RN Outcome: Adequate for Discharge 07/09/2024 0908 by Christine Lyle Axon, RN Outcome: Progressing   Problem: Pain Goal: Patient will identify the triggers to increased pain and available methods for controlling pain (LTG) 07/09/2024 1501 by Christine Lyle Axon, RN Outcome: Adequate for Discharge 07/09/2024 0908 by Christine Lyle Axon, RN Outcome: Progressing Goal: Patient will comply with medication and/or non-medication comfort measures (STG) 07/09/2024 1501 by Christine Lyle Axon, RN Outcome: Adequate for Discharge 07/09/2024 0908 by Christine Lyle Axon, RN Outcome: Progressing Goal: Patient will notify staff of comfort measures that have been useful in the past (STG) 07/09/2024 1501 by Christine Lyle Axon, RN Outcome: Adequate for Discharge 07/09/2024 0908 by Christine Lyle Axon, RN Outcome: Progressing Goal: Patient will report any pain score less than or equal to the patient's identified level of pain (STG) 07/09/2024 1501 by Christine Lyle Axon, RN Outcome: Adequate for Discharge 07/09/2024 0908 by Christine Lyle Axon, RN Outcome: Progressing   Problem: Health Behavior Goal: Agrees to Carry Out All Discharge  Instructions Description: Will agree to carry out all discharge instructions by discharge 07/09/2024 1501 by Christine Lyle Axon, RN Outcome: Adequate for Discharge 07/09/2024 0908 by Christine Lyle Axon, RN Outcome: Progressing Goal: States Signs and Symptoms to Report to Health Care Provider Description: Ability to state signs and symptoms to report to health care provider will improve by discharge 07/09/2024 1501 by Christine Lyle Axon, RN Outcome: Adequate for Discharge 07/09/2024 0908 by Christine Lyle Axon, RN Outcome: Progressing   Problem: Activity: Goal: Ambulation Ability Description: Ability to ambulate will improve by discharge 07/09/2024 1501 by Christine Lyle Axon, RN Outcome: Adequate for Discharge 07/09/2024 0908 by Christine Lyle Axon, RN Outcome: Progressing Goal: Changes Position in Bed Independently Description: Ability to safely and independently change position in bed will improve by discharge 07/09/2024 1501 by Christine Lyle Axon, RN Outcome: Adequate for Discharge 07/09/2024 0908 by Christine Lyle Axon, RN Outcome: Progressing Goal: Demonstrates Log-Rolling Technique Description: Ability to demonstrate correct log-rolling technique will improve by discharge 07/09/2024 1501 by Christine Lyle Axon, RN Outcome: Adequate for Discharge 07/09/2024 0908 by Christine Lyle Axon, RN Outcome: Progressing Goal: Tolerates Increased Activity Description: Ability to tolerate increased activity will improve by discharge 07/09/2024 1501 by Christine Lyle Axon, RN Outcome: Adequate for Discharge 07/09/2024 0908 by Christine Lyle Axon, RN Outcome: Progressing   Problem: Bowel/Gastric: Goal: Establishes Normal Bowel Function Description: Establishment of normal bowel function will improve by discharge 07/09/2024 1501 by Christine Lyle Axon, RN Outcome: Adequate for Discharge 07/09/2024 0908 by Christine Lyle Axon, RN Outcome: Progressing   Problem: Health  Behavior: Goal: Therapeutic Regimen Knowledge Description: Knowledge of the prescribed therapeutic regimen will improve by discharge 07/09/2024 1501 by Christine Lyle Axon, RN Outcome: Adequate for Discharge 07/09/2024 0908 by Christine Lyle Axon, RN Outcome: Progressing   Problem: Nutritional: Goal: Maintains  Optimal Nutritional Status Description: Ability to attain and maintain optimal nutritional status will improve by discharge 07/09/2024 1501 by Christine Lyle Axon, RN Outcome: Adequate for Discharge 07/09/2024 0908 by Christine Lyle Axon, RN Outcome: Progressing   Problem: Physical Regulation: Goal: Ability to maintain vital signs within normal range will improve by discharge 07/09/2024 1501 by Christine Lyle Axon, RN Outcome: Adequate for Discharge 07/09/2024 0908 by Christine Lyle Axon, RN Outcome: Progressing Goal: Will remain free from infection 07/09/2024 1501 by Christine Lyle Axon, RN Outcome: Adequate for Discharge 07/09/2024 0908 by Christine Lyle Axon, RN Outcome: Progressing   Problem: Respiratory: Goal: Maintains Clear Airway Description: Ability to maintain a clear airway will improve by discharge 07/09/2024 1501 by Christine Lyle Axon, RN Outcome: Adequate for Discharge 07/09/2024 0908 by Christine Lyle Axon, RN Outcome: Progressing Goal: Ability to maintain normal pulse oximetry readings will improve by discharge Description: Ability to maintain normal pulse oximetry readings will improve by discharge 07/09/2024 1501 by Christine Lyle Axon, RN Outcome: Adequate for Discharge 07/09/2024 0908 by Christine Lyle Axon, RN Outcome: Progressing   Problem: Self-Care: Goal:  Activities of Daily Living Performance Description: Ability to perform activities of daily living will improve by discharge 07/09/2024 1501 by Christine Lyle Axon, RN Outcome: Adequate for Discharge 07/09/2024 0908 by Christine Lyle Axon, RN Outcome: Progressing   Problem:  Sensory: Goal: Comfort Level Description: General experience of comfort will improve by discharge 07/09/2024 1501 by Christine Lyle Axon, RN Outcome: Adequate for Discharge 07/09/2024 0908 by Christine Lyle Axon, RN Outcome: Progressing Goal: Identifies Factors That Increase Pain Description: Ability to identify factors that increase the pain will improve by discharge 07/09/2024 1501 by Christine Lyle Axon, RN Outcome: Adequate for Discharge 07/09/2024 0908 by Christine Lyle Axon, RN Outcome: Progressing Goal: Satisfaction With Pain Management Regimen Description: Satisfaction with pain management regimen will improve by discharge 07/09/2024 1501 by Christine Lyle Axon, RN Outcome: Adequate for Discharge 07/09/2024 0908 by Christine Lyle Axon, RN Outcome: Progressing   Problem: Skin Integrity: Goal: Skin Integrity Status Description: Skin integrity will improve by discharge 07/09/2024 1501 by Christine Lyle Axon, RN Outcome: Adequate for Discharge 07/09/2024 0908 by Christine Lyle Axon, RN Outcome: Progressing Goal: Demonstrates Proper Wound Care Description: Ability to demonstrate proper wound care will improve by discharge 07/09/2024 1501 by Christine Lyle Axon, RN Outcome: Adequate for Discharge 07/09/2024 0908 by Christine Lyle Axon, RN Outcome: Progressing   Problem: Tissue Perfusion: Goal: Risk of venous thrombosis will decrease by discharge Description: Risk of venous thrombosis will decrease by discharge 07/09/2024 1501 by Christine Lyle Axon, RN Outcome: Adequate for Discharge 07/09/2024 0908 by Christine Lyle Axon, RN Outcome: Progressing   Problem: Urinary Elimination: Goal: Achieves and Maintains Adequate Urine Output Description: Ability to achieve and maintain adequate urine output will improve by discharge 07/09/2024 1501 by Christine Lyle Axon, RN Outcome: Adequate for Discharge 07/09/2024 0908 by Christine Lyle Axon, RN Outcome: Progressing    Problem: Risk for Fall-Universal Goal: Fall Prevention During Hospitalization-Universal 07/09/2024 1501 by Christine Lyle Axon, RN Outcome: Adequate for Discharge 07/09/2024 0908 by Christine Lyle Axon, RN Outcome: Progressing

## 2024-07-09 NOTE — Discharge Summary (Signed)
 Spine Discharge Summary   Admit date: 07/08/2024  Discharge date: 07/09/24  Discharge to: Home  Discharge Service: Orthopedic Spine  Discharge Attending Physician: Royden Elsie Schneider, MD  Discharge  Diagnosis: Procedures: C6-T2 posterior cervical fusion  Secondary Diagnoses: Principal Problem:   Spinal stenosis, cervical region Active Problems:   Abnormality of gait and mobility   Alteration in self-care ability   OR Procedures: Procedures: C6-T2 posterior cervical fusion  Discharge Day Services:  The patient was seen and evaluated by the Ortho Spine team on the day of discharge and deemed stable for discharge, including stable labs, vital signs, radiographic studies, and neurologic exam.  Discharge instructions were given and explained.  Questions were answered.  Physical Examination:  General Appearance:  alert, appears stated age, and cooperative Extremities:   extremities normal, atraumatic, no cyanosis or edema Skin:   Skin color, texture, turgor normal. No rashes or lesions Neurologic:   Grossly normal   Hospital Course: PT underwent the above procedure without complications. Patient was transferred to PACU in stable condition and then to [facility]. Orthopedic spine protocol was followed. Pain is well controlled with current medications. Will dc home today after up with PT/OT x1. DME rec per PT/OT.    Condition at Discharge: Improved   Discharge Medications:    Medication List     CHANGE how you take these medications    baclofen  5 mg tablet Commonly known as: LIORESAL  Take 1 tablet (5 mg total) by mouth 3 (three) times a day as needed for muscle spasms. What changed:  medication strength when to take this reasons to take this   oxyCODONE  15 mg immediate release tablet Commonly known as: ROXICODONE  Take 1 tablet (15 mg total) by mouth every 4 (four) hours as needed for severe pain (7-10) or moderate pain (4-6). What changed:  medication strength how much  to take when to take this reasons to take this       CONTINUE taking these medications    amLODIPine  5 mg tablet Commonly known as: NORVASC  Take 5 mg by mouth as needed (for BP > 140/80).   dexmethylphenidate 10 mg Tab tablet Commonly known as: FOCALIN Take 10 mg by mouth 2 (two) times a day. do not take after 2pm   levothyroxine  100 mcg tablet Commonly known as: SYNTHROID  Take 100 mcg by mouth Once Daily.   Linzess 72 mcg Cap capsule Generic drug: linaCLOtide Take 1 capsule by mouth daily.   loratadine  10 mg tablet Commonly known as: CLARITIN  Take 10 mg by mouth daily.   methadone  10 mg tablet Commonly known as: DOLOPHINE  Take 10 mg by mouth every 6 (six) hours   ondansetron  4 mg tablet Commonly known as: ZOFRAN  Take 4 mg by mouth every 8 (eight) hours as needed for nausea or vomiting.   phenazopyridine 100 mg tablet Commonly known as: PYRIDIUM Take 1 tablet by mouth in the morning and 1 tablet at noon and 1 tablet in the evening. Take with meals.   pregabalin  100 mg capsule Commonly known as: LYRICA  Take 1 capsule by mouth 2 (two) times a day.   * propranoloL  80 mg tablet Commonly known as: INDERAL  Take 120 mg by mouth daily.   * propranoloL  20 mg tablet Commonly known as: INDERAL  Take 20 mg by mouth 2 (two) times a day.   psyllium 1 packet Commonly known as: METAMUCIL/KONSYL Take 1 packet by mouth as needed for constipation.   traZODone  100 mg tablet Commonly known as: DESYREL  Take 100  mg by mouth nightly.      * * This list has 2 medication(s) that are the same as other medications prescribed for you. Read the directions carefully, and ask your doctor or other care provider to review them with you.            Where to Get Your Medications     Information about where to get these medications is not yet available   Ask your nurse or doctor about these medications baclofen  5 mg tablet oxyCODONE  15 mg immediate release tablet        Kristyn Heagen PA-C  *Some images could not be shown.

## 2024-07-10 NOTE — ED Triage Notes (Signed)
 Patient arrived via EMS from home after a fall out of bed. Patient had surgery on Monday for spinal stenosis. Patients neck is hurting.

## 2024-07-10 NOTE — Nursing Note (Signed)
 SW spoke with PA Herlene regarding pt's discharge planning. Pt had neck surgery and discharged from the hospital yesterday. Came to [facility] due to neck pain. SW and SW Tawni Sierras discussed this over with PA Herlene. Therapy likely unable to see pt and the case managers are already gone so may be unable to handle this matter today and could be in the morning. Will explore this further and follow back up.  SW contacted PA Luke back after SW and SW [place] reviewed pt's chart more. Pt ambulated 200 feet with therapy yesterday so not SNF appropriate. Per pt's notes, home health was arranged with [facility]. SW Tawni will reach out to [facility] to follow up on pt's home health referral and see how soon they are able to initiate services. PA Luke plans to discharge pt home as pt is medically cleared.

## 2024-07-11 ENCOUNTER — Observation Stay (HOSPITAL_COMMUNITY)

## 2024-07-11 ENCOUNTER — Emergency Department (HOSPITAL_COMMUNITY)

## 2024-07-11 ENCOUNTER — Other Ambulatory Visit (HOSPITAL_COMMUNITY): Payer: Self-pay

## 2024-07-11 ENCOUNTER — Observation Stay (HOSPITAL_COMMUNITY)
Admission: EM | Admit: 2024-07-11 | Discharge: 2024-07-12 | Disposition: A | Discharge status: Home / Self Care | Attending: Internal Medicine | Admitting: Internal Medicine

## 2024-07-11 ENCOUNTER — Telehealth (HOSPITAL_COMMUNITY): Payer: Self-pay

## 2024-07-11 ENCOUNTER — Encounter (HOSPITAL_COMMUNITY): Payer: Self-pay

## 2024-07-11 ENCOUNTER — Other Ambulatory Visit: Payer: Self-pay

## 2024-07-11 DIAGNOSIS — W19XXXA Unspecified fall, initial encounter: Secondary | ICD-10-CM | POA: Diagnosis not present

## 2024-07-11 DIAGNOSIS — Y929 Unspecified place or not applicable: Secondary | ICD-10-CM | POA: Diagnosis not present

## 2024-07-11 DIAGNOSIS — M797 Fibromyalgia: Secondary | ICD-10-CM | POA: Diagnosis not present

## 2024-07-11 DIAGNOSIS — Z1152 Encounter for screening for COVID-19: Secondary | ICD-10-CM | POA: Insufficient documentation

## 2024-07-11 DIAGNOSIS — R0602 Shortness of breath: Secondary | ICD-10-CM | POA: Insufficient documentation

## 2024-07-11 DIAGNOSIS — J9601 Acute respiratory failure with hypoxia: Principal | ICD-10-CM | POA: Insufficient documentation

## 2024-07-11 DIAGNOSIS — R509 Fever, unspecified: Secondary | ICD-10-CM

## 2024-07-11 DIAGNOSIS — Z981 Arthrodesis status: Secondary | ICD-10-CM | POA: Diagnosis not present

## 2024-07-11 DIAGNOSIS — R339 Retention of urine, unspecified: Secondary | ICD-10-CM | POA: Insufficient documentation

## 2024-07-11 DIAGNOSIS — Z79899 Other long term (current) drug therapy: Secondary | ICD-10-CM | POA: Diagnosis not present

## 2024-07-11 DIAGNOSIS — M25551 Pain in right hip: Secondary | ICD-10-CM

## 2024-07-11 DIAGNOSIS — I2699 Other pulmonary embolism without acute cor pulmonale: Principal | ICD-10-CM | POA: Diagnosis present

## 2024-07-11 DIAGNOSIS — N390 Urinary tract infection, site not specified: Secondary | ICD-10-CM | POA: Diagnosis not present

## 2024-07-11 DIAGNOSIS — R6510 Systemic inflammatory response syndrome (SIRS) of non-infectious origin without acute organ dysfunction: Secondary | ICD-10-CM | POA: Insufficient documentation

## 2024-07-11 DIAGNOSIS — R600 Localized edema: Secondary | ICD-10-CM | POA: Insufficient documentation

## 2024-07-11 DIAGNOSIS — M4322 Fusion of spine, cervical region: Secondary | ICD-10-CM | POA: Diagnosis not present

## 2024-07-11 LAB — URINALYSIS, ROUTINE W REFLEX MICROSCOPIC
Bacteria, UA: NONE SEEN
Bilirubin Urine: NEGATIVE
Glucose, UA: NEGATIVE mg/dL
Ketones, ur: 5 mg/dL — AB
Nitrite: NEGATIVE
Protein, ur: 30 mg/dL — AB
Specific Gravity, Urine: 1.015 (ref 1.005–1.030)
pH: 7 (ref 5.0–8.0)

## 2024-07-11 LAB — BASIC METABOLIC PANEL WITH GFR
Anion gap: 5 (ref 5–15)
Anion gap: 4 — ABNORMAL LOW (ref 5–15)
BUN: 14 mg/dL (ref 8–23)
BUN: 13 mg/dL (ref 8–23)
CO2: 35 mmol/L — ABNORMAL HIGH (ref 22–32)
CO2: 35 mmol/L — ABNORMAL HIGH (ref 22–32)
Calcium: 8.9 mg/dL (ref 8.9–10.3)
Calcium: 8.5 mg/dL — ABNORMAL LOW (ref 8.9–10.3)
Chloride: 96 mmol/L — ABNORMAL LOW (ref 98–111)
Chloride: 98 mmol/L (ref 98–111)
Creatinine, Ser: 0.96 mg/dL (ref 0.44–1.00)
Creatinine, Ser: 0.89 mg/dL (ref 0.44–1.00)
GFR, Estimated: >60 mL/min (ref >60)
GFR, Estimated: >60 mL/min (ref >60)
Glucose, Bld: 142 mg/dL — ABNORMAL HIGH (ref 70–99)
Glucose, Bld: 135 mg/dL — ABNORMAL HIGH (ref 70–99)
Potassium: 3.8 mmol/L (ref 3.5–5.1)
Potassium: 3.8 mmol/L (ref 3.5–5.1)
Sodium: 136 mmol/L (ref 135–145)
Sodium: 137 mmol/L (ref 135–145)

## 2024-07-11 LAB — CBC WITH DIFFERENTIAL/PLATELET
Abs Immature Granulocytes: 0.05 K/uL (ref 0.00–0.07)
Abs Immature Granulocytes: 0.08 K/uL — ABNORMAL HIGH (ref 0.00–0.07)
Basophils Absolute: 0 K/uL (ref 0.0–0.1)
Basophils Absolute: 0 K/uL (ref 0.0–0.1)
Basophils Relative: 0 %
Basophils Relative: 0 %
Eosinophils Absolute: 0 K/uL (ref 0.0–0.5)
Eosinophils Absolute: 0 K/uL (ref 0.0–0.5)
Eosinophils Relative: 0 %
Eosinophils Relative: 0 %
HCT: 35.2 % — ABNORMAL LOW (ref 36.0–46.0)
HCT: 32.7 % — ABNORMAL LOW (ref 36.0–46.0)
Hemoglobin: 11.6 g/dL — ABNORMAL LOW (ref 12.0–15.0)
Hemoglobin: 10.6 g/dL — ABNORMAL LOW (ref 12.0–15.0)
Immature Granulocytes: 0 %
Immature Granulocytes: 1 %
Lymphocytes Relative: 5 %
Lymphocytes Relative: 10 %
Lymphs Abs: 0.8 K/uL (ref 0.7–4.0)
Lymphs Abs: 1.4 K/uL (ref 0.7–4.0)
MCH: 30.7 pg (ref 26.0–34.0)
MCH: 30.5 pg (ref 26.0–34.0)
MCHC: 33 g/dL (ref 30.0–36.0)
MCHC: 32.4 g/dL (ref 30.0–36.0)
MCV: 93.1 fL (ref 80.0–100.0)
MCV: 94.2 fL (ref 80.0–100.0)
Monocytes Absolute: 1.2 K/uL — ABNORMAL HIGH (ref 0.1–1.0)
Monocytes Absolute: 1.2 K/uL — ABNORMAL HIGH (ref 0.1–1.0)
Monocytes Relative: 8 %
Monocytes Relative: 8 %
Neutro Abs: 12.5 K/uL — ABNORMAL HIGH (ref 1.7–7.7)
Neutro Abs: 11.8 K/uL — ABNORMAL HIGH (ref 1.7–7.7)
Neutrophils Relative %: 87 %
Neutrophils Relative %: 81 %
Platelets: 171 K/uL (ref 150–400)
Platelets: 177 K/uL (ref 150–400)
RBC: 3.78 MIL/uL — ABNORMAL LOW (ref 3.87–5.11)
RBC: 3.47 MIL/uL — ABNORMAL LOW (ref 3.87–5.11)
RDW: 13.2 % (ref 11.5–15.5)
RDW: 13.2 % (ref 11.5–15.5)
WBC: 14.6 K/uL — ABNORMAL HIGH (ref 4.0–10.5)
WBC: 14.5 K/uL — ABNORMAL HIGH (ref 4.0–10.5)
nRBC: 0 % (ref 0.0–0.2)
nRBC: 0 % (ref 0.0–0.2)

## 2024-07-11 LAB — RESP PANEL BY RT-PCR (RSV, FLU A&B, COVID)  RVPGX2
Influenza A by PCR: NEGATIVE
Influenza B by PCR: NEGATIVE
Resp Syncytial Virus by PCR: NEGATIVE
SARS Coronavirus 2 by RT PCR: NEGATIVE

## 2024-07-11 LAB — TROPONIN T, HIGH SENSITIVITY
Troponin T High Sensitivity: 19 ng/L (ref 0–19)
Troponin T High Sensitivity: 18 ng/L (ref 0–19)

## 2024-07-11 LAB — PHOSPHORUS: Phosphorus: 2.3 mg/dL — ABNORMAL LOW (ref 2.5–4.6)

## 2024-07-11 LAB — LACTIC ACID, PLASMA: Lactic Acid, Venous: 1.5 mmol/L (ref 0.5–1.9)

## 2024-07-11 LAB — MRSA NEXT GEN BY PCR, NASAL: MRSA by PCR Next Gen: NOT DETECTED

## 2024-07-11 LAB — MAGNESIUM: Magnesium: 1.9 mg/dL (ref 1.7–2.4)

## 2024-07-11 LAB — PRO BRAIN NATRIURETIC PEPTIDE: Pro Brain Natriuretic Peptide: 866 pg/mL — ABNORMAL HIGH (ref <300.0)

## 2024-07-11 MED ORDER — SODIUM CHLORIDE 0.9 % IV SOLN
2.0000 g | INTRAVENOUS | Status: DC
  Administered 2024-07-11 – 2024-07-12 (×2): 2 g via INTRAVENOUS
  Filled 2024-07-11 (×2): qty 20

## 2024-07-11 MED ORDER — PROPRANOLOL HCL ER 60 MG PO CP24
120.0000 mg | ORAL_CAPSULE | Freq: Every day | ORAL | Status: DC
  Administered 2024-07-11 – 2024-07-12 (×2): 120 mg via ORAL
  Filled 2024-07-11 (×2): qty 1
  Filled 2024-07-11: qty 2

## 2024-07-11 MED ORDER — HEPARIN SODIUM (PORCINE) 5000 UNIT/ML IJ SOLN: 60.0000 [IU]/kg | Freq: Once | INTRAMUSCULAR | Status: DC

## 2024-07-11 MED ORDER — APIXABAN 5 MG PO TABS
10.0000 mg | ORAL_TABLET | Freq: Two times a day (BID) | ORAL | Status: DC
  Administered 2024-07-11 – 2024-07-12 (×3): 10 mg via ORAL
  Filled 2024-07-11 (×3): qty 2

## 2024-07-11 MED ORDER — POTASSIUM CHLORIDE CRYS ER 20 MEQ PO TBCR
40.0000 meq | EXTENDED_RELEASE_TABLET | Freq: Once | ORAL | Status: AC
  Administered 2024-07-11: 18:00:00 40 meq via ORAL
  Filled 2024-07-11: qty 2

## 2024-07-11 MED ORDER — POLYETHYLENE GLYCOL 3350 17 G PO PACK: 17.0000 g | PACK | Freq: Every day | ORAL | Status: DC | PRN

## 2024-07-11 MED ORDER — PROCHLORPERAZINE EDISYLATE 10 MG/2ML IJ SOLN: 5.0000 mg | Freq: Four times a day (QID) | INTRAMUSCULAR | Status: DC | PRN

## 2024-07-11 MED ORDER — ACETAMINOPHEN 500 MG PO TABS: 500.0000 mg | ORAL_TABLET | Freq: Four times a day (QID) | ORAL | Status: DC | PRN

## 2024-07-11 MED ORDER — HYDROMORPHONE HCL 1 MG/ML IJ SOLN
0.5000 mg | Freq: Once | INTRAMUSCULAR | Status: AC
  Administered 2024-07-11: 03:00:00 0.5 mg via INTRAVENOUS
  Filled 2024-07-11: qty 0.5

## 2024-07-11 MED ORDER — HYDROMORPHONE HCL 1 MG/ML IJ SOLN
0.5000 mg | INTRAMUSCULAR | Status: DC | PRN
  Administered 2024-07-11 (×4): 0.5 mg via INTRAVENOUS
  Filled 2024-07-11 (×4): qty 0.5

## 2024-07-11 MED ORDER — MAGNESIUM OXIDE -MG SUPPLEMENT 400 (240 MG) MG PO TABS
400.0000 mg | ORAL_TABLET | Freq: Once | ORAL | Status: AC
  Administered 2024-07-11: 18:00:00 400 mg via ORAL
  Filled 2024-07-11: qty 1

## 2024-07-11 MED ORDER — PANTOPRAZOLE SODIUM 40 MG IV SOLR
40.0000 mg | Freq: Every day | INTRAVENOUS | Status: DC
  Administered 2024-07-11 – 2024-07-12 (×2): 40 mg via INTRAVENOUS
  Filled 2024-07-11 (×2): qty 10

## 2024-07-11 MED ORDER — PREGABALIN 50 MG PO CAPS
100.0000 mg | ORAL_CAPSULE | Freq: Every day | ORAL | Status: DC
  Administered 2024-07-11 – 2024-07-12 (×2): 100 mg via ORAL
  Filled 2024-07-11 (×2): qty 2

## 2024-07-11 MED ORDER — MELATONIN 3 MG PO TABS: 6.0000 mg | ORAL_TABLET | Freq: Every evening | ORAL | Status: DC | PRN

## 2024-07-11 MED ORDER — APIXABAN 5 MG PO TABS: 5.0000 mg | ORAL_TABLET | Freq: Two times a day (BID) | ORAL | Status: DC

## 2024-07-11 MED ORDER — METHADONE HCL 10 MG PO TABS
20.0000 mg | ORAL_TABLET | Freq: Two times a day (BID) | ORAL | Status: DC
  Administered 2024-07-11 – 2024-07-12 (×2): 20 mg via ORAL
  Filled 2024-07-11 (×2): qty 2

## 2024-07-11 MED ORDER — TAMSULOSIN HCL 0.4 MG PO CAPS
0.4000 mg | ORAL_CAPSULE | Freq: Every day | ORAL | Status: DC
  Administered 2024-07-11 – 2024-07-12 (×2): 0.4 mg via ORAL
  Filled 2024-07-11 (×2): qty 1

## 2024-07-11 MED ORDER — TRAZODONE HCL 50 MG PO TABS
100.0000 mg | ORAL_TABLET | Freq: Every day | ORAL | Status: DC
  Administered 2024-07-11: 23:00:00 100 mg via ORAL
  Filled 2024-07-11: qty 2

## 2024-07-11 MED ORDER — OXYCODONE HCL 5 MG PO TABS
5.0000 mg | ORAL_TABLET | ORAL | Status: DC | PRN
  Administered 2024-07-12: 10 mg via ORAL
  Administered 2024-07-12: 5 mg via ORAL
  Administered 2024-07-12: 10 mg via ORAL
  Filled 2024-07-11 (×2): qty 2
  Filled 2024-07-11: qty 1

## 2024-07-11 MED ORDER — LACTATED RINGERS IV BOLUS
1000.0000 mL | Freq: Once | INTRAVENOUS | Status: AC
  Administered 2024-07-11: 02:00:00 1000 mL via INTRAVENOUS

## 2024-07-11 MED ORDER — HEPARIN SODIUM (PORCINE) 5000 UNIT/ML IJ SOLN
60.0000 [IU]/kg | Freq: Once | INTRAMUSCULAR | Status: AC
  Administered 2024-07-11: 04:00:00 3800 [IU] via INTRAVENOUS

## 2024-07-11 MED ORDER — IOHEXOL 350 MG/ML SOLN
80.0000 mL | Freq: Once | INTRAVENOUS | Status: AC | PRN
  Administered 2024-07-11: 03:00:00 80 mL via INTRAVENOUS

## 2024-07-11 MED ORDER — HEPARIN (PORCINE) 25000 UT/250ML-% IV SOLN
1050.0000 [IU]/h | INTRAVENOUS | Status: DC
  Administered 2024-07-11: 04:00:00 1050 [IU]/h via INTRAVENOUS
  Filled 2024-07-11: qty 250

## 2024-07-11 MED ORDER — LEVOTHYROXINE SODIUM 100 MCG PO TABS
100.0000 ug | ORAL_TABLET | Freq: Every day | ORAL | Status: DC
  Administered 2024-07-12: 100 ug via ORAL
  Filled 2024-07-11: qty 1

## 2024-07-11 MED ADMIN — Acetaminophen Tab 500 MG: 1000 mg | ORAL | @ 01:00:00 | NDC 00904673061

## 2024-07-11 MED FILL — Acetaminophen Tab 500 MG: 1000.0000 mg | ORAL | Qty: 2 | Status: AC

## 2024-07-11 NOTE — ED Notes (Signed)
Pt placed on 2 L. 

## 2024-07-11 NOTE — ED Notes (Signed)
Report given to Leonard RN

## 2024-07-11 NOTE — Plan of Care (Signed)

## 2024-07-11 NOTE — Progress Notes (Signed)
°   07/11/24 1131  TOC Brief Assessment  Insurance and Status Reviewed  Patient has primary care physician Yes  Home environment has been reviewed From Home  Prior level of function: Independent  Prior/Current Home Services No current home services  Social Drivers of Health Review SDOH reviewed no interventions necessary  Readmission risk has been reviewed Yes  Transition of care needs transition of care needs identified, TOC will continue to follow   ICM will follow for oxygen needs.

## 2024-07-11 NOTE — ED Triage Notes (Addendum)
 Pt to ED via EMS from home with c/o hip pain and sob, EMS reports first call out was for hip pain, upon arrival pt asked EMS if she would be admitted and put on continuous pain drip and wanted dilaudid  the same as she had yesterday at [facility] ED- EMS said probably not, pt then refused to go to ED, pt called EMS for second time for sob and said she didn't want to go back to [facility] ED. EMS report pt has oxycodone  at home that she took at 2100, family informed EMS pt has hx of opioid abuse and family could pick pt when and if discharged. Pt is ambulatory with walker per EMS

## 2024-07-11 NOTE — Telephone Encounter (Signed)
 Pharmacy Patient Advocate Encounter  Insurance verification completed.    The patient is insured through [employer] and [employer] [place]. Patient has Medicare and is not eligible for a copay card, but may be able to apply for patient assistance or Medicare RX Payment Plan (Patient Must reach out to their plan, if eligible for payment plan), if available.    Ran test claim for Eliquis  5mg  tablet and the current 30 day co-pay is $0.   This test claim was processed through [employer]- copay amounts may vary at other pharmacies due to [employer], or as the patient moves through the different stages of their insurance plan.

## 2024-07-11 NOTE — ED Provider Notes (Signed)
  EMERGENCY DEPARTMENT AT [facility] Provider Note   CSN: [id number] Arrival date & time: 07/11/24  9962     Patient presents with: Hip Pain (Sob/)   Christine Burns is a 78 y.o. female.   78 year old female with evaluation of hip pain and shortness of breath.  She states she was discharged yesterday after being admitted to outside hospital for neck surgery.  She states that she had a fall today after being discharged home.  She is on oxycodone  at home and does not walk with a walker.  States she is having right-sided hip pain.  Per EMS reports she initially called out for hip pain but then later called out again for shortness of breath.  States she also feels shortness of breath that started as well.  Denies cough or fever.  Denies any other symptoms or concerns at this time, she is a somewhat poor historian.   Hip Pain Associated symptoms include shortness of breath. Pertinent negatives include no chest pain and no abdominal pain.       Prior to Admission medications  Medication Sig Start Date End Date Taking? Authorizing Provider  amLODipine  (NORVASC ) 2.5 MG tablet Take 2.5 mg by mouth daily. 12/30/20   [provider]  Cyanocobalamin  (B-12 PO) Take 1 tablet by mouth daily.    [provider]  Ferrous Sulfate (IRON PO) Take 1 tablet by mouth daily.    [provider]  fluticasone  (FLONASE ) 50 MCG/ACT nasal spray Place 1-2 sprays into both nostrils daily as needed for allergies. 07/30/20   [provider]  levothyroxine  (SYNTHROID , LEVOTHROID) 100 MCG tablet Take 100 mcg by mouth daily. 09/04/15   [provider]  loratadine  (CLARITIN ) 10 MG tablet Take 10 mg by mouth daily as needed for allergies.     [provider]  methadone  (DOLOPHINE ) 10 MG tablet Take 20 mg by mouth 2 (two) times daily. 07/28/20   [provider]  MOVANTIK 12.5 MG TABS tablet Take 12.5 mg by mouth daily. 09/04/23   [provider]  ondansetron  (ZOFRAN ) 4 MG tablet Take 1 tablet (4 mg total) by mouth every 6 (six) hours as needed for nausea. 04/09/21   Swinteck, Redell, MD  pantoprazole  (PROTONIX ) 40 MG tablet Take 1 tablet (40 mg total) by mouth daily. 01/22/24   Ahmed, Deatrice FALCON, MD  pregabalin  (LYRICA ) 100 MG capsule Take 100 mg by mouth daily. 03/27/21   [provider]  propranolol  ER (INDERAL  LA) 120 MG 24 hr capsule Take 120 mg by mouth daily. 01/16/21   [provider]  traZODone  (DESYREL ) 100 MG tablet Take 100 mg by mouth at bedtime. 06/29/06   [provider]    Allergies: Fentanyl , Ibuprofen, Zolpidem , Cyclobenzaprine , Diazepam , Morphine and codeine, Zolpidem  tartrate, Metaxalone, Meperidine, and Morphine    Review of Systems  Constitutional:  Negative for chills and fever.  HENT:  Negative for ear pain and sore throat.   Eyes:  Negative for pain and visual disturbance.  Respiratory:  Positive for shortness of breath. Negative for cough.   Cardiovascular:  Negative for chest pain and palpitations.  Gastrointestinal:  Negative for abdominal pain and vomiting.  Genitourinary:  Negative for dysuria and hematuria.  Musculoskeletal:  Negative for arthralgias and back pain.       Admits right hip pain  Skin:  Negative for color change and rash.  Neurological:  Negative for seizures and syncope.  All other systems reviewed and are negative.  Updated Vital Signs BP (!) 142/82 (BP Location: Left Arm)   Pulse 98   Temp 98.6 F (37 C) (Oral)   Resp 18   Ht 5' 2 (1.575 m)   Wt 63.5 kg   SpO2 90%   BMI 25.62 kg/m   Physical Exam Vitals and nursing note reviewed.  Constitutional:      General: She is not in acute distress.    Appearance: Normal appearance. She is well-developed. She is not ill-appearing.  HENT:     Head: Normocephalic and atraumatic.  Eyes:     Conjunctiva/sclera: Conjunctivae normal.  Cardiovascular:     Rate and Rhythm: Regular rhythm. Tachycardia  present.     Heart sounds: No murmur heard. Pulmonary:     Effort: Pulmonary effort is normal. No respiratory distress.     Breath sounds: Normal breath sounds.  Abdominal:     Palpations: Abdomen is soft.     Tenderness: There is no abdominal tenderness.  Musculoskeletal:        General: No swelling.     Cervical back: Neck supple.     Comments: Right hip tenderness to palpation, 2+ pedal pulses, limited range of motion of hip and knee due to pain  Skin:    General: Skin is warm and dry.     Capillary Refill: Capillary refill takes less than 2 seconds.  Neurological:     Mental Status: She is alert.  Psychiatric:        Mood and Affect: Mood normal.     (all labs ordered are listed, but only abnormal results are displayed) Labs Reviewed  BASIC METABOLIC PANEL WITH GFR - Abnormal; Notable for the following components:      Result Value   Chloride 96 (*)    CO2 35 (*)    Glucose, Bld 142 (*)    All other components within normal limits  CBC WITH DIFFERENTIAL/PLATELET - Abnormal; Notable for the following components:   WBC 14.6 (*)    RBC 3.78 (*)    Hemoglobin 11.6 (*)    HCT 35.2 (*)    Neutro Abs 12.5 (*)    Monocytes Absolute 1.2 (*)    All other components within normal limits  PRO BRAIN NATRIURETIC PEPTIDE - Abnormal; Notable for the following components:   Pro Brain Natriuretic Peptide 866.0 (*)    All other components within normal limits  URINALYSIS, ROUTINE W REFLEX MICROSCOPIC - Abnormal; Notable for the following components:   Hgb urine dipstick MODERATE (*)    Ketones, ur 5 (*)    Protein, ur 30 (*)    Leukocytes,Ua SMALL (*)    Non Squamous Epithelial 0-5 (*)    All other components within normal limits  RESP PANEL BY RT-PCR (RSV, FLU A&B, COVID)  RVPGX2  CULTURE, BLOOD (ROUTINE X 2)  CULTURE, BLOOD (ROUTINE X 2)  URINE CULTURE  MRSA NEXT GEN BY PCR, NASAL  LACTIC ACID, PLASMA  HEPARIN  LEVEL (UNFRACTIONATED)  CBC WITH DIFFERENTIAL/PLATELET  BASIC  METABOLIC PANEL WITH GFR  MAGNESIUM   PHOSPHORUS  TROPONIN T, HIGH SENSITIVITY  TROPONIN T, HIGH SENSITIVITY    EKG: EKG Interpretation Date/Time:  Thursday July 11 2024 00:52:51 EST Ventricular Rate:  116 PR Interval:  157 QRS Duration:  88 QT Interval:  329 QTC Calculation: 457 R Axis:   21  Text Interpretation: Sinus tachycardia Compared with prior EKG from 03/04/2024 Confirmed by Gennaro Bouchard ([id number]) on 07/11/2024 12:58:04 AM  Radiology:    .Critical Care  Performed  by: Gennaro Duwaine CROME, DO Authorized by: Gennaro Duwaine CROME, DO   Critical care provider statement:    Critical care time (minutes):  40   Critical care time was exclusive of:  Separately billable procedures and treating other patients and teaching time   Critical care was necessary to treat or prevent imminent or life-threatening deterioration of the following conditions:  Respiratory failure   Critical care was time spent personally by me on the following activities:  Development of treatment plan with patient or surrogate, discussions with consultants, evaluation of patient's response to treatment, examination of patient, ordering and review of laboratory studies, ordering and review of radiographic studies, ordering and performing treatments and interventions, pulse oximetry, re-evaluation of patient's condition and review of old charts   Care discussed with: admitting provider      Medications Ordered in the ED  heparin  ADULT infusion 100 units/mL (25000 units/250mL) (1,050 Units/hr Intravenous Infusion Verify 07/11/24 0541)  acetaminophen  (TYLENOL ) tablet 500 mg (has no administration in time range)  prochlorperazine  (COMPAZINE ) injection 5 mg (has no administration in time range)  polyethylene glycol (MIRALAX  / GLYCOLAX ) packet 17 g (has no administration in time range)  melatonin tablet 6 mg (has no administration in time range)  oxyCODONE  (Oxy IR/ROXICODONE ) immediate release tablet 5-10 mg (has  no administration in time range)  HYDROmorphone  (DILAUDID ) injection 0.5 mg (has no administration in time range)  pantoprazole  (PROTONIX ) injection 40 mg (has no administration in time range)  cefTRIAXone  (ROCEPHIN ) 2 g in sodium chloride  0.9 % 100 mL IVPB (has no administration in time range)  acetaminophen  (TYLENOL ) tablet 1,000 mg (1,000 mg Oral Given 07/11/24 0113)  lactated ringers  bolus 1,000 mL (0 mLs Intravenous Stopped 07/11/24 0312)  iohexol  (OMNIPAQUE ) 350 MG/ML injection 80 mL (80 mLs Intravenous Contrast Given 07/11/24 0253)  HYDROmorphone  (DILAUDID ) injection 0.5 mg (0.5 mg Intravenous Given 07/11/24 0329)  heparin  injection 3,800 Units (3,800 Units Intravenous Given 07/11/24 0348)                                    Medical Decision Making Multiple people physical Tachycardia, no ectopy  Patient here for fall and hip pain as well as shortness of breath.  She is not tachypneic but hypoxic on room air at 83% and tachycardic.  She did just recently have a spinal fusion.  Spinal fusion.  No fracture on scan but she is noted to be mildly febrile as well.  Found to have a PE on CT scan.  No evidence of pneumonia, infection or UTI.  I think fever is likely from PE at this point.  Antibiotics were not started at this time but blood cultures are pending.  I think her symptoms are likely all from PE.  She does not want to return to [facility] who recently had her surgery.  Was started on a heparin  drip as well.  Discussed patient's case with hospitalist and patient will be admitted for further workup and management.  Patient is agreeable with the plan.  Problems Addressed: Acute hypoxic respiratory failure (HCC): acute illness or injury that poses a threat to life or bodily functions Acute pulmonary embolism without acute cor pulmonale, unspecified pulmonary embolism type [facility]): acute illness or injury that poses a threat to life or bodily functions Fall, initial encounter: acute illness  or injury Fever, unspecified fever cause: acute illness or injury Right hip pain: acute illness or injury  Amount and/or Complexity of Data Reviewed External Data Reviewed: notes.    Details: Prior hospital records reviewed patient with recent spinal fusion at outside hospital Labs: ordered. Decision-making details documented in ED Course.    Details: Ordered and reviewed by me and shows a slight leukocytosis Radiology: ordered and independent interpretation performed. Decision-making details documented in ED Course.    Details: Ordered and interpreted by me independently of radiology Chest x-ray: Shows no acute abnormality CT PE study: Evidence of right-sided PE with no heart strain Right hip x-ray: Shows no acute fracture or abnormality Right hip CT: Shows no acute abnormality ECG/medicine tests: ordered and independent interpretation performed. Decision-making details documented in ED Course.    Details: Ordered and interpreted by me in the absence of cardiology and shows sinus tachycardia, no STEMI, or significant change when compared to prior EKG Discussion of management or test interpretation with external provider(s): Dr. Benay Sermon with medical regarding patient's case and she will admit patient to their service for further workup and management  Risk OTC drugs. Prescription drug management. Parenteral controlled substances. Drug therapy requiring intensive monitoring for toxicity. Decision regarding hospitalization. Risk Details: CRITICAL CARE Performed by: Duwaine LITTIE Fusi   Total critical care time: 40 minutes  Critical care time was exclusive of separately billable procedures and treating other patients.  Critical care was necessary to treat or prevent imminent or life-threatening deterioration.  Critical care was time spent personally by me on the following activities: development of treatment plan with patient and/or surrogate as well as nursing,  discussions with consultants, evaluation of patient's response to treatment, examination of patient, obtaining history from patient or surrogate, ordering and performing treatments and interventions, ordering and review of laboratory studies, ordering and review of radiographic studies, pulse oximetry and re-evaluation of patient's condition.   Critical Care Total time providing critical care: 40 minutes    Final diagnoses:  Acute hypoxic respiratory failure (HCC)  Fever, unspecified fever cause  Fall, initial encounter  Right hip pain  Acute pulmonary embolism without acute cor pulmonale, unspecified pulmonary embolism type [facility])    ED Discharge Orders     None          Fusi Duwaine LITTIE, DO 07/11/24 0543

## 2024-07-11 NOTE — Discharge Instructions (Signed)
 Information on my medicine - ELIQUIS  (apixaban )  Why was Eliquis  prescribed for you? Eliquis  was prescribed to treat blood clots that may have been found in the veins of your legs (deep vein thrombosis) or in your lungs (pulmonary embolism) and to reduce the risk of them occurring again.  What do You need to know about Eliquis  ? The starting dose is 10 mg (two 5 mg tablets) taken TWICE daily for the FIRST SEVEN (7) DAYS, then on 07/18/24  the dose is reduced to ONE 5 mg tablet taken TWICE daily.  Eliquis  may be taken with or without food.   Try to take the dose about the same time in the morning and in the evening. If you have difficulty swallowing the tablet whole please discuss with your pharmacist how to take the medication safely.  Take Eliquis  exactly as prescribed and DO NOT stop taking Eliquis  without talking to the doctor who prescribed the medication.  Stopping may increase your risk of developing a new blood clot.  Refill your prescription before you run out.  After discharge, you should have regular check-up appointments with your healthcare provider that is prescribing your Eliquis .    What do you do if you miss a dose? If a dose of ELIQUIS  is not taken at the scheduled time, take it as soon as possible on the same day and twice-daily administration should be resumed. The dose should not be doubled to make up for a missed dose.  Important Safety Information A possible side effect of Eliquis  is bleeding. You should call your healthcare provider right away if you experience any of the following: Bleeding from an injury or your nose that does not stop. Unusual colored urine (red or dark brown) or unusual colored stools (red or black). Unusual bruising for unknown reasons. A serious fall or if you hit your head (even if there is no bleeding).  Some medicines may interact with Eliquis  and might increase your risk of bleeding or clotting while on Eliquis . To help avoid this,  consult your healthcare provider or pharmacist prior to using any new prescription or non-prescription medications, including herbals, vitamins, non-steroidal anti-inflammatory drugs (NSAIDs) and supplements.  This website has more information on Eliquis  (apixaban ): http://[email or web address] .com/eliquis dena

## 2024-07-11 NOTE — Progress Notes (Signed)
 PHARMACY - ANTICOAGULATION CONSULT NOTE  Pharmacy Consult for heparin  Indication: PE w/ no RHS  Allergies[1]  Patient Measurements: Height: 5' 2 (157.5 cm) Weight: 63.5 kg (140 lb 1.3 oz) IBW/kg (Calculated) : 50.1 HEPARIN  DW (KG): 62.9  Vital Signs: Temp: 99.7 F (37.6 C) (12/18 0335) Temp Source: Oral (12/18 0335) BP: 145/69 (12/18 0333) Pulse Rate: 91 (12/18 0334)  Labs: Recent Labs    07/11/24 0115  HGB 11.6*  HCT 35.2*  PLT 171  CREATININE 0.96    Estimated Creatinine Clearance: 42.3 mL/min (by C-G formula based on SCr of 0.96 mg/dL).   Medical History: Past Medical History:  Diagnosis Date   Anemia    Anxiety    Arthritis    back & knees & fingers    Chronic kidney disease    due to increased use of NSAIDS in the past, appt. with [facility] x1, but now is managed by PCP- [facility]     Depression    Fibromyalgia    tremors   Hypertension    Hypothyroidism     Assessment: Christine Burns presented with c/o hip pain and shortness of breath. Pharmacy consulted to dose heparin  for pulmonary embolism.  -no PTA anticoagulation -Hgb 11.6, plts WNL -CT: acute PE in RLL and RUL; no RHS  Goal of Therapy:  Heparin  level 0.3-0.7 units/ml Monitor platelets by anticoagulation protocol: Yes   Plan:  Give 3800 units bolus x 1 Start heparin  infusion at 1050 units/hr Check anti-Xa level in 8 hours and daily while on heparin  Continue to monitor H&H and platelets  Lynwood Poplar, PharmD, BCPS Clinical Pharmacist 07/11/2024 3:46 AM        [1]  Allergies Allergen Reactions   Fentanyl  Hypertension   Ibuprofen Other (See Comments)    Kidney damage   Zolpidem  Nausea And Vomiting and Other (See Comments)    Other reaction(s): Confusion (intolerance) made me go crazy made me go crazy made me go crazy made me go crazy Other Reaction: Other reaction    Cyclobenzaprine  Other (See Comments)    Other reaction(s): Confusion (intolerance), Hallucinations, Other (See  Comments) States goes crazy Cognitive changes. States goes crazy States goes crazy States goes crazy Cognitive changes.    Diazepam  Other (See Comments)      SLEEP WALKING, hallucinations   Morphine And Codeine Rash    Other reaction(s): Other (see comments) Headache Headache Other Reaction: Other reaction    Zolpidem  Tartrate Nausea And Vomiting   Metaxalone    Meperidine Nausea Only   Morphine Rash

## 2024-07-11 NOTE — H&P (Addendum)
 History and Physical  Christine Burns FMW:[id number] DOB: 12-Jun-1946 DOA: 07/11/2024  Referring physician: Dr. Gennaro, EDP  PCP: [name], MD  Outpatient Specialists: Orthopedic surgery at [facility] Patient coming from: Home.  Chief Complaint: Fall and shortness of breath.  HPI: Christine Burns is a 78 y.o. female with medical history significant for hypertension, hypothyroidism, chronic pain syndrome, fibromyalgia, recent neck surgery at [facility] (07/08/24), status post cervical spine fusion, who initially presented at [facility] on 07/10/2024, due to falling out of bed and having neck and right hip pain.  She had imaging done there that were negative for acute fracture or misalignment.  She was discharged home from the ER.  Home health had already been arranged from orthopedic surgery at discharge on 07/09/2024, as she did not meet criteria for SNF.  She presents today to the ER at [facility] via EMS with complaint of hip pain and shortness of breath.  She does not want to return to [facility].  In the ER, febrile with Tmax 100.9, tachycardic with hypoxia with O2 saturation in the 80s on room air.  Chest x-ray was unrevealing.  Subsequently, the patient had a CT angio chest which was positive for acute pulmonary embolism.  It showed small volume acute pulmonary emboli in the right lower lobe and right upper lobe.  No CT evidence of right heart strain.  13 mm solid pulmonary nodule versus intrapulmonary lymph node in the right lower lobe, with recommendation to consider noncontrast chest CT at 3 months, PET/CT or tissue sampling as per Fleischner Society guidelines.  Circumferential mural thickening involving the proximal and mid esophagus, nonspecific and may reflect esophagitis and can be further assessed with endoscopy.  Patient received heparin  drip in the ER.  TRH, hospitalist service, was asked to admit.  ED Course: Temperature 100.9.  BP 140.  65, pulse 88, respiratory 14, O2  saturation 94% on 2 L.  Review of Systems: Review of systems as noted in the HPI. All other systems reviewed and are negative.   Past Medical History:  Diagnosis Date   Anemia    Anxiety    Arthritis    back & knees & fingers    Chronic kidney disease    due to increased use of NSAIDS in the past, appt. with [facility] x1, but now is managed by PCP- N[facility]     Depression    Fibromyalgia    tremors   Hypertension    Hypothyroidism    Past Surgical History:  Procedure Laterality Date   ABDOMINAL HYSTERECTOMY     APPENDECTOMY     BACK SURGERY     cerv. fusion, low back surgery for stenosis    BREAST REDUCTION SURGERY Bilateral 1990's   CATARACT EXTRACTION W/PHACO Left 05/06/2024   Procedure: PHACOEMULSIFICATION, CATARACT, WITH IOL INSERTION;  Surgeon: Harrie Agent, MD;  Location: [facility]S;  Service: Ophthalmology;  Laterality: Left;  CDE 5.58   KNEE ARTHROPLASTY Left 04/07/2021   Procedure: COMPUTER ASSISTED TOTAL KNEE ARTHROPLASTY;  Surgeon: Fidel Rogue, MD;  Location: [facility];  Service: Orthopedics;  Laterality: Left;    Social History:  reports that she has never smoked. She has never used smokeless tobacco. She reports that she does not drink alcohol  and does not use drugs.   Allergies[1]  Family history: None reported.  Prior to Admission medications  Medication Sig Start Date End Date Taking? Authorizing Provider  amLODipine  (NORVASC ) 2.5 MG tablet Take 2.5 mg by mouth daily. 12/30/20  [provider]  Cyanocobalamin  (B-12 PO) Take 1 tablet by mouth daily.    [provider]  Ferrous Sulfate (IRON PO) Take 1 tablet by mouth daily.    [provider]  fluticasone  (FLONASE ) 50 MCG/ACT nasal spray Place 1-2 sprays into both nostrils daily as needed for allergies. 07/30/20   [provider]  levothyroxine  (SYNTHROID , LEVOTHROID) 100 MCG tablet Take 100 mcg by mouth daily. 09/04/15   [provider]  loratadine  (CLARITIN ) 10 MG tablet  Take 10 mg by mouth daily as needed for allergies.     [provider]  methadone  (DOLOPHINE ) 10 MG tablet Take 20 mg by mouth 2 (two) times daily. 07/28/20   [provider]  MOVANTIK 12.5 MG TABS tablet Take 12.5 mg by mouth daily. 09/04/23   [provider]  ondansetron  (ZOFRAN ) 4 MG tablet Take 1 tablet (4 mg total) by mouth every 6 (six) hours as needed for nausea. 04/09/21   Swinteck, Redell, MD  pantoprazole  (PROTONIX ) 40 MG tablet Take 1 tablet (40 mg total) by mouth daily. 01/22/24   Ahmed, Deatrice FALCON, MD  pregabalin  (LYRICA ) 100 MG capsule Take 100 mg by mouth daily. 03/27/21   [provider]  propranolol  ER (INDERAL  LA) 120 MG 24 hr capsule Take 120 mg by mouth daily. 01/16/21   [provider]  traZODone  (DESYREL ) 100 MG tablet Take 100 mg by mouth at bedtime. 06/29/06   [provider]    Physical Exam: BP (!) 144/73   Pulse 90   Temp 99.7 F (37.6 C) (Oral)   Resp 13   Ht 5' 2 (1.575 m)   Wt 63.5 kg   SpO2 94%   BMI 25.62 kg/m   General: 78 y.o. year-old female well developed well nourished in no acute distress.  Alert and oriented x3. Cardiovascular: Regular rate and rhythm with no rubs or gallops.  No thyromegaly or JVD noted.  Trace bilateral lower extremity edema, left greater than right. Respiratory: Clear to auscultation with no wheezes or rales. Good inspiratory effort. Abdomen: Soft nontender nondistended with normal bowel sounds x4 quadrants. Muskuloskeletal: No cyanosis, clubbing or edema noted bilaterally Neuro: CN II-XII intact, strength, sensation, reflexes Skin: No ulcerative lesions noted or rashes Psychiatry: Judgement and insight appear normal. Mood is appropriate for condition and setting          Labs on Admission:  Basic Metabolic Panel: Recent Labs  Lab 07/11/24 0115  NA 136  K 3.8  CL 96*  CO2 35*  GLUCOSE 142*  BUN 14  CREATININE 0.96  CALCIUM 8.9   Liver Function Tests: No results for  input(s): AST, ALT, ALKPHOS, BILITOT, PROT, ALBUMIN  in the last 168 hours. No results for input(s): LIPASE, AMYLASE in the last 168 hours. No results for input(s): AMMONIA in the last 168 hours. CBC: Recent Labs  Lab 07/11/24 0115  WBC 14.6*  NEUTROABS 12.5*  HGB 11.6*  HCT 35.2*  MCV 93.1  PLT 171   Cardiac Enzymes: No results for input(s): CKTOTAL, CKMB, CKMBINDEX, TROPONINI in the last 168 hours.  BNP (last 3 results) No results for input(s): BNP in the last 8760 hours.  ProBNP (last 3 results) Recent Labs    07/11/24 0115  PROBNP 866.0*    CBG: No results for input(s): GLUCAP in the last 168 hours.  Radiological Exams on Admission:   EKG: I independently viewed the EKG done and my findings are as followed: Sinus tachycardia rate of 116.  QTc  457.  Assessment/Plan Present on Admission:  Acute pulmonary embolism (HCC)  Principal Problem:   Acute pulmonary embolism (HCC)  Acute pulmonary embolism with no evidence of right heart strain on CT scan High-sensitivity troponin 19, 18 proBNP 866 Continue heparin  drip Switch to DOAC in the morning. Maintain O2 saturation above 92%.  Acute hypoxic respiratory failure secondary to the above Not on oxygen supplementation at baseline O2 saturation in the 80s on room air Continue oxygen supplementation and wean off as tolerated.  Left lower extremity edema with pain Rule out DVT Follow lower extremity duplex ultrasound  Chronic pain syndrome Fibromyalgia Resume home regimen.  Recent cervical spine fusion 07/08/2024, at [facility] Patient refused to return to [facility] Recommend follow-up with orthopedic surgeon after discharge. Imaging done on 07/10/2024 were nonacute. Pain control as needed  SIRS Leukocytosis, possibly reactive Febrile with Tmax 100.9 Covering with Rocephin  until active infective process can be ruled out Chest x-ray was nonacute.  Follow UA Follow peripheral  blood cultures x 2, MRSA screening test, and urine culture. Lactic acid was reassuring 1.5.  Possible esophagitis seen on CT scan IV PPI daily Consider GI follow-up for possible endoscopy.   Time: 75 minutes.   DVT prophylaxis: Heparin  drip.  Code Status: Full code.  Family Communication: None at bedside.  Disposition Plan: Admitted to telemetry unit.  Consults called: None.  Admission status: Observation status.   Status is: Observation    Terry LOISE Hurst MD [facility] Pager [phone]  If 7PM-7AM, please contact night-coverage [email or web address] Password [email or web address]  07/11/2024, 4:02 AM      [1]  Allergies Allergen Reactions   Fentanyl  Hypertension   Ibuprofen Other (See Comments)    Kidney damage   Zolpidem  Nausea And Vomiting and Other (See Comments)    Other reaction(s): Confusion (intolerance) made me go crazy made me go crazy made me go crazy made me go crazy Other Reaction: Other reaction    Cyclobenzaprine  Other (See Comments)    Other reaction(s): Confusion (intolerance), Hallucinations, Other (See Comments) States goes crazy Cognitive changes. States goes crazy States goes crazy States goes crazy Cognitive changes.    Diazepam  Other (See Comments)      SLEEP WALKING, hallucinations   Morphine And Codeine Rash    Other reaction(s): Other (see comments) Headache Headache Other Reaction: Other reaction    Zolpidem  Tartrate Nausea And Vomiting   Metaxalone    Meperidine Nausea Only   Morphine Rash

## 2024-07-12 DIAGNOSIS — J9601 Acute respiratory failure with hypoxia: Secondary | ICD-10-CM

## 2024-07-12 DIAGNOSIS — N3000 Acute cystitis without hematuria: Secondary | ICD-10-CM | POA: Diagnosis not present

## 2024-07-12 DIAGNOSIS — R338 Other retention of urine: Secondary | ICD-10-CM

## 2024-07-12 DIAGNOSIS — I2699 Other pulmonary embolism without acute cor pulmonale: Secondary | ICD-10-CM | POA: Diagnosis not present

## 2024-07-12 LAB — CBC
HCT: 29.8 % — ABNORMAL LOW (ref 36.0–46.0)
Hemoglobin: 9.6 g/dL — ABNORMAL LOW (ref 12.0–15.0)
MCH: 30.7 pg (ref 26.0–34.0)
MCHC: 32.2 g/dL (ref 30.0–36.0)
MCV: 95.2 fL (ref 80.0–100.0)
Platelets: 187 K/uL (ref 150–400)
RBC: 3.13 MIL/uL — ABNORMAL LOW (ref 3.87–5.11)
RDW: 13.6 % (ref 11.5–15.5)
WBC: 9.9 K/uL (ref 4.0–10.5)
nRBC: 0 % (ref 0.0–0.2)

## 2024-07-12 LAB — URINE CULTURE: Culture: NO GROWTH

## 2024-07-12 MED ORDER — TAMSULOSIN HCL 0.4 MG PO CAPS: 0.4000 mg | ORAL_CAPSULE | Freq: Every day | ORAL | 3 refills | Status: AC

## 2024-07-12 MED ORDER — APIXABAN 5 MG PO TABS: ORAL_TABLET | ORAL | 0 refills | Status: AC

## 2024-07-12 MED ORDER — SULFAMETHOXAZOLE-TRIMETHOPRIM 800-160 MG PO TABS: 1.0000 | ORAL_TABLET | Freq: Two times a day (BID) | ORAL | 0 refills | Status: AC

## 2024-07-12 NOTE — Care Management CC44 (Signed)
"         Condition Code 44 Documentation Completed  Patient Details  Name: Christine Burns MRN: [id number] Date of Birth: 13-Feb-1946   Condition Code 44 given:  Yes Patient signature on Condition Code 44 notice:  Yes Documentation of 2 MD's agreement:  Yes Code 44 added to claim:  Yes    Noreen KATHEE Pinal, LCSWA 07/12/2024, 10:48 AM  "

## 2024-07-12 NOTE — Discharge Summary (Signed)
 " Physician Discharge Summary   Patient: Christine Burns MRN: [id number] DOB: 1946-07-10  Admit date:     07/11/2024  Discharge date: 07/12/2024  Discharge Physician: Carliss LELON Canales   PCP: [name], MD   Recommendations at discharge:    Pt to be discharged home.   If you experience worsening fever, chills, chest pain, shortness of breath, or other concerning symptoms, please call your PCP or go to the emergency department immediately.  Discharge Diagnoses: Principal Problem:   Acute pulmonary embolism (HCC)  Resolved Problems:   * No resolved hospital problems. *   Hospital Course:  78 y.o. female with medical history significant for hypertension, hypothyroidism, chronic pain syndrome, fibromyalgia, recent neck surgery at [facility] (07/08/24), status post cervical spine fusion, who initially presented at [facility] on 07/10/2024, due to falling out of bed and having neck and right hip pain.  She had imaging done there that were negative for acute fracture or misalignment.  She was discharged home from the ER.  Home health had already been arranged from orthopedic surgery at discharge on 07/09/2024, as she did not meet criteria for SNF.   She presents today to the ER at [facility] via EMS with complaint of hip pain and shortness of breath.  She does not want to return to [facility].   In the ER, febrile with Tmax 100.9, tachycardic with hypoxia with O2 saturation in the 80s on room air.  Chest x-ray was unrevealing.  Subsequently, the patient had a CT angio chest which was positive for acute pulmonary embolism.  It showed small volume acute pulmonary emboli in the right lower lobe and right upper lobe.  No CT evidence of right heart strain.  13 mm solid pulmonary nodule versus intrapulmonary lymph node in the right lower lobe, with recommendation to consider noncontrast chest CT at 3 months, PET/CT or tissue sampling as per Fleischner Society guidelines.  Circumferential mural  thickening involving the proximal and mid esophagus, nonspecific and may reflect esophagitis and can be further assessed with endoscopy.   Patient received heparin  drip in the ER.  TRH, hospitalist service, was asked to admit.   ED Course: Temperature 100.9.  BP 140.  65, pulse 88, respiratory 14, O2 saturation 94% on 2 L.  Assessment and Plan:  Acute hypoxic respiratory failure - Initially on chronic 2 L nasal cannula.  Able to be weaned down to room air with course hospital stay.  Acute pulmonary embolism-likely provoked - Patient with recent cervical surgery and minimal ambulation.  CT noting right lobe PE with no evidence of strain.  Initially requiring O2.  Was initiated on heparin  drip, subsequently transition to p.o. Eliquis .  O2 weaned off.  Lower extremity ultrasound negative for DVT.  Patient feeling well, and respiratory, eager for discharge home.  Will discharge patient with Eliquis  starter pack, Eliquis  10 mg p.o. twice daily x 7 days then transition to 5 mg twice daily thereafter.  Patient will be on anticoagulation for at least 3 months.  Can follow-up with PCP in the outpatient setting.  Acute urinary retention - Episode of urinary retention requiring In-N-Out catheterization.  Initiated on Flomax .  Appears to be resolved at this time.  Etiology likely secondary to possible UTI.  Possible urinary tract infection - UA noting LE, WBCs.  Initiated on empiric ceftriaxone .  Appears to be responding well.  Will transition patient to empiric p.o. Bactrim to take as directed upon discharge.  Chronic pain syndrome/fibromyalgia - Resume home  medication regimen.  Recent cervical spine fusion 12/15 at [facility] - Recommend patient follow-up in the outpatient setting.   Consultants: None Procedures performed: None Disposition: Home Diet recommendation:  Discharge Diet Orders (From admission, onward)     Start     Ordered   07/12/24 0000  Diet - low sodium heart healthy        07/12/24  1004           Cardiac diet  DISCHARGE MEDICATION: Allergies as of 07/12/2024       Reactions   Fentanyl  Hypertension   Ibuprofen Other (See Comments)   Kidney damage   Zolpidem  Nausea And Vomiting, Other (See Comments)   Other reaction(s): Confusion (intolerance) made me go crazy made me go crazy made me go crazy made me go crazy Other Reaction: Other reaction   Cyclobenzaprine  Other (See Comments)   Other reaction(s): Confusion (intolerance), Hallucinations, Other (See Comments) States goes crazy Cognitive changes. States goes crazy States goes crazy States goes crazy Cognitive changes.   Diazepam  Other (See Comments)     SLEEP WALKING, hallucinations   Morphine And Codeine Rash   Other reaction(s): Other (see comments) Headache Headache Other Reaction: Other reaction   Zolpidem  Tartrate Nausea And Vomiting   Metaxalone    Meperidine Nausea Only   Morphine Rash        Medication List     TAKE these medications    amLODipine  2.5 MG tablet Commonly known as: NORVASC  Take 2.5 mg by mouth daily.   apixaban  5 MG Tabs tablet Commonly known as: ELIQUIS  Take 2 tablets (10 mg total) by mouth 2 (two) times daily for 5 days, THEN 1 tablet (5 mg total) 2 (two) times daily. Start taking on: July 12, 2024   B-12 PO Take 1 tablet by mouth daily.   fluticasone  50 MCG/ACT nasal spray Commonly known as: FLONASE  Place 1-2 sprays into both nostrils daily as needed for allergies.   IRON PO Take 1 tablet by mouth daily.   levothyroxine  100 MCG tablet Commonly known as: SYNTHROID  Take 100 mcg by mouth daily.   loratadine  10 MG tablet Commonly known as: CLARITIN  Take 10 mg by mouth daily as needed for allergies.   methadone  10 MG tablet Commonly known as: DOLOPHINE  Take 20 mg by mouth 2 (two) times daily.   Movantik 12.5 MG Tabs tablet Generic drug: naloxegol oxalate Take 12.5 mg by mouth daily.   ondansetron  4 MG tablet Commonly known as:  ZOFRAN  Take 1 tablet (4 mg total) by mouth every 6 (six) hours as needed for nausea.   pantoprazole  40 MG tablet Commonly known as: PROTONIX  Take 1 tablet (40 mg total) by mouth daily.   pregabalin  100 MG capsule Commonly known as: LYRICA  Take 100 mg by mouth daily.   propranolol  ER 120 MG 24 hr capsule Commonly known as: INDERAL  LA Take 120 mg by mouth daily.   sulfamethoxazole-trimethoprim 800-160 MG tablet Commonly known as: Bactrim DS Take 1 tablet by mouth 2 (two) times daily.   tamsulosin  0.4 MG Caps capsule Commonly known as: FLOMAX  Take 1 capsule (0.4 mg total) by mouth daily. Start taking on: July 13, 2024   traZODone  100 MG tablet Commonly known as: DESYREL  Take 100 mg by mouth at bedtime.         Discharge Exam: Filed Weights   07/11/24 0049  Weight: 63.5 kg    GENERAL:  Alert, pleasant, no acute distress  HEENT:  EOMI, cervical collar CARDIOVASCULAR:  RRR, no  murmurs appreciated RESPIRATORY:  Clear to auscultation, no wheezing, rales, or rhonchi GASTROINTESTINAL:  Soft, nontender, nondistended EXTREMITIES:  No LE edema bilaterally NEURO:  No new focal deficits appreciated SKIN:  No rashes noted PSYCH:  Appropriate mood and affect     Condition at discharge: improving  The results of significant diagnostics from this hospitalization (including imaging, microbiology, ancillary and laboratory) are listed below for reference.   Imaging Studies: US  Venous Img Lower Unilateral Left (DVT) Result Date: 07/11/2024 CLINICAL DATA:  449891 Acute pulmonary embolism (HCC) 449891, right lower extremity pain after fall EXAM: RIGHT LOWER EXTREMITY VENOUS DOPPLER ULTRASOUND TECHNIQUE: Gray-scale sonography with graded compression, as well as color Doppler and duplex ultrasound were performed to evaluate the lower extremity deep venous systems from the level of the common femoral vein and including the common femoral, femoral, profunda femoral, popliteal and  calf veins including the posterior tibial, peroneal and gastrocnemius veins when visible. Spectral Doppler was utilized to evaluate flow at rest and with distal augmentation maneuvers in the common femoral, femoral and popliteal veins. COMPARISON:  None Available. FINDINGS: Contralateral Common Femoral Vein: Respiratory phasicity is normal and symmetric with the symptomatic side. No evidence of thrombus. Normal compressibility. Common Femoral Vein: No evidence of thrombus. Normal compressibility, respiratory phasicity and response to augmentation. Saphenofemoral Junction: No evidence of thrombus. Normal compressibility and flow on color Doppler imaging. Profunda Femoral Vein: No evidence of thrombus. Normal compressibility and flow on color Doppler imaging. Femoral Vein: No evidence of thrombus. Normal compressibility, respiratory phasicity and response to augmentation. Popliteal Vein: No evidence of thrombus. Normal compressibility, respiratory phasicity and response to augmentation. Calf Veins: No evidence of thrombus. Normal compressibility and flow on color Doppler imaging. IMPRESSION: No evidence of deep venous thrombosis. Electronically Signed   By: CHRISTELLA.  Shick M.D.   On: 07/11/2024 14:24   CT Angio Chest Pulmonary Embolism (PE) W or WO Contrast Addendum Date: 07/11/2024 ** ADDENDUM #1 ** ADDENDUM: I discussed these findings personally with Dr. remonia at 3:35 am- Electronically signed by: Dorethia Molt MD 07/11/2024 03:39 AM EST RP Workstation: [id number]   Result Date: 07/11/2024 ** ORIGINAL REPORT ** EXAM: CTA CHEST 07/11/2024 03:20:08 AM TECHNIQUE: CTA of the chest was performed without and with the administration of 80 mL of iohexol  (OMNIPAQUE ) 350 MG/ML injection. Multiplanar reformatted images are provided for review. MIP images are provided for review. Automated exposure control, iterative reconstruction, and/or weight based adjustment of the mA/kV was utilized to reduce the radiation dose to as  low as reasonably achievable. COMPARISON: None available. CLINICAL HISTORY: sob FINDINGS: PULMONARY ARTERIES: There is adequate opacification of the pulmonary arterial tree. There is branching and occlusive intraluminal filling defects within the medial and lateral basal segmental pulmonary arteries of the right lower lobe in keeping with small volume acute pulmonary emboli. Additional filling defect is seen within the anterior segment of the right upper lobe. The embolic burden is small. Central pulmonary arteries are of normal caliber. MEDIASTINUM: Global cardiac size within normal limits. No pericardial effusion. No CT evidence of right heart strain. No significant coronary artery calcifications. Mild atherosclerotic mixed plaque within the thoracic aorta. No aortic aneurysm. Circumferential mural thickening involving the proximal and mid esophagus and nonspecific but may reflect an underlying esophagitis, as can be seen with infectious or caustic esophagitis. This will be better assessed with endoscopy. No evidence of obstruction or perforation. LYMPH NODES: No pathologic thoracic adenopathy. LUNGS AND PLEURA: 13 mm pulmonary nodule versus intrapulmonary lymph node noted within the right  lower lobe (72/10). According to the Fleischner Society pulmonary nodule recommendations, the finding is consistent with a solitary solid nodule >8 mm and the recommendation is to consider CT at 3 months, PET-CT, or tissue sampling. Small bibasilar atelectasis. No focal consolidation or pulmonary edema. No evidence of pleural effusion or pneumothorax. UPPER ABDOMEN: Limited images of the upper abdomen are unremarkable. SOFT TISSUES AND BONES: Cervical thoracic and thoracolumbar fusion hardware partially visualized. Osseous structures are age appropriate. No acute bone abnormality. No lytic or blastic bone lesion. No acute soft tissue abnormality. IMPRESSION: 1. Small volume acute pulmonary emboli in the right lower lobe and  right upper lobe. No CT evidence of right heart strain. 2. 13 mm solid pulmonary nodule versus intrapulmonary lymph node in the right lower lobe, with recommendation to consider non-contrast chest CT at 3 months, PET/CT, or tissue sampling as per Fleischner Society Guidelines. 3. Circumferential mural thickening involving the proximal and mid esophagus, nonspecific but may reflect esophagitis, and can be further assessed with endoscopy. Electronically signed by: Dorethia Molt MD 07/11/2024 03:31 AM EST RP Workstation: [id number]   CT Hip Right Wo Contrast Result Date: 07/11/2024 EXAM: CT OF THE RIGHT HIP WITHOUT IV CONTRAST 07/11/2024 03:20:34 AM TECHNIQUE: CT of the right hip was performed without the administration of intravenous contrast. Multiplanar reformatted images are provided for review. Automated exposure control, iterative reconstruction, and/or weight based adjustment of the mA/kV was utilized to reduce the radiation dose to as low as reasonably achievable. COMPARISON: None available. CLINICAL HISTORY: fall right hip pain FINDINGS: BONES: Lumbosacral fusion with instrumentation and right iliac screw partially visualized. No acute fracture or dislocation. No aggressive appearing osseous abnormality or periostitis. SOFT TISSUE: There is thickening and hyperdensity of the gluteus maximus musculature posterior to the greater trochanter (56/8) in keeping with intramuscular hemorrhage. Overlying soft tissue swelling is partially visualized. No mass-forming hematoma identified. No significant fluid collections. JOINT: Mild right hip degenerative arthritis. No osseous erosions. INTRAPELVIC CONTENTS: Limited images of the intrapelvic contents are unremarkable. IMPRESSION: 1. No acute fracture or dislocation. 2. Intramuscular hemorrhage in the gluteus maximus musculature posterior to the greater trochanter with overlying soft tissue swelling, without mass-forming hematoma. Electronically signed by: Dorethia Molt MD 07/11/2024 03:38 AM EST RP Workstation: [id number]   DG Hip Unilat With Pelvis 2-3 Views Right Result Date: 07/11/2024 EXAM: 2 OR MORE VIEW(S) XRAY OF THE UNILATERAL HIP 07/11/2024 01:26:00 AM COMPARISON: None available. CLINICAL HISTORY: sob FINDINGS: BONES AND JOINTS: Pedicle screws L2-S1 are in place, bridging the sacroiliac joints with additional intervertebral spacers. Moderate left hip degenerative joint disease. No acute fracture. SOFT TISSUES: The soft tissues are unremarkable. IMPRESSION: 1. No acute fracture or dislocation. Electronically signed by: Dorethia Molt MD 07/11/2024 01:32 AM EST RP Workstation: [id number]   DG Chest 1 View Result Date: 07/11/2024 EXAM: 1 VIEW(S) XRAY OF THE CHEST 07/11/2024 01:26:00 AM COMPARISON: Findings compared to prior examination number 6 2008. CLINICAL HISTORY: sob FINDINGS: LUNGS AND PLEURA: No focal pulmonary opacity. No pleural effusion. No pneumothorax. HEART AND MEDIASTINUM: No acute abnormality of the cardiac and mediastinal silhouettes. BONES AND SOFT TISSUES: Interval cervicothoracic and thoracolumbar fusion with instrumentation, not well characterized on this exam. IMPRESSION: 1. No acute cardiopulmonary abnormality. Electronically signed by: Dorethia Molt MD 07/11/2024 01:31 AM EST RP Workstation: [id number]    Microbiology: Results for orders placed or performed during the hospital encounter of 07/11/24  Resp panel by RT-PCR (RSV, Flu A&B, Covid) Anterior Nasal Swab     Status: None  Collection Time: 07/11/24 12:55 AM   Specimen: Anterior Nasal Swab  Result Value Ref Range Status   SARS Coronavirus 2 by RT PCR NEGATIVE NEGATIVE Final    Comment: (NOTE) SARS-CoV-2 target nucleic acids are NOT DETECTED.  The SARS-CoV-2 RNA is generally detectable in upper respiratory specimens during the acute phase of infection. The lowest concentration of SARS-CoV-2 viral copies this assay can detect is 138 copies/mL. A negative result does  not preclude SARS-Cov-2 infection and should not be used as the sole basis for treatment or other patient management decisions. A negative result may occur with  improper specimen collection/handling, submission of specimen other than nasopharyngeal swab, presence of viral mutation(s) within the areas targeted by this assay, and inadequate number of viral copies(<138 copies/mL). A negative result must be combined with clinical observations, patient history, and epidemiological information. The expected result is Negative.  Fact Sheet for Patients:  bloggercourse.com  Fact Sheet for Healthcare Providers:  seriousbroker.it  This test is no t yet approved or cleared by the [place]  FDA and  has been authorized for detection and/or diagnosis of SARS-CoV-2 by FDA under an Emergency Use Authorization (EUA). This EUA will remain  in effect (meaning this test can be used) for the duration of the COVID-19 declaration under Section 564(b)(1) of the Act, 21 U.S.C.section 360bbb-3(b)(1), unless the authorization is terminated  or revoked sooner.       Influenza A by PCR NEGATIVE NEGATIVE Final   Influenza B by PCR NEGATIVE NEGATIVE Final    Comment: (NOTE) The Xpert Xpress SARS-CoV-2/FLU/RSV plus assay is intended as an aid in the diagnosis of influenza from Nasopharyngeal swab specimens and should not be used as a sole basis for treatment. Nasal washings and aspirates are unacceptable for Xpert Xpress SARS-CoV-2/FLU/RSV testing.  Fact Sheet for Patients: bloggercourse.com  Fact Sheet for Healthcare Providers: seriousbroker.it  This test is not yet approved or cleared by the [place]  FDA and has been authorized for detection and/or diagnosis of SARS-CoV-2 by FDA under an Emergency Use Authorization (EUA). This EUA will remain in effect (meaning this test can be used) for the  duration of the COVID-19 declaration under Section 564(b)(1) of the Act, 21 U.S.C. section 360bbb-3(b)(1), unless the authorization is terminated or revoked.     Resp Syncytial Virus by PCR NEGATIVE NEGATIVE Final    Comment: (NOTE) Fact Sheet for Patients: bloggercourse.com  Fact Sheet for Healthcare Providers: seriousbroker.it  This test is not yet approved or cleared by the [place]  FDA and has been authorized for detection and/or diagnosis of SARS-CoV-2 by FDA under an Emergency Use Authorization (EUA). This EUA will remain in effect (meaning this test can be used) for the duration of the COVID-19 declaration under Section 564(b)(1) of the Act, 21 U.S.C. section 360bbb-3(b)(1), unless the authorization is terminated or revoked.  Performed at [facility], [address] [place], [place] [address]   Culture, blood (routine x 2)     Status: None (Preliminary result)   Collection Time: 07/11/24  1:15 AM   Specimen: BLOOD  Result Value Ref Range Status   Specimen Description BLOOD RIGHT ANTECUBITAL  Final   Special Requests   Final    BOTTLES DRAWN AEROBIC AND ANAEROBIC Blood Culture results may not be optimal due to an inadequate volume of blood received in culture bottles   Culture   Final    NO GROWTH 1 DAY Performed at [facility], [address] [place], [place] [address]    Report  Status PENDING  Incomplete  Culture, blood (routine x 2)     Status: None (Preliminary result)   Collection Time: 07/11/24  1:36 AM   Specimen: BLOOD  Result Value Ref Range Status   Specimen Description BLOOD BLOOD RIGHT HAND  Final   Special Requests   Final    BOTTLES DRAWN AEROBIC AND ANAEROBIC Blood Culture adequate volume   Culture   Final    NO GROWTH 1 DAY Performed at [facility], [address], [place], [place] [address]    Report Status PENDING  Incomplete  MRSA Next Gen by PCR, Nasal     Status: None   Collection Time:  07/11/24  6:06 AM   Specimen: Nasal Mucosa; Nasal Swab  Result Value Ref Range Status   MRSA by PCR Next Gen NOT DETECTED NOT DETECTED Final    Comment: (NOTE) The GeneXpert MRSA Assay (FDA approved for NASAL specimens only), is one component of a comprehensive MRSA colonization surveillance program. It is not intended to diagnose MRSA infection nor to guide or monitor treatment for MRSA infections. Test performance is not FDA approved in patients less than 2 years old. Performed at [facility], [address], [place], [place] [address]     Labs: CBC: Recent Labs  Lab 07/11/24 0115 07/11/24 0506 07/12/24 0412  WBC 14.6* 14.5* 9.9  NEUTROABS 12.5* 11.8*  --   HGB 11.6* 10.6* 9.6*  HCT 35.2* 32.7* 29.8*  MCV 93.1 94.2 95.2  PLT 171 177 187   Basic Metabolic Panel: Recent Labs  Lab 07/11/24 0115 07/11/24 0506  NA 136 137  K 3.8 3.8  CL 96* 98  CO2 35* 35*  GLUCOSE 142* 135*  BUN 14 13  CREATININE 0.96 0.89  CALCIUM 8.9 8.5*  MG  --  1.9  PHOS  --  2.3*   Liver Function Tests: No results for input(s): AST, ALT, ALKPHOS, BILITOT, PROT, ALBUMIN  in the last 168 hours. CBG: No results for input(s): GLUCAP in the last 168 hours.  Discharge time spent: 26 minutes.  Length of inpatient stay: 1 days  Signed: Carliss LELON Canales, DO [facility] 07/12/2024         "

## 2024-07-12 NOTE — Progress Notes (Signed)
 Christine Burns to be D/C'd Home per MD order.  Discussed with the patient and all questions fully answered.  VSS, Skin clean, dry and intact without evidence of skin break down, no evidence of skin tears noted. IV catheter discontinued intact. Site without signs and symptoms of complications. Dressing and pressure applied.  An After Visit Summary was printed and given to the patient. Patient prescriptions sent to her pharmacy.  D/c education completed with patient/family including follow up instructions, medication list, d/c activities limitations if indicated, with other d/c instructions as indicated by MD - patient able to verbalize understanding, all questions fully answered.   Patient instructed to return to ED, call 911, or call MD for any changes in condition.   Patient escorted via WC, and D/C home via private auto.  Christine Burns 07/12/2024 4:05 PM

## 2024-07-12 NOTE — Care Management Obs Status (Signed)
 MEDICARE OBSERVATION STATUS NOTIFICATION   Patient Details  Name: Christine Burns MRN: [id number] Date of Birth: 1946/05/20   Medicare Observation Status Notification Given:  Yes    Noreen KATHEE Cleotilde ISRAEL 07/12/2024, 10:48 AM

## 2024-07-13 NOTE — ED Provider Notes (Addendum)
 [facility]  Emergency Department Provider Note     History   Chief Complaint Fall Amon about 1 hour ago. )   HPI  Christine Burns is a 78 y.o. female patient states that she rolled off the bed and fell down.  Complaining of pain in the back of the head.  Recently had spinal surgery for paresthesia or numbness.  Patient denies any nausea or vomiting.  No numbness or weakness of the arms or legs no chest pain no shortness of breath no injuries to the arms or shoulders or face no injuries to the abdomen.  No injuries to the back hips or legs.  Able to move both arms and both legs without any difficulty.    Past Medical History: No date: Depression No date: Fibromyalgia No date: Hypertension  No past surgical history on file.  Prior to Admission medications  Medication Dose, Route, Frequency  apixaban  (ELIQUIS ) 5 mg Tab Take by mouth.  baclofen  (LIORESAL ) 5 mg Tab tablet 5 mg, Every 8 hours PRN  dexmethylphenidate  (FOCALIN ) 10 MG tablet 10 mg  LINZESS  72 mcg capsule 72 mcg  nitrofurantoin, macrocrystal-monohydrate, (MACROBID) 100 MG capsule 100 mg  oxyCODONE  (ROXICODONE ) 15 MG immediate release tablet 15 mg  phenazopyridine (PYRIDIUM) 100 MG tablet 100 mg, Every 8 hours PRN  pregabalin  (LYRICA ) 100 MG capsule 100 mg  propranolol  (INNOPRAN  XL) 80 MG 24 hr capsule 80 mg, Daily (standard)  sulfamethoxazole -trimethoprim  (BACTRIM  DS) 800-160 mg per tablet 1 tablet  traZODone  (DESYREL ) 100 MG tablet 100 mg, Nightly  amLODIPine  (NORVASC ) 2.5 MG tablet 2.5 mg, Oral  DULoxetine  (CYMBALTA ) 60 MG capsule 60 mg, Oral, Daily (standard)  levothyroxine  (SYNTHROID ) 100 MCG tablet 100 mcg, Oral, Daily (standard)  methadone  (DOLOPHINE ) 10 MG tablet No dose, route, or frequency recorded.  methylphenidate HCl (RITALIN) 5 MG tablet No dose, route, or frequency recorded.  pantoprazole  (PROTONIX ) 40 MG tablet 40 mg, Oral, Daily (standard)  psyllium (METAMUCIL) 3.4 gram packet 1  packet, Oral, Daily PRN    Allergies Diazepam , Fentanyl , Ibuprofen, Zolpidem , Cyclobenzaprine , Demerol [meperidine], Opioids - morphine analogues, and Skelaxin [metaxalone]   Short Social History[1]  Review of Systems  All other systems reviewed and are negative.   As in HPI, all systems reviewed and otherwise negative.  Physical Exam    Vitals:   07/13/24 2200  BP: 131/93  Pulse: 86  Resp: (!) 24  Temp: 37.6 C (99.6 F)  TempSrc: Oral  SpO2: 95%  Weight: 67.3 kg (148 lb 4.8 oz)  Height: 157.5 cm (5' 2)     Physical Exam  Constitutional: Patient appears well-developed and well nourished. Non toxic in appearance. HEENT: Unremarkable.  Neck is supple cervical spine is nontender on palpation.  There is a scar from the surgery from few days ago at the lower cervical spine upper thoracic spine. Head: Atraumatic.  Eyes: Normal ocular movements. Neck: Supple with normal range of motion.  Pulmonary/Chest: Effort normal. No respiratory distress.  Chest wall is nontender back is nontender.  Lungs are clear to auscultation bilaterally Abdominal: Soft and non tender abdomen.  No negative: Negative Grey Turner sign abdomen soft with no tenderness elicited. Musculoskeletal: Extremities atraumatic.  No tenderness of the bilateral shoulders bilateral upper extremities bilateral hips pelvis and bilateral lower extremities are nontender. Neurological: Alert with no focal neurological deficit. Ambulatory with a steady gait.  No new focal deficits noted with no numbness or tingling beyond or different from her baseline which she sometimes feels in her right  hand.  She has normal motor strength.  Bilateral upper extremities normal and bilateral lower extremity major muscle groups strength is are normal.  Sensations intact in the bilateral upper lower extremities equal and symmetrical.  Downgoing Babinski bilaterally.  Reflexes are 2+ bilateral elbows and knees. Skin: Warm and dry.  Nursing  note and vital signs reviewed.   ED Course        Procedures  No orders of the defined types were placed in this encounter.   ED Results No results found for any visits on 07/13/24.  Radiology No results found.   Medical Decision Making   I have reviewed the vital signs and the nursing notes. Labs and radiology results that were available during my care of the patient were independently reviewed by me and considered in my medical decision making.    This is a comfortable appearing 78 year old female who rolled off the bed and fell down complaining of some headache or hitting the back of her head with no other injuries.  Physical examination unremarkable.  CT head CT cervical spine pending. 11 PM patient is refusing CAT scan of the head and neck.  Patient is alert awake and oriented to person place and time able to converse and answer questions appropriately normally and has refused CAT scan to be performed on her.  Patient states that she does not need it.  Patient has told this to both myself nurse and a CAT scan technician.  Patient still totally capable of understanding why she needs it and this was all explained to her in detail in layman's language.  A call was put out to patient's caretaker and niece one of the telephone where it said the messages were full the other 1 that I left for her for the niece I left a message and my telephone number to contact me back.  I am waiting for callback from the niece/caretaker. Medical Decision Making Amount and/or Complexity of Data Reviewed Labs: ordered. Radiology: ordered.  Risk Prescription drug management.     Differential Diagnosis: Fall closed head injury, cervical spine injury   12:11 AM patient has changed her mind and is now consenting to have CAT scan of the head and cervical spine done.  The nurse believes this was because she got received a phone call from her niece who may have talked to her. ED Clinical Impression    Final diagnoses:  None   Fall Closed head injury   Procedures      This record has been created using [employer]. Chart creation errors have been sought, but may not always have been located. Such creation errors do not reflect on the standard of medical care.    Maree Jonelle Lash, MD 07/13/24 2325       [1] Social History Tobacco Use   Smoking status: Never  Substance Use Topics   Alcohol  use: Not Currently   Drug use: Not Currently   Maree Jonelle Lash, MD 07/14/24 [phone]

## 2024-07-14 ENCOUNTER — Emergency Department (HOSPITAL_COMMUNITY)

## 2024-07-14 ENCOUNTER — Encounter (HOSPITAL_COMMUNITY): Payer: Self-pay

## 2024-07-14 ENCOUNTER — Emergency Department (HOSPITAL_COMMUNITY)
Admission: EM | Admit: 2024-07-14 | Discharge: 2024-07-19 | Disposition: A | Discharge status: Home / Self Care | Attending: Emergency Medicine | Admitting: Emergency Medicine

## 2024-07-14 ENCOUNTER — Other Ambulatory Visit: Payer: Self-pay

## 2024-07-14 DIAGNOSIS — S0990XA Unspecified injury of head, initial encounter: Secondary | ICD-10-CM | POA: Diagnosis not present

## 2024-07-14 DIAGNOSIS — Z981 Arthrodesis status: Secondary | ICD-10-CM | POA: Diagnosis not present

## 2024-07-14 DIAGNOSIS — N179 Acute kidney failure, unspecified: Secondary | ICD-10-CM | POA: Insufficient documentation

## 2024-07-14 DIAGNOSIS — Z86711 Personal history of pulmonary embolism: Secondary | ICD-10-CM | POA: Insufficient documentation

## 2024-07-14 DIAGNOSIS — S79911A Unspecified injury of right hip, initial encounter: Secondary | ICD-10-CM | POA: Diagnosis present

## 2024-07-14 DIAGNOSIS — R059 Cough, unspecified: Secondary | ICD-10-CM | POA: Diagnosis not present

## 2024-07-14 DIAGNOSIS — R509 Fever, unspecified: Secondary | ICD-10-CM | POA: Diagnosis not present

## 2024-07-14 DIAGNOSIS — S7001XA Contusion of right hip, initial encounter: Secondary | ICD-10-CM | POA: Diagnosis not present

## 2024-07-14 DIAGNOSIS — R630 Anorexia: Secondary | ICD-10-CM

## 2024-07-14 DIAGNOSIS — R110 Nausea: Secondary | ICD-10-CM

## 2024-07-14 DIAGNOSIS — K5904 Chronic idiopathic constipation: Secondary | ICD-10-CM

## 2024-07-14 DIAGNOSIS — R0602 Shortness of breath: Secondary | ICD-10-CM | POA: Diagnosis not present

## 2024-07-14 DIAGNOSIS — W19XXXA Unspecified fall, initial encounter: Secondary | ICD-10-CM | POA: Diagnosis not present

## 2024-07-14 DIAGNOSIS — M542 Cervicalgia: Secondary | ICD-10-CM | POA: Diagnosis not present

## 2024-07-14 DIAGNOSIS — Z7901 Long term (current) use of anticoagulants: Secondary | ICD-10-CM | POA: Insufficient documentation

## 2024-07-14 DIAGNOSIS — G8918 Other acute postprocedural pain: Secondary | ICD-10-CM | POA: Insufficient documentation

## 2024-07-14 LAB — COMPREHENSIVE METABOLIC PANEL WITH GFR
ALT: 44 U/L (ref 0–44)
AST: 72 U/L — ABNORMAL HIGH (ref 15–41)
Albumin: 3.6 g/dL (ref 3.5–5.0)
Alkaline Phosphatase: 53 U/L (ref 38–126)
Anion gap: 7 (ref 5–15)
BUN: 24 mg/dL — ABNORMAL HIGH (ref 8–23)
CO2: 32 mmol/L (ref 22–32)
Calcium: 8.6 mg/dL — ABNORMAL LOW (ref 8.9–10.3)
Chloride: 99 mmol/L (ref 98–111)
Creatinine, Ser: 1.4 mg/dL — ABNORMAL HIGH (ref 0.44–1.00)
GFR, Estimated: 38 mL/min — ABNORMAL LOW
Glucose, Bld: 94 mg/dL (ref 70–99)
Potassium: 3.4 mmol/L — ABNORMAL LOW (ref 3.5–5.1)
Sodium: 138 mmol/L (ref 135–145)
Total Bilirubin: 0.3 mg/dL (ref 0.0–1.2)
Total Protein: 6.2 g/dL — ABNORMAL LOW (ref 6.5–8.1)

## 2024-07-14 LAB — RESP PANEL BY RT-PCR (RSV, FLU A&B, COVID)  RVPGX2
Influenza A by PCR: NEGATIVE
Influenza B by PCR: NEGATIVE
Resp Syncytial Virus by PCR: NEGATIVE
SARS Coronavirus 2 by RT PCR: NEGATIVE

## 2024-07-14 LAB — I-STAT CHEM 8, ED
BUN: 24 mg/dL — ABNORMAL HIGH (ref 8–23)
Calcium, Ion: 1.11 mmol/L — ABNORMAL LOW (ref 1.15–1.40)
Chloride: 97 mmol/L — ABNORMAL LOW (ref 98–111)
Creatinine, Ser: 1.6 mg/dL — ABNORMAL HIGH (ref 0.44–1.00)
Glucose, Bld: 94 mg/dL (ref 70–99)
HCT: 25 % — ABNORMAL LOW (ref 36.0–46.0)
Hemoglobin: 8.5 g/dL — ABNORMAL LOW (ref 12.0–15.0)
Potassium: 3.3 mmol/L — ABNORMAL LOW (ref 3.5–5.1)
Sodium: 137 mmol/L (ref 135–145)
TCO2: 26 mmol/L (ref 22–32)

## 2024-07-14 LAB — CBC WITH DIFFERENTIAL/PLATELET
Abs Immature Granulocytes: 0.1 K/uL — ABNORMAL HIGH (ref 0.00–0.07)
Basophils Absolute: 0.1 K/uL (ref 0.0–0.1)
Basophils Relative: 1 %
Eosinophils Absolute: 0.3 K/uL (ref 0.0–0.5)
Eosinophils Relative: 3 %
HCT: 27.3 % — ABNORMAL LOW (ref 36.0–46.0)
Hemoglobin: 9.1 g/dL — ABNORMAL LOW (ref 12.0–15.0)
Immature Granulocytes: 1 %
Lymphocytes Relative: 20 %
Lymphs Abs: 2 K/uL (ref 0.7–4.0)
MCH: 31.2 pg (ref 26.0–34.0)
MCHC: 33.3 g/dL (ref 30.0–36.0)
MCV: 93.5 fL (ref 80.0–100.0)
Monocytes Absolute: 1.3 K/uL — ABNORMAL HIGH (ref 0.1–1.0)
Monocytes Relative: 13 %
Neutro Abs: 6.3 K/uL (ref 1.7–7.7)
Neutrophils Relative %: 62 %
Platelets: 267 K/uL (ref 150–400)
RBC: 2.92 MIL/uL — ABNORMAL LOW (ref 3.87–5.11)
RDW: 13.3 % (ref 11.5–15.5)
WBC: 10.1 K/uL (ref 4.0–10.5)
nRBC: 0 % (ref 0.0–0.2)

## 2024-07-14 LAB — PROTIME-INR
INR: 1.4 — ABNORMAL HIGH (ref 0.8–1.2)
Prothrombin Time: 18 s — ABNORMAL HIGH (ref 11.4–15.2)

## 2024-07-14 LAB — LACTIC ACID, PLASMA: Lactic Acid, Venous: 0.7 mmol/L (ref 0.5–1.9)

## 2024-07-14 MED ORDER — ONDANSETRON HCL 4 MG/2ML IJ SOLN
4.0000 mg | Freq: Once | INTRAMUSCULAR | Status: AC
  Administered 2024-07-14: 4 mg via INTRAVENOUS
  Filled 2024-07-14: qty 2

## 2024-07-14 MED ORDER — HYDROMORPHONE HCL 1 MG/ML IJ SOLN
1.0000 mg | Freq: Once | INTRAMUSCULAR | Status: AC
  Administered 2024-07-14: 1 mg via INTRAVENOUS
  Filled 2024-07-14: qty 1

## 2024-07-14 MED ORDER — LACTATED RINGERS IV BOLUS
1000.0000 mL | Freq: Once | INTRAVENOUS | Status: AC
  Administered 2024-07-14: 1000 mL via INTRAVENOUS

## 2024-07-14 MED ORDER — IOHEXOL 350 MG/ML SOLN
60.0000 mL | Freq: Once | INTRAVENOUS | Status: AC | PRN
  Administered 2024-07-14: 60 mL via INTRAVENOUS

## 2024-07-14 MED ORDER — ACETAMINOPHEN 500 MG PO TABS
1000.0000 mg | ORAL_TABLET | Freq: Once | ORAL | Status: AC
  Administered 2024-07-14: 1000 mg via ORAL
  Filled 2024-07-14: qty 2

## 2024-07-14 MED ORDER — HYDROMORPHONE HCL 1 MG/ML IJ SOLN: 0.5000 mg | Freq: Once | INTRAMUSCULAR | Status: DC

## 2024-07-14 NOTE — ED Triage Notes (Addendum)
 Pt from home complains of neck pain following neck procedure 1 week ago, Pt states that she's had the pain since the procedure and medication doesn't provide relief., pt rates pain 10/10,  Pt also endorse CP and SOB that started this morning. temp 101.3 in triage. Pt states hx of blood clots in lungs and tremors. Also complaining on hip pain, seen for same a few days ago but still endorses pain.

## 2024-07-14 NOTE — ED Provider Notes (Signed)
 " [place] EMERGENCY DEPARTMENT AT [facility] Provider Note   CSN: [id number] Arrival date & time: 07/14/24  2053     Patient presents with: Neck Pain and Hip Pain   Christine Burns is a 78 y.o. female.  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  Has been seen in the emergency department multiple times recently.  Has had frequent falls.  Says that she had 1 where she hit her hip in the past 24 hours and has been having some pain and bruising there.  Was admitted for diagnosis of a PE on 12/17 but says that she has missed some Eliquis  doses.  Was treated for UTI but urine culture was negative.  Also was in the emergency department yesterday after a fall.  Says that since yesterday has developed a fever, cough, chest pain, and shortness of breath.  Says that her neck pain from the surgical site is also worse       Prior to Admission medications  Medication Sig Start Date End Date Taking? Authorizing Provider  amLODipine  (NORVASC ) 5 MG tablet Take 2.5 mg by mouth daily. 01/20/23   [provider]  apixaban  (ELIQUIS ) 5 MG TABS tablet Take 2 tablets (10 mg total) by mouth 2 (two) times daily for 5 days, THEN 1 tablet (5 mg total) 2 (two) times daily. 07/12/24 10/15/24  Arlon Carliss ORN, DO  Cyanocobalamin  (B-12 PO) Take 1 tablet by mouth daily.    [provider]  dexmethylphenidate  (FOCALIN ) 10 MG tablet Take 10 mg by mouth 2 (two) times daily. Do not take after 2 pm 06/08/24   [provider]  Ferrous Sulfate (IRON PO) Take 1 tablet by mouth daily.    [provider]  fluticasone  (FLONASE ) 50 MCG/ACT nasal spray Place 1-2 sprays into both nostrils daily as needed for allergies. 07/30/20    [provider]  levothyroxine  (SYNTHROID , LEVOTHROID) 100 MCG tablet Take 100 mcg by mouth daily. 09/04/15   [provider]  LINZESS  72 MCG capsule Take 72 mcg by mouth daily before breakfast. 05/31/24   [provider]  loratadine  (CLARITIN ) 10 MG tablet Take 10 mg by mouth daily as needed for allergies.     [provider]  methadone  (DOLOPHINE ) 10 MG tablet Take 20 mg by mouth 2 (two) times daily. 07/28/20   [provider]  nitrofurantoin, macrocrystal-monohydrate, (MACROBID) 100 MG capsule Take 100 mg by mouth 2 (two) times daily. Patient not taking: Reported on 07/12/2024 06/24/24   [provider]  ondansetron  (ZOFRAN ) 4 MG tablet Take 1 tablet (4 mg total) by mouth every 6 (six) hours as needed for nausea. 04/09/21   Swinteck, Redell, MD  oxyCODONE  (ROXICODONE ) 15 MG immediate release tablet Take 15 mg by mouth every 4 (four) hours as needed for pain. Max daily amount 90 mg 07/09/24 08/08/24  [provider]  pantoprazole  (PROTONIX ) 40 MG tablet Take 1 tablet (40 mg total) by mouth daily. 01/22/24   Ahmed, Muhammad F, MD  polyethylene glycol powder (GLYCOLAX /MIRALAX ) 17 GM/SCOOP powder Take 17 g by mouth daily. 01/22/24   [provider]  pregabalin  (LYRICA ) 100 MG capsule Take 100 mg by mouth 2 (two) times daily. 03/27/21   [provider]  propranolol  ER (INDERAL   LA) 120 MG 24 hr capsule Take 120 mg by mouth daily. 01/16/21   [provider]  sulfamethoxazole -trimethoprim  (BACTRIM  DS) 800-160 MG tablet Take 1 tablet by mouth 2 (two) times daily. 07/12/24   Arlon Carliss ORN, DO  tamsulosin  (FLOMAX ) 0.4 MG CAPS capsule Take 1 capsule (0.4 mg total) by mouth daily. 07/13/24   Arlon Carliss ORN, DO  traZODone  (DESYREL ) 100 MG tablet Take 100 mg by mouth at bedtime. 06/29/06   [provider]    Allergies: Fentanyl , Ibuprofen, Zolpidem , Cyclobenzaprine , Diazepam , Morphine and codeine, Zolpidem  tartrate,  Metaxalone, Meperidine, and Morphine    Review of Systems  Updated Vital Signs BP 139/77 (BP Location: Left Arm)   Pulse 90   Temp (!) 101.3 F (38.5 C) (Oral)   Resp (!) 21   Ht 5' 2 (1.575 m)   Wt 63.5 kg   SpO2 96%   BMI 25.62 kg/m   Physical Exam Vitals and nursing note reviewed.  Constitutional:      General: She is not in acute distress.    Appearance: She is well-developed.  HENT:     Head: Normocephalic and atraumatic.     Right Ear: External ear normal.     Left Ear: External ear normal.     Nose: Nose normal.  Eyes:     Extraocular Movements: Extraocular movements intact.     Conjunctiva/sclera: Conjunctivae normal.     Pupils: Pupils are equal, round, and reactive to light.  Neck:     Comments: Soft cervical collar on.  Surgical site from her spinal fusion appears somewhat erythematous.  No purulence from the wound. Cardiovascular:     Rate and Rhythm: Normal rate and regular rhythm.     Heart sounds: No murmur heard. Pulmonary:     Effort: Pulmonary effort is normal. No respiratory distress.     Breath sounds: Normal breath sounds.  Abdominal:     General: Abdomen is flat. There is no distension.     Palpations: Abdomen is soft. There is no mass.     Tenderness: There is no abdominal tenderness. There is no guarding.  Musculoskeletal:     Right lower leg: No edema.     Left lower leg: No edema.     Comments: Tenderness to palpation of the right hip with bruising noted  Skin:    General: Skin is warm and dry.  Neurological:     Mental Status: She is alert and oriented to person, place, and time. Mental status is at baseline.  Psychiatric:        Mood and Affect: Mood normal.     (all labs ordered are listed, but only abnormal results are displayed) Labs Reviewed  CULTURE, BLOOD (ROUTINE X 2)  CULTURE, BLOOD (ROUTINE X 2)  COMPREHENSIVE METABOLIC PANEL WITH GFR  LACTIC ACID, PLASMA  LACTIC ACID, PLASMA  CBC WITH DIFFERENTIAL/PLATELET   PROTIME-INR  URINALYSIS, W/ REFLEX TO CULTURE (INFECTION SUSPECTED)    EKG: None  Radiology: No results found.  {Document cardiac monitor, telemetry assessment procedure when appropriate:32947} Procedures   Medications Ordered in the ED - No data to display    {Click here for ABCD2, HEART and other calculators REFRESH Note before signing:1}                              Medical Decision Making Amount and/or Complexity of Data Reviewed Labs: ordered. Radiology: ordered.  Risk Prescription drug management.   ***  {  Document critical care time when appropriate  Document review of labs and clinical decision tools ie CHADS2VASC2, etc  Document your independent review of radiology images and any outside records  Document your discussion with family members, caretakers and with consultants  Document social determinants of health affecting pt's care  Document your decision making why or why not admission, treatments were needed:32947:::1}   Final diagnoses:  None    ED Discharge Orders     None        "

## 2024-07-15 ENCOUNTER — Emergency Department (HOSPITAL_COMMUNITY)

## 2024-07-15 DIAGNOSIS — I959 Hypotension, unspecified: Secondary | ICD-10-CM | POA: Diagnosis not present

## 2024-07-15 DIAGNOSIS — R509 Fever, unspecified: Secondary | ICD-10-CM | POA: Diagnosis not present

## 2024-07-15 DIAGNOSIS — Z7401 Bed confinement status: Secondary | ICD-10-CM | POA: Diagnosis not present

## 2024-07-15 LAB — URINALYSIS, W/ REFLEX TO CULTURE (INFECTION SUSPECTED)
Bacteria, UA: NONE SEEN
Bilirubin Urine: NEGATIVE
Glucose, UA: NEGATIVE mg/dL
Hgb urine dipstick: NEGATIVE
Ketones, ur: NEGATIVE mg/dL
Nitrite: NEGATIVE
Protein, ur: 30 mg/dL — AB
Specific Gravity, Urine: 1.017 (ref 1.005–1.030)
pH: 6 (ref 5.0–8.0)

## 2024-07-15 LAB — RESPIRATORY PANEL BY PCR

## 2024-07-15 LAB — SEDIMENTATION RATE: Sed Rate: 80 mm/h — ABNORMAL HIGH (ref 0–30)

## 2024-07-15 LAB — C-REACTIVE PROTEIN: CRP: 14.2 mg/dL — ABNORMAL HIGH

## 2024-07-15 LAB — LACTIC ACID, PLASMA: Lactic Acid, Venous: 1.2 mmol/L (ref 0.5–1.9)

## 2024-07-15 MED ORDER — SODIUM CHLORIDE 0.9 % IV SOLN
2.0000 g | Freq: Once | INTRAVENOUS | Status: AC
  Administered 2024-07-15: 2 g via INTRAVENOUS
  Filled 2024-07-15: qty 12.5

## 2024-07-15 MED ORDER — GADOBUTROL 1 MMOL/ML IV SOLN
6.0000 mL | Freq: Once | INTRAVENOUS | Status: AC | PRN
  Administered 2024-07-15: 6 mL via INTRAVENOUS

## 2024-07-15 MED ORDER — LORATADINE 10 MG PO TABS: 10.0000 mg | ORAL_TABLET | Freq: Every day | ORAL | Status: DC | PRN

## 2024-07-15 MED ORDER — HYDROMORPHONE HCL 1 MG/ML IJ SOLN
0.5000 mg | INTRAMUSCULAR | Status: DC | PRN
  Administered 2024-07-15: 0.5 mg via INTRAVENOUS
  Filled 2024-07-15 (×3): qty 1

## 2024-07-15 MED ORDER — AMLODIPINE BESYLATE 5 MG PO TABS
2.5000 mg | ORAL_TABLET | Freq: Every day | ORAL | Status: DC
  Administered 2024-07-15 – 2024-07-19 (×5): 2.5 mg via ORAL
  Filled 2024-07-15 (×5): qty 1

## 2024-07-15 MED ORDER — HYDROMORPHONE HCL 1 MG/ML IJ SOLN
0.5000 mg | Freq: Once | INTRAMUSCULAR | Status: AC
  Administered 2024-07-15: 0.5 mg via INTRAVENOUS
  Filled 2024-07-15: qty 0.5

## 2024-07-15 MED ORDER — PREGABALIN 100 MG PO CAPS
100.0000 mg | ORAL_CAPSULE | Freq: Two times a day (BID) | ORAL | Status: DC
  Administered 2024-07-15 – 2024-07-19 (×9): 100 mg via ORAL
  Filled 2024-07-15 (×9): qty 1

## 2024-07-15 MED ORDER — METHADONE HCL 10 MG PO TABS
20.0000 mg | ORAL_TABLET | Freq: Two times a day (BID) | ORAL | Status: DC
  Administered 2024-07-15 – 2024-07-19 (×8): 20 mg via ORAL
  Filled 2024-07-15 (×8): qty 2

## 2024-07-15 MED ORDER — VANCOMYCIN HCL 1250 MG/250ML IV SOLN
1250.0000 mg | Freq: Once | INTRAVENOUS | Status: AC
  Administered 2024-07-15: 1250 mg via INTRAVENOUS
  Filled 2024-07-15: qty 250

## 2024-07-15 MED ORDER — PANTOPRAZOLE SODIUM 40 MG PO TBEC
40.0000 mg | DELAYED_RELEASE_TABLET | Freq: Every day | ORAL | Status: DC
  Administered 2024-07-17 – 2024-07-18 (×2): 40 mg via ORAL
  Filled 2024-07-15 (×5): qty 1

## 2024-07-15 MED ORDER — APIXABAN 5 MG PO TABS
5.0000 mg | ORAL_TABLET | Freq: Two times a day (BID) | ORAL | Status: DC
  Administered 2024-07-17 – 2024-07-19 (×5): 5 mg via ORAL
  Filled 2024-07-15 (×5): qty 1

## 2024-07-15 MED ORDER — PROPRANOLOL HCL ER 60 MG PO CP24
120.0000 mg | ORAL_CAPSULE | Freq: Every day | ORAL | Status: DC
  Administered 2024-07-15 – 2024-07-19 (×5): 120 mg via ORAL
  Filled 2024-07-15 (×5): qty 2

## 2024-07-15 MED ORDER — TRAZODONE HCL 100 MG PO TABS
100.0000 mg | ORAL_TABLET | Freq: Every day | ORAL | Status: DC
  Administered 2024-07-15 – 2024-07-18 (×4): 100 mg via ORAL
  Filled 2024-07-15 (×4): qty 1

## 2024-07-15 MED ORDER — LEVOTHYROXINE SODIUM 100 MCG PO TABS
100.0000 ug | ORAL_TABLET | Freq: Every day | ORAL | Status: DC
  Administered 2024-07-15 – 2024-07-19 (×5): 100 ug via ORAL
  Filled 2024-07-15 (×5): qty 1

## 2024-07-15 MED ORDER — APIXABAN 5 MG PO TABS
10.0000 mg | ORAL_TABLET | Freq: Two times a day (BID) | ORAL | Status: AC
  Administered 2024-07-15 – 2024-07-16 (×4): 10 mg via ORAL
  Filled 2024-07-15 (×4): qty 2

## 2024-07-15 MED ORDER — LINACLOTIDE 72 MCG PO CAPS
72.0000 ug | ORAL_CAPSULE | Freq: Every day | ORAL | Status: DC
  Administered 2024-07-17 – 2024-07-18 (×2): 72 ug via ORAL
  Filled 2024-07-15 (×5): qty 1

## 2024-07-15 MED ORDER — OXYCODONE HCL 5 MG PO TABS
15.0000 mg | ORAL_TABLET | ORAL | Status: DC | PRN
  Administered 2024-07-15 – 2024-07-19 (×16): 15 mg via ORAL
  Filled 2024-07-15 (×17): qty 3

## 2024-07-15 MED ORDER — DEXMETHYLPHENIDATE HCL 5 MG PO TABS
10.0000 mg | ORAL_TABLET | Freq: Two times a day (BID) | ORAL | Status: DC
  Administered 2024-07-16 – 2024-07-19 (×6): 10 mg via ORAL
  Filled 2024-07-15 (×7): qty 2

## 2024-07-15 MED ORDER — HALOPERIDOL LACTATE 5 MG/ML IJ SOLN
2.0000 mg | Freq: Once | INTRAMUSCULAR | Status: AC
  Administered 2024-07-15: 2 mg via INTRAMUSCULAR
  Filled 2024-07-15: qty 1

## 2024-07-15 NOTE — ED Notes (Signed)
 IV infiltrated. Pt not willing to be stuck again for an IV. Notified MD.

## 2024-07-15 NOTE — ED Notes (Signed)
 Patient  brought back from MRI unable to lay still for exam.

## 2024-07-15 NOTE — ED Provider Notes (Signed)
 Patient transferred from [facility] for MRI.  She is status post C6 T2 fusion on December 15.  Today she had a fall, found to be febrile.  She complains of runny nose, cough and scratchy throat that started today.  Recently she was diagnosed with a pulmonary embolism.    Pt had trouble tolerating MRI, is intolerant to benzos.  Pt medicated with haldol .  Pt care transferred pending MRI.    Griselda Norris, MD 07/15/24 2306

## 2024-07-15 NOTE — ED Notes (Signed)
 Patient transported to MRI

## 2024-07-15 NOTE — Evaluation (Signed)
 Physical Therapy Evaluation Patient Details Name: Christine Burns MRN: [id number] DOB: 1945/08/18 Today's Date: 07/15/2024  History of Present Illness  The pt is a 78 yo female presenting 12/21 with neck pain, CP, and SOB. Recent ED visits on 12/18 for SOB and hip pain after a fall, found to have acute PE.  PMH includes: frequent falls, chronic pain on methadone , fibromyalgia, PE on Eliquis , spinal fusion C6-T2 on 12/15 (at [facility], d/c home on 12/17).   Clinical Impression  Pt in bed upon arrival of PT, agreeable to evaluation at this time. Prior to admission the pt was at home alone in her apt, using RW for mobility in the house, and reports at least 2 falls in last 5 days since d/c home on 12/16. Pt presents with impaired memory, awareness, and problem solving in addition to deficits in LE strength and dynamic stability. She needed min-modA to complete bed mobility but only CGA for ambulation and sit-stand transfers. The pt does present with mild sway and drift with gait, but did not have overt LOB at this time. Pt reports she is unable to recall how many falls she has had or the situation that caused the falls, and demos poor problem solving and awareness as she states her plan to reduce hospital re-admissions is to stop calling EMS when she has a problem. The pt would benefit from short stint of continued inpatient rehab <3hours/day to facilitate improved strength, endurance, and dynamic stability prior to return home alone. If unable to attend rehab, pt would need supervision during the day, max HHPT, would benefit from aide assist for ADLs, and could benefit from use of friend's WC in her home when fatigued.       If plan is discharge home, recommend the following: A little help with walking and/or transfers;A little help with bathing/dressing/bathroom;Assistance with cooking/housework;Assist for transportation;Supervision due to cognitive status;Help with stairs or ramp for entrance   Can travel by  private vehicle   Yes    Equipment Recommendations BSC/3in1 (wheelchair but pt states she has one from a friend)  Recommendations for Other Services       Functional Status Assessment Patient has had a recent decline in their functional status and demonstrates the ability to make significant improvements in function in a reasonable and predictable amount of time.     Precautions / Restrictions Precautions Precautions: Fall;Cervical Precaution Booklet Issued: No Recall of Precautions/Restrictions: Intact Required Braces or Orthoses: Cervical Brace Cervical Brace: Soft collar Restrictions Weight Bearing Restrictions Per Provider Order: No      Mobility  Bed Mobility Overal bed mobility: Needs Assistance Bed Mobility: Rolling, Sidelying to Sit, Sit to Sidelying Rolling: Contact guard assist Sidelying to sit: Min assist     Sit to sidelying: Mod assist General bed mobility comments: increased assist to manage trunk in bed, discussed sleeping in recliner    Transfers Overall transfer level: Needs assistance Equipment used: Rolling walker (2 wheels) Transfers: Sit to/from Stand Sit to Stand: Contact guard assist           General transfer comment: CGA with RW, good stability with initial stand    Ambulation/Gait Ambulation/Gait assistance: Contact guard assist Gait Distance (Feet): 150 Feet Assistive device: Rolling walker (2 wheels) Gait Pattern/deviations: Step-through pattern, Decreased stride length, Drifts right/left, Trunk flexed Gait velocity: decreased Gait velocity interpretation: <1.31 ft/sec, indicative of household ambulator   General Gait Details: pt with mild trunk flexion and mild lateral drift. no overt buckling or LOB  Balance Overall balance assessment: Needs assistance Sitting-balance support: No upper extremity supported, Feet supported Sitting balance-Leahy Scale: Good     Standing balance support: Bilateral upper extremity supported,  During functional activity Standing balance-Leahy Scale: Poor Standing balance comment: mild instability and UE tremors in standing, no overt buckling or LOB with gait when BUE supported                             Pertinent Vitals/Pain Pain Assessment Pain Assessment: 0-10 Pain Score: 8  Pain Location: neck Pain Descriptors / Indicators: Discomfort, Grimacing Pain Intervention(s): Limited activity within patient's tolerance, Monitored during session, Repositioned    Home Living Family/patient expects to be discharged to:: Private residence Living Arrangements: Alone Available Help at Discharge:  (pt reports none) Type of Home: Apartment Home Access: Level entry       Home Layout: One level Home Equipment: Agricultural Consultant (2 wheels) (pt reports)      Prior Function Prior Level of Function : Independent/Modified Independent;Needs assist;History of Falls (last six months)             Mobility Comments: pt reports use of RW at home, at first reports she is unsure of how many falls, later states it is likely 2 falls. the pt was unable to describe falls or situation that led to falling. ADLs Comments: pt reports not compelting ADLs such as bathing since d/c due to difficulty     Extremity/Trunk Assessment   Upper Extremity Assessment Upper Extremity Assessment: Overall WFL for tasks assessed    Lower Extremity Assessment Lower Extremity Assessment: Generalized weakness (grossly 4/5 to MMT)    Cervical / Trunk Assessment Cervical / Trunk Assessment: Neck Surgery (cervical fusion 12/15)  Communication   Communication Communication: No apparent difficulties    Cognition Arousal: Alert Behavior During Therapy: WFL for tasks assessed/performed   PT - Cognitive impairments: No family/caregiver present to determine baseline, Awareness, Safety/Judgement, Memory, Problem solving                       PT - Cognition Comments: pt with limited insight to  deficits, need for assistance, reports she is aware, but states her plan to stop coming back to hospital is to stop calling EMS when she has issues. able to recall cervical precautions and brace useage, not able to recall how many times she fell Following commands: Intact       Cueing Cueing Techniques: Verbal cues     General Comments General comments (skin integrity, edema, etc.): VSS on RA    Exercises     Assessment/Plan    PT Assessment Patient needs continued PT services  PT Problem List Decreased strength;Decreased activity tolerance;Decreased mobility;Decreased balance;Decreased cognition;Decreased safety awareness       PT Treatment Interventions DME instruction;Gait training;Functional mobility training;Stair training;Therapeutic activities;Therapeutic exercise;Balance training;Patient/family education    PT Goals (Current goals can be found in the Care Plan section)  Acute Rehab PT Goals Patient Stated Goal: to return home PT Goal Formulation: With patient Time For Goal Achievement: 07/29/24 Potential to Achieve Goals: Good    Frequency Min 2X/week        AM-PAC PT 6 Clicks Mobility  Outcome Measure Help needed turning from your back to your side while in a flat bed without using bedrails?: A Little Help needed moving from lying on your back to sitting on the side of a flat bed without using bedrails?: A Lot  Help needed moving to and from a bed to a chair (including a wheelchair)?: A Little Help needed standing up from a chair using your arms (e.g., wheelchair or bedside chair)?: A Little Help needed to walk in hospital room?: A Little Help needed climbing 3-5 steps with a railing? : A Lot 6 Click Score: 16    End of Session Equipment Utilized During Treatment: Gait belt Activity Tolerance: Patient tolerated treatment well Patient left: in bed Nurse Communication: Mobility status PT Visit Diagnosis: Unsteadiness on feet (R26.81);Repeated falls  (R29.6);Muscle weakness (generalized) (M62.81)    Time: 8673-8597 PT Time Calculation (min) (ACUTE ONLY): 36 min   Charges:   PT Evaluation $PT Eval Low Complexity: 1 Low PT Treatments $Therapeutic Exercise: 8-22 mins PT General Charges $$ ACUTE PT VISIT: 1 Visit         Izetta Call, PT, DPT   Acute Rehabilitation Department Office [phone] Secure Chat Communication Preferred  Izetta JULIANNA Call 07/15/2024, 2:23 PM

## 2024-07-15 NOTE — ED Notes (Signed)
 Pt arrived from [facility] for MRI

## 2024-07-15 NOTE — Progress Notes (Addendum)
 CSW received call from pts family member, Raynald Shy [phone]), who reported pt contacted her stating she would be returning home. Raynald stated she resides with pt but will not be home to continue assisting with pt care. Kensly expressed concerns regarding pts decline in mobility, which she believes contributed to pts return to the hospital. CSW requested PT evaluation of pts mobility prior to dc home, as pt would be alone without caregiver support. Awaiting PT evaluation; CSW to follow.  Update 2pm- PT recommending SNF. FL2 completed and SNF ref faxed awaiting bed offers.

## 2024-07-15 NOTE — Progress Notes (Signed)
 Patient presents to the ED in the context of frequent falls, family advocating for PT evaluation. PT currently recommending SNF, patient boarding for placement. Referrals launched by daylight CSW.   CSW met with patient at bedside to review accepting facilities. Education on SNF process provided, patient able to verbalize understanding. Patient agreeable to pursue SNF LOC, stated that she would prefer a SNF near to her home in [place], [place]. Upon review, no accepting facilities in [place]. Further reviewed Medicare Star Ratings List, patient selects [facility] SNF. CSW spoke with Josh in admissions who affirmed ability to accept. Auth submitted via [employer] for [facility] 12/23, pending auth#: [id number]. Attempted to provide further education on insurance process but patient was sleeping upon return. Will reattempt as able.   TOC following. Anticipate discharge 12/23 to [facility] SNF pending auth approval.

## 2024-07-15 NOTE — ED Provider Notes (Addendum)
 Care of the patient assumed at signout.  Now, MRI results are available, slight edematous change, posterior paraspinous tissue, no discrete fluid collection. Vital signs unremarkable currently, but she was febrile for much of the night.  She has received antibiotics. Patient notes her pain is slightly better now. I have spoken with the patient's neurosurgeon, Dr. Gust.  He has reviewed the MRI results, post surgical.  With normal lactic acidosis, resolution of fever, patient's improved condition according to her, patient will have expedited and closer follow-up with her neurosurgery office, they will contact her.  Patient with persistent weakness, and frequent falls, TOC consulted per family request for PT, poss placement.   Garrick Charleston, MD 07/15/24 1132    Garrick Charleston, MD 07/15/24 [phone]

## 2024-07-15 NOTE — Discharge Instructions (Addendum)
 Your studies today have been generally reassuring.  Please monitor your condition carefully and be sure to follow-up with your neurosurgeon.  He is aware of your visit to the emergency department today, and those over the past few days.  If you develop new, or concerning changes, call 911 or return to the hospital.  We have ordered home health and PT services, but these are unlikely to begin until next week. They should contact you by phone to discuss this.

## 2024-07-15 NOTE — Progress Notes (Signed)
 Transition of Care [facility]) - Inpatient Brief Assessment   Patient Details  Name: ELLANORE VANHOOK MRN: [id number] Date of Birth: 1945/12/02  Transition of Care [facility]) CM/SW Contact:    Debarah Saunas, RN Phone Number: 07/15/2024, 10:59 AM   Clinical Narrative: RNCM consulted regarding transportation home. RNCM met with pt at bedside to discuss options.  Pt utilizes rolling walker for ambulation and does not have it with her. She states that there is  no one that can bring it out to the taxi.  She says she does not do well with a cane and we do not have any extra rolling walkers at this time.  RNCM suggests PTAR for transportation home.  Transition of Care Asessment: Insurance and Status: (P) Insurance coverage has been reviewed Patient has primary care physician: (P) Yes Home environment has been reviewed: (P) From home alone Prior level of function:: (P) independent; utilizes rolling walker for ambulation Prior/Current Home Services: (P) No current home services Social Drivers of Health Review: (P) SDOH reviewed no interventions necessary Readmission risk has been reviewed: (P) Yes Transition of care needs: (P) transition of care needs identified, TOC will continue to follow

## 2024-07-15 NOTE — ED Notes (Signed)
 Niece called this nurse to state that if pt is d/c she is unwilling to come to pick patient up.

## 2024-07-15 NOTE — [place] FL2 (Signed)
 " [place]  MEDICAID FL2 LEVEL OF CARE FORM     IDENTIFICATION  Patient Name: Christine Burns Birthdate: [birth year] Sex: female Admission Date (Current Location): 07/14/2024  [facility] and [place] Number:  Producer, Television/film/video and Address:  [facility], [address], [place], [place] [address]      Provider Number: [id number]  Attending Physician Name and Address:  Garrick Charleston, MD  Relative Name and Phone Number:  Martin,Vickie  Niece, Emergency Contact  [phone]    Current Level of Care: SNF Recommended Level of Care: Skilled Nursing Facility Prior Approval Number:    Date Approved/Denied:   PASRR Number: [id number]  Discharge Plan: SNF    Current Diagnoses: Patient Active Problem List   Diagnosis Date Noted   Acute pulmonary embolism (HCC) 07/11/2024   HTN (hypertension) 03/04/2024   Cervical radiculopathy 03/04/2024   Palpitations 03/04/2024   Loss of appetite 01/22/2024   Nausea without vomiting 01/22/2024   Chronic idiopathic constipation 01/22/2024   Osteoarthritis of left knee 04/07/2021   Degenerative arthritis of left knee 04/07/2021   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   Back pain 10/17/2015   Lumbar radiculopathy 10/17/2015   Lumbar degenerative disc disease 09/15/2015    Orientation RESPIRATION BLADDER Height & Weight     Self, Time, Situation, Place  Normal Continent Weight: 140 lb 1.3 oz (63.5 kg) Height:  5' 2 (157.5 cm)  BEHAVIORAL SYMPTOMS/MOOD NEUROLOGICAL BOWEL NUTRITION STATUS      Continent Diet (see dc summary)  AMBULATORY STATUS COMMUNICATION OF NEEDS Skin   Limited Assist Verbally Normal                       Personal Care Assistance Level of Assistance  Bathing, Feeding, Dressing Bathing Assistance: Limited assistance Feeding assistance: Independent Dressing Assistance: Limited assistance     Functional Limitations Info  Sight, Hearing, Speech Sight Info:  Adequate Hearing Info: Adequate Speech Info: Adequate    SPECIAL CARE FACTORS FREQUENCY  PT (By licensed PT), OT (By licensed OT)     PT Frequency: 5x/wk OT Frequency: 5x/wk            Contractures Contractures Info: Not present    Additional Factors Info  Code Status, Allergies Code Status Info: Full Code Allergies Info: Fentanyl   Ibuprofen  Zolpidem   Cyclobenzaprine   Diazepam   Morphine And Codeine  Zolpidem  Tartrate  Metaxalone  Meperidine  Morphine           Current Medications (07/15/2024):  This is the current hospital active medication list Current Facility-Administered Medications  Medication Dose Route Frequency Provider Last Rate Last Admin   HYDROmorphone  (DILAUDID ) injection 0.5 mg  0.5 mg Intravenous Q2H PRN Griselda Norris, MD   0.5 mg at 07/15/24 0411   oxyCODONE  (Oxy IR/ROXICODONE ) immediate release tablet 15 mg  15 mg Oral Q4H PRN Garrick Charleston, MD   15 mg at 07/15/24 1412   vancomycin  (VANCOREADY) IVPB 1250 mg/250 mL  1,250 mg Intravenous Once Bryk, Veronda P, [facility]       Current Outpatient Medications  Medication Sig Dispense Refill   amLODipine  (NORVASC ) 5 MG tablet Take 2.5 mg by mouth daily.     apixaban  (ELIQUIS ) 5 MG TABS tablet Take 2 tablets (10 mg total) by mouth 2 (two) times daily for 5 days, THEN 1 tablet (5 mg total) 2 (two) times daily. 200 tablet 0   Cyanocobalamin  (B-12 PO) Take 1 tablet by mouth daily.  dexmethylphenidate  (FOCALIN ) 10 MG tablet Take 10 mg by mouth 2 (two) times daily. Do not take after 2 pm     Ferrous Sulfate (IRON PO) Take 1 tablet by mouth daily.     fluticasone  (FLONASE ) 50 MCG/ACT nasal spray Place 1-2 sprays into both nostrils daily as needed for allergies.     levothyroxine  (SYNTHROID , LEVOTHROID) 100 MCG tablet Take 100 mcg by mouth daily.     LINZESS  72 MCG capsule Take 72 mcg by mouth daily before breakfast.     loratadine  (CLARITIN ) 10 MG tablet Take 10 mg by mouth daily as needed for allergies.       methadone  (DOLOPHINE ) 10 MG tablet Take 20 mg by mouth 2 (two) times daily.     nitrofurantoin, macrocrystal-monohydrate, (MACROBID) 100 MG capsule Take 100 mg by mouth 2 (two) times daily. (Patient not taking: Reported on 07/12/2024)     ondansetron  (ZOFRAN ) 4 MG tablet Take 1 tablet (4 mg total) by mouth every 6 (six) hours as needed for nausea. 20 tablet 0   oxyCODONE  (ROXICODONE ) 15 MG immediate release tablet Take 15 mg by mouth every 4 (four) hours as needed for pain. Max daily amount 90 mg     pantoprazole  (PROTONIX ) 40 MG tablet Take 1 tablet (40 mg total) by mouth daily. 60 tablet 3   polyethylene glycol powder (GLYCOLAX /MIRALAX ) 17 GM/SCOOP powder Take 17 g by mouth daily.     pregabalin  (LYRICA ) 100 MG capsule Take 100 mg by mouth 2 (two) times daily.     propranolol  ER (INDERAL  LA) 120 MG 24 hr capsule Take 120 mg by mouth daily.     sulfamethoxazole -trimethoprim  (BACTRIM  DS) 800-160 MG tablet Take 1 tablet by mouth 2 (two) times daily. 8 tablet 0   tamsulosin  (FLOMAX ) 0.4 MG CAPS capsule Take 1 capsule (0.4 mg total) by mouth daily. 30 capsule 3   traZODone  (DESYREL ) 100 MG tablet Take 100 mg by mouth at bedtime.       Discharge Medications: Please see discharge summary for a list of discharge medications.  Relevant Imaging Results:  Relevant Lab Results:   Additional Information SSN [id number]  Sheri ONEIDA Sharps, LCSW     "

## 2024-07-16 LAB — CULTURE, BLOOD (ROUTINE X 2)
Culture: NO GROWTH
Culture: NO GROWTH
Special Requests: ADEQUATE

## 2024-07-16 LAB — URINE CULTURE: Culture: NO GROWTH

## 2024-07-16 NOTE — ED Notes (Signed)
 When you have time, Boby Lunger (niece) [phone] would like update on pt. Thank you

## 2024-07-16 NOTE — Progress Notes (Signed)
 Ins auth remains pending at this time. CSW will continue to monitor status.

## 2024-07-16 NOTE — Progress Notes (Signed)
 Skilled shara remains pending. TOC following.

## 2024-07-16 NOTE — ED Notes (Signed)
 Pt assisted to ambulate to the restroom using walker. Returned to bed without issue. Dentures placed in denture cup and placed in pt belonging bag.

## 2024-07-16 NOTE — Progress Notes (Signed)
" °   07/16/24 1354  TOC Barriers to Discharge  Barriers to Discharge ED SNF auth    "

## 2024-07-16 NOTE — ED Provider Notes (Signed)
 Emergency Medicine Observation Re-evaluation Note  Christine Burns is a 78 y.o. female, seen on rounds today.  Pt initially presented to the ED for complaints of Neck Pain and Hip Pain Currently, the patient is resting.  Physical Exam  BP (!) 140/76   Pulse 84   Temp 98.5 F (36.9 C) (Oral)   Resp 17   Ht 1.575 m (5' 2)   Wt 63.5 kg   SpO2 95%   BMI 25.62 kg/m  Physical Exam General: Adult female, resting Lungs: No increased work of breathing Psych: Calm  ED Course / MDM  EKG:EKG Interpretation Date/Time:  Sunday July 14 2024 21:07:31 EST Ventricular Rate:  106 PR Interval:    QRS Duration:  85 QT Interval:  356 QTC Calculation: 441 R Axis:   57  Text Interpretation: Sinus rhythm Ventricular bigeminy Confirmed by Yolande Charleston [phone]) on 07/14/2024 9:41:21 PM  I have reviewed the labs performed to date as well as medications administered while in observation.  Recent changes in the last 24 hours include Evaluation by social worker for assistance with placement.  Plan  Current plan is for placement.    Garrick Charleston, MD 07/16/24 [phone]

## 2024-07-16 NOTE — ED Notes (Signed)
 Patient resting quietly with eyes closed.  Call bell within reach.  Respirations even and unlabored

## 2024-07-16 NOTE — ED Notes (Signed)
Patient resting with eyes closed. Respirations even and unlabored.

## 2024-07-16 NOTE — ED Notes (Signed)
 Breathing even and unlabored. Call light within reach, encouraged to use when needs arise.

## 2024-07-16 NOTE — ED Notes (Signed)
Patient resting quietly with eyes closed.  Respirations even and unlabored.  °

## 2024-07-17 NOTE — Progress Notes (Signed)
 CSW met with patient at bedside. Provided review of efforts to achieve SNF placement and current situation as a whole. Discussed principle barrier, pending SNF auth. Patient able to verbalize understanding to process and barriers. Still wishing to pursue SNF in lieu of other dispositions. Auth remains pending. Holiday remains barrier. TOC following.

## 2024-07-17 NOTE — ED Notes (Signed)
 Patient ambulated to toilet and back to room using walker with no complications. New bed linens placed on stretcher, patient provided with a drink at this time. Patient denies any other needs. Stretcher locked and in lowest position.

## 2024-07-17 NOTE — Progress Notes (Addendum)
 CSW received call from [facility] w/ [facility] and [employer]. Patient has been offered P2P for SNF auth. P2P can be called to [phone], option 5. Deadline is 07/19/24 @ 1200. Will need name, DOB, and Member ID. Discussed with attending, provided information for P2P. Discussed with patient, who stated she was concerned about the prospect of returning home but understanding of the situation. Support provided. TOC following for P2P determination.

## 2024-07-17 NOTE — ED Notes (Addendum)
 Patient ambulated from ER stretcher to hospital bed, tolerated well. Patient is now resting comfortably.

## 2024-07-17 NOTE — ED Provider Notes (Signed)
 Emergency Medicine Observation Re-evaluation Note  Christine Burns is a 78 y.o. female, seen on rounds today.  Pt initially presented to the ED for complaints of Neck Pain and Hip Pain Currently, the patient is resting.  Physical Exam  BP 127/78 (BP Location: Right Arm)   Pulse 75   Temp 98.9 F (37.2 C) (Oral)   Resp 18   Ht 5' 2 (1.575 m)   Wt 63.5 kg   SpO2 93%   BMI 25.62 kg/m  Physical Exam General: Adult female, resting Lungs: No increased work of breathing Psych: Calm  ED Course / MDM  EKG:EKG Interpretation Date/Time:  Sunday July 14 2024 21:07:31 EST Ventricular Rate:  106 PR Interval:    QRS Duration:  85 QT Interval:  356 QTC Calculation: 441 R Axis:   57  Text Interpretation: Sinus rhythm Ventricular bigeminy Confirmed by Yolande Charleston [phone]) on 07/14/2024 9:41:21 PM  I have reviewed the labs performed to date as well as medications administered while in observation.  Recent changes in the last 24 hours include Evaluation by social worker for assistance with placement.  Plan  Current plan is for placement.    Franklyn Sid SAILOR, MD 07/17/24 [phone]

## 2024-07-17 NOTE — Progress Notes (Signed)
 Auth remains pending, expected delays due to holiday. TOC following.

## 2024-07-17 NOTE — Progress Notes (Signed)
 Physical Therapy Treatment Patient Details Name: Christine Burns MRN: [id number] DOB: [birth year] Today's Date: 07/17/2024   History of Present Illness The pt is a 78 yo female presenting 12/21 with neck pain, CP, and SOB. Recent ED visits on 12/18 for SOB and hip pain after a fall, found to have acute PE.  PMH includes: frequent falls, chronic pain on methadone , fibromyalgia, PE on Eliquis , spinal fusion C6-T2 on 12/15 (at [facility], d/c home on 12/17).    PT Comments  Pt making slow, steady progress. Balance and mobility remain impaired placing pt at risk for falls. Continue to fell patient will benefit from continued inpatient follow up therapy, <3 hours/day.    If plan is discharge home, recommend the following: A little help with walking and/or transfers;A little help with bathing/dressing/bathroom;Assistance with cooking/housework;Assist for transportation;Supervision due to cognitive status;Help with stairs or ramp for entrance   Can travel by private vehicle     Yes  Equipment Recommendations  BSC/3in1 (wheelchair but pt states she has one from a friend)    Recommendations for Other Services       Precautions / Restrictions Precautions Precautions: Fall;Cervical Precaution Booklet Issued: No Recall of Precautions/Restrictions: Intact Required Braces or Orthoses: Cervical Brace Cervical Brace: Soft collar Restrictions Weight Bearing Restrictions Per Provider Order: No     Mobility  Bed Mobility Overal bed mobility: Needs Assistance Bed Mobility: Rolling, Sidelying to Sit, Sit to Sidelying Rolling: Contact guard assist Sidelying to sit: Contact guard assist     Sit to sidelying: Min assist General bed mobility comments: Assist to bring legs back up into bed    Transfers Overall transfer level: Needs assistance Equipment used: Rolling walker (2 wheels) Transfers: Sit to/from Stand Sit to Stand: Contact guard assist           General transfer comment: Assist for  safety    Ambulation/Gait Ambulation/Gait assistance: Contact guard assist Gait Distance (Feet): 150 Feet Assistive device: Rolling walker (2 wheels) Gait Pattern/deviations: Step-through pattern, Decreased stride length, Drifts right/left, Trunk flexed Gait velocity: decreased Gait velocity interpretation: 1.31 - 2.62 ft/sec, indicative of limited community ambulator   General Gait Details: Incr lateral trunk sway   Stairs             Wheelchair Mobility     Tilt Bed    Modified Rankin (Stroke Patients Only)       Balance Overall balance assessment: Needs assistance Sitting-balance support: No upper extremity supported, Feet supported Sitting balance-Leahy Scale: Good     Standing balance support: Bilateral upper extremity supported, During functional activity, No upper extremity supported Standing balance-Leahy Scale: Fair                              Hotel Manager: No apparent difficulties  Cognition Arousal: Alert Behavior During Therapy: WFL for tasks assessed/performed   PT - Cognitive impairments: No family/caregiver present to determine baseline, Awareness, Safety/Judgement, Memory, Problem solving                         Following commands: Intact      Cueing Cueing Techniques: Verbal cues  Exercises Other Exercises Other Exercises: Standing and reaching without UE support Other Exercises: Forward/backward stepping  without UE support    General Comments        Pertinent Vitals/Pain Pain Assessment Pain Assessment: 0-10 Pain Score: 10-Worst pain ever Pain Location: neck Pain Descriptors /  Indicators: Discomfort, Grimacing Pain Intervention(s): Limited activity within patient's tolerance, Premedicated before session, Repositioned    Home Living                          Prior Function            PT Goals (current goals can now be found in the care plan section) Acute  Rehab PT Goals Patient Stated Goal: to return home Progress towards PT goals: Progressing toward goals    Frequency    Min 2X/week      PT Plan      Co-evaluation              AM-PAC PT 6 Clicks Mobility   Outcome Measure  Help needed turning from your back to your side while in a flat bed without using bedrails?: A Little Help needed moving from lying on your back to sitting on the side of a flat bed without using bedrails?: A Little Help needed moving to and from a bed to a chair (including a wheelchair)?: A Little Help needed standing up from a chair using your arms (e.g., wheelchair or bedside chair)?: A Little Help needed to walk in hospital room?: A Little Help needed climbing 3-5 steps with a railing? : A Lot 6 Click Score: 17    End of Session Equipment Utilized During Treatment: Gait belt Activity Tolerance: Patient tolerated treatment well Patient left: in bed Nurse Communication: Mobility status PT Visit Diagnosis: Unsteadiness on feet (R26.81);Repeated falls (R29.6);Muscle weakness (generalized) (M62.81)     Time: 9097-9082 PT Time Calculation (min) (ACUTE ONLY): 15 min  Charges:    $Gait Training: 8-22 mins PT General Charges $$ ACUTE PT VISIT: 1 Visit                     [facility] PT Acute Rehabilitation Services Office [phone]    Rodgers ORN [facility] 07/17/2024, 9:55 AM

## 2024-07-17 NOTE — ED Notes (Signed)
 Pt given ginger ale to drink. Pt is resting at this time and denies any further needs.

## 2024-07-17 NOTE — ED Notes (Signed)
 Pt ambulated to the bathroom with a walk with no complication. Pt back in bed. Family at bedside.

## 2024-07-19 ENCOUNTER — Encounter (HOSPITAL_COMMUNITY): Payer: Self-pay

## 2024-07-19 LAB — CULTURE, BLOOD (ROUTINE X 2)
Culture: NO GROWTH
Culture: NO GROWTH
Special Requests: ADEQUATE
Special Requests: ADEQUATE

## 2024-07-19 MED ORDER — HYDROMORPHONE HCL 1 MG/ML IJ SOLN
0.5000 mg | INTRAMUSCULAR | Status: DC | PRN
  Administered 2024-07-19: 0.5 mg via INTRAMUSCULAR
  Filled 2024-07-19 (×2): qty 1

## 2024-07-19 NOTE — Progress Notes (Addendum)
 P2P completed; insurance decision to deny remains. CSW notified patient and patients niece, Boby, that ins shara was denied and patient will dc home with [facility] services. CSW added resources for caregiving resources to AVS. Niece, Boby, stated she is able to transport patient home when she gets off work at [employer]. Team notified to prepare for [employer].

## 2024-07-19 NOTE — ED Notes (Signed)
 PT's niece picke dup PT and PT was wheeled out to the car by [employer]

## 2024-07-19 NOTE — ED Notes (Signed)
 Pt is now complaining with right knee pain 10/10. States it is awakening her from being able to sleep.

## 2024-07-19 NOTE — ED Provider Notes (Addendum)
 Emergency Medicine Observation Re-evaluation Note  Christine Burns is a 78 y.o. female, seen on rounds today.  Pt initially presented to the ED for complaints of Neck Pain and Hip Pain Currently, the patient is sleeping.  Physical Exam  BP 124/81 (BP Location: Right Arm)   Pulse 73   Temp 98.6 F (37 C)   Resp 16   Ht 5' 2 (1.575 m)   Wt 63.5 kg   SpO2 93%   BMI 25.62 kg/m  Physical Exam General: NAD Cardiac: Regular HR Lungs: No resp distress Psych: Stable  ED Course / MDM  EKG:EKG Interpretation Date/Time:  Sunday July 14 2024 21:07:31 EST Ventricular Rate:  106 PR Interval:    QRS Duration:  85 QT Interval:  356 QTC Calculation: 441 R Axis:   57  Text Interpretation: Sinus rhythm Ventricular bigeminy Confirmed by Yolande Charleston [phone]) on 07/14/2024 9:41:21 PM  I have reviewed the labs performed to date as well as medications administered while in observation.   Plan  Current plan is for [facility] assistance for SNF placement  Pain medication adjustment - I've discontinued Q2 IM dilaudid .  Patient is on Q4 PRN oxycodone  15 mg; 20 mg daily methadone , and lyrica  BID already.    Cottie Donnice PARAS, MD 07/19/24 9190    Cottie Donnice PARAS, MD 07/19/24 [phone]

## 2024-07-19 NOTE — ED Provider Notes (Signed)
 I spoke to Dr Lenora for peer to peer insurance who has issued a preliminary denial of request for authorization for SNF, given patient's functionality noted on recent PT evaluations this week.   Cottie Donnice PARAS, MD 07/19/24 [phone]

## 2024-07-19 NOTE — ED Provider Notes (Signed)
 Home face to face ordered for home PT/OT and aide.  TOC discussed with pt and family inability to qualify for SNF per insurance.  We'll see if we can assist home health starting Monday. Niece to come get patient at 4 pm today.     Cottie Donnice PARAS, MD 07/19/24 [phone]

## 2024-07-19 NOTE — ED Notes (Addendum)
 Patient was at Nurses station asking for something else for pain. I pulled her dilaudid , once I opened the packaging, to scan it, I drew it up because she was only suppose to get 0.5. When I went to administer it and she then tells me her iv fell out . So I am trying to get a hold of a dr to see if they will change the administration to IM. Until then, I have wasted the dose in the secure container until I see if she can get a new order.  She said she does not want another IV at this time. I also do not have another Nurse over here with me to waste it in pyxis, Once I find a free nurse that has time, I will waste with them and will continue to try to see what provider is seeing this patient.

## 2024-07-19 NOTE — Progress Notes (Signed)
 P2P pending. P2P information provided to MD, [phone], option 5. Deadline is 07/19/24 @ 12pm.

## 2024-07-19 NOTE — Discharge Planning (Signed)
 Transition of Care [facility]) - Emergency Department Mini Assessment   Patient Details  Name: Christine Burns MRN: [id number] Date of Birth: [birth year]  Transition of Care [facility]) CM/SW Contact:    Debarah Saunas, RN Phone Number: 07/19/2024, 9:53 AM   Clinical Narrative: RNCM spoke with pt at bedside regarding discharge planning for Home Health Services. Offered pt medicare.gov list of home health agencies to choose from.  Pt chose [facility] to render RN and PT services. Artayvia, liaison of [facility] notified. Patient made aware that [facility] will be in contact in 24-48 hours. HH agency information placed on After Visit Summary (AVS) for pt to contact with any home health questions after discharge. No DME needs identified at this time. Kortne All J. Debarah, BSN, RN, [facility]TA[facility] [phone]    ED Mini Assessment: What brought you to the Emergency Department? : frequent falls  Barriers to Discharge: Barriers Resolved     Means of departure: Car  Interventions which prevented an admission or readmission: Home Health Consult or Services    Patient Contact and Communications        ,          Patient states their goals for this hospitalization and ongoing recovery are:: to go back home since I can't go to nursing home. CMS Medicare.gov Compare Post Acute Care list provided to:: Patient Choice offered to / list presented to : Patient  Admission diagnosis:  neck pain Patient Active Problem List   Diagnosis Date Noted   Acute pulmonary embolism (HCC) 07/11/2024   HTN (hypertension) 03/04/2024   Cervical radiculopathy 03/04/2024   Palpitations 03/04/2024   Loss of appetite 01/22/2024   Nausea without vomiting 01/22/2024   Chronic idiopathic constipation 01/22/2024   Osteoarthritis of left knee 04/07/2021   Degenerative arthritis of left knee 04/07/2021   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   Back pain 10/17/2015   Lumbar radiculopathy 10/17/2015   Lumbar degenerative disc  disease 09/15/2015   PCP:  [name], MD Pharmacy:   [facility] - [place], [place] - [address] [place] [address] Phone: [phone] Fax: [phone]

## 2024-07-19 NOTE — ED Notes (Signed)
 Patient called out for her dilaudid , pulled it, drew it up so I could waste in pyxis with another RN. Wasted the 0.5, took into patients room. She stated she did not want it now, she would rather just wait to take her pain pill. Brought out to [employer] [facility] [employer], [facility]. We wasted the other 0.5 in pyxis.

## 2024-07-19 NOTE — ED Notes (Signed)
 Spoke with ER PA, she changed order from IV to IM, will waste other with nurse and pull one for correct administration.
